# Patient Record
Sex: Female | Born: 1990 | Race: Black or African American | Hispanic: No | Marital: Single | Smoking: Never smoker
Health system: Southern US, Community
[De-identification: ages and names within clinical notes are randomized; demographics above are authoritative.]

## PROBLEM LIST (undated history)

## (undated) ENCOUNTER — Inpatient Hospital Stay (HOSPITAL_COMMUNITY): Payer: Self-pay

## (undated) DIAGNOSIS — N83209 Unspecified ovarian cyst, unspecified side: Secondary | ICD-10-CM

## (undated) DIAGNOSIS — A549 Gonococcal infection, unspecified: Secondary | ICD-10-CM

## (undated) DIAGNOSIS — O1490 Unspecified pre-eclampsia, unspecified trimester: Secondary | ICD-10-CM

## (undated) DIAGNOSIS — O09299 Supervision of pregnancy with other poor reproductive or obstetric history, unspecified trimester: Secondary | ICD-10-CM

## (undated) DIAGNOSIS — D75839 Thrombocytosis, unspecified: Secondary | ICD-10-CM

## (undated) DIAGNOSIS — K219 Gastro-esophageal reflux disease without esophagitis: Secondary | ICD-10-CM

## (undated) DIAGNOSIS — D473 Essential (hemorrhagic) thrombocythemia: Secondary | ICD-10-CM

## (undated) HISTORY — PX: NO PAST SURGERIES: SHX2092

## (undated) HISTORY — DX: Essential (hemorrhagic) thrombocythemia: D47.3

## (undated) HISTORY — DX: Thrombocytosis, unspecified: D75.839

---

## 2010-02-21 ENCOUNTER — Emergency Department (HOSPITAL_COMMUNITY)
Admission: EM | Admit: 2010-02-21 | Discharge: 2010-02-21 | Disposition: A | Discharge status: Home / Self Care | Payer: Self-pay | Attending: Emergency Medicine | Admitting: Emergency Medicine

## 2010-02-21 ENCOUNTER — Emergency Department (HOSPITAL_COMMUNITY): Payer: Self-pay

## 2010-02-21 DIAGNOSIS — R0602 Shortness of breath: Secondary | ICD-10-CM | POA: Insufficient documentation

## 2010-02-21 DIAGNOSIS — N390 Urinary tract infection, site not specified: Secondary | ICD-10-CM | POA: Insufficient documentation

## 2010-02-21 DIAGNOSIS — R079 Chest pain, unspecified: Secondary | ICD-10-CM | POA: Insufficient documentation

## 2010-02-21 DIAGNOSIS — R42 Dizziness and giddiness: Secondary | ICD-10-CM | POA: Insufficient documentation

## 2010-02-21 DIAGNOSIS — R55 Syncope and collapse: Secondary | ICD-10-CM | POA: Insufficient documentation

## 2010-02-21 DIAGNOSIS — M79609 Pain in unspecified limb: Secondary | ICD-10-CM | POA: Insufficient documentation

## 2010-02-21 LAB — CBC
Hemoglobin: 12.6 g/dL (ref 12.0–15.0)
Platelets: 456 10*3/uL — ABNORMAL HIGH (ref 150–400)
RBC: 4.66 MIL/uL (ref 3.87–5.11)
WBC: 7.3 10*3/uL (ref 4.0–10.5)

## 2010-02-21 LAB — URINE MICROSCOPIC-ADD ON

## 2010-02-21 LAB — DIFFERENTIAL
Basophils Absolute: 0 10*3/uL (ref 0.0–0.1)
Basophils Relative: 0 % (ref 0–1)
Eosinophils Absolute: 0.1 10*3/uL (ref 0.0–0.7)
Neutro Abs: 4.3 10*3/uL (ref 1.7–7.7)
Neutrophils Relative %: 59 % (ref 43–77)

## 2010-02-21 LAB — D-DIMER, QUANTITATIVE: D-Dimer, Quant: 0.22 ug/mL-FEU (ref 0.00–0.48)

## 2010-02-21 LAB — URINALYSIS, ROUTINE W REFLEX MICROSCOPIC
Bilirubin Urine: NEGATIVE
Nitrite: NEGATIVE
Specific Gravity, Urine: 1.027 (ref 1.005–1.030)
Urobilinogen, UA: 0.2 mg/dL (ref 0.0–1.0)

## 2010-02-21 LAB — POCT I-STAT, CHEM 8
Calcium, Ion: 1.28 mmol/L (ref 1.12–1.32)
Chloride: 104 mEq/L (ref 96–112)
HCT: 42 % (ref 36.0–46.0)
Sodium: 136 mEq/L (ref 135–145)
TCO2: 24 mmol/L (ref 0–100)

## 2010-02-21 LAB — POCT PREGNANCY, URINE: Preg Test, Ur: NEGATIVE

## 2010-02-22 LAB — URINE CULTURE

## 2010-02-24 ENCOUNTER — Emergency Department (HOSPITAL_BASED_OUTPATIENT_CLINIC_OR_DEPARTMENT_OTHER)
Admission: EM | Admit: 2010-02-24 | Discharge: 2010-02-24 | Disposition: A | Discharge status: Home / Self Care | Payer: Self-pay | Attending: Emergency Medicine | Admitting: Emergency Medicine

## 2010-02-24 DIAGNOSIS — F410 Panic disorder [episodic paroxysmal anxiety] without agoraphobia: Secondary | ICD-10-CM | POA: Insufficient documentation

## 2010-08-29 ENCOUNTER — Emergency Department (HOSPITAL_COMMUNITY): Payer: Self-pay

## 2010-08-29 ENCOUNTER — Emergency Department (HOSPITAL_COMMUNITY)
Admission: EM | Admit: 2010-08-29 | Discharge: 2010-08-30 | Disposition: A | Discharge status: Home / Self Care | Payer: Self-pay | Attending: Emergency Medicine | Admitting: Emergency Medicine

## 2010-08-29 DIAGNOSIS — X58XXXA Exposure to other specified factors, initial encounter: Secondary | ICD-10-CM | POA: Insufficient documentation

## 2010-08-29 DIAGNOSIS — M25519 Pain in unspecified shoulder: Secondary | ICD-10-CM | POA: Insufficient documentation

## 2010-08-29 DIAGNOSIS — IMO0002 Reserved for concepts with insufficient information to code with codable children: Secondary | ICD-10-CM | POA: Insufficient documentation

## 2010-10-24 ENCOUNTER — Emergency Department (HOSPITAL_COMMUNITY): Payer: Self-pay

## 2010-10-24 ENCOUNTER — Emergency Department (HOSPITAL_COMMUNITY)
Admission: EM | Admit: 2010-10-24 | Discharge: 2010-10-25 | Disposition: A | Discharge status: Home / Self Care | Payer: Self-pay | Attending: Emergency Medicine | Admitting: Emergency Medicine

## 2010-10-24 DIAGNOSIS — R079 Chest pain, unspecified: Secondary | ICD-10-CM | POA: Insufficient documentation

## 2010-10-24 DIAGNOSIS — R55 Syncope and collapse: Secondary | ICD-10-CM | POA: Insufficient documentation

## 2010-10-24 DIAGNOSIS — F411 Generalized anxiety disorder: Secondary | ICD-10-CM | POA: Insufficient documentation

## 2010-10-25 LAB — POCT PREGNANCY, URINE: Preg Test, Ur: NEGATIVE

## 2011-04-24 ENCOUNTER — Emergency Department (INDEPENDENT_AMBULATORY_CARE_PROVIDER_SITE_OTHER)
Admission: EM | Admit: 2011-04-24 | Discharge: 2011-04-24 | Disposition: A | Discharge status: Home / Self Care | Payer: Self-pay | Source: Home / Self Care | Attending: Emergency Medicine | Admitting: Emergency Medicine

## 2011-04-24 ENCOUNTER — Encounter (HOSPITAL_COMMUNITY): Payer: Self-pay | Admitting: Emergency Medicine

## 2011-04-24 DIAGNOSIS — IMO0001 Reserved for inherently not codable concepts without codable children: Secondary | ICD-10-CM

## 2011-04-24 DIAGNOSIS — IMO0002 Reserved for concepts with insufficient information to code with codable children: Secondary | ICD-10-CM

## 2011-04-24 DIAGNOSIS — E1165 Type 2 diabetes mellitus with hyperglycemia: Secondary | ICD-10-CM

## 2011-04-24 DIAGNOSIS — K529 Noninfective gastroenteritis and colitis, unspecified: Secondary | ICD-10-CM

## 2011-04-24 DIAGNOSIS — K5289 Other specified noninfective gastroenteritis and colitis: Secondary | ICD-10-CM

## 2011-04-24 HISTORY — DX: Gastro-esophageal reflux disease without esophagitis: K21.9

## 2011-04-24 LAB — POCT I-STAT, CHEM 8
Calcium, Ion: 1.24 mmol/L (ref 1.12–1.32)
Glucose, Bld: 198 mg/dL — ABNORMAL HIGH (ref 70–99)
HCT: 38 % (ref 36.0–46.0)
Hemoglobin: 12.9 g/dL (ref 12.0–15.0)
Potassium: 3.9 mEq/L (ref 3.5–5.1)

## 2011-04-24 LAB — POCT URINALYSIS DIP (DEVICE)
Bilirubin Urine: NEGATIVE
Nitrite: NEGATIVE
pH: 7 (ref 5.0–8.0)

## 2011-04-24 MED ORDER — CIPROFLOXACIN HCL 500 MG PO TABS: 500.0000 mg | ORAL_TABLET | Freq: Two times a day (BID) | ORAL | Status: AC

## 2011-04-24 MED ORDER — GLIMEPIRIDE 1 MG PO TABS: 1.0000 mg | ORAL_TABLET | Freq: Every day | ORAL | Status: DC

## 2011-04-24 NOTE — ED Provider Notes (Signed)
Chief Complaint  Patient presents with  . Abdominal Pain  . GI Problem    History of Present Illness:   Julia Willis is a 21 year old female who has had a one-month history of generalized abdominal pain and diarrhea. The pain comes and goes and can last for 5-10 minutes at a time. It is rated as 7/10 in intensity. It feels like a stabbing or sharp. It's been worse the past week. The pain is generalized and is not local localized. It is worse after eating and better after a bowel movement or emptying her bladder. Also for the past week she's felt nauseated and vomited once. No blood or bilious vomitus. Her appetite is good and her weight is stable. She's had a one-month history of diarrheal stools with about 3 loose stools per day without blood or mucus. No melena. She denies any fevers, chills, anorexia, or weight loss. She has had no urinary symptoms. No GYN symptoms. Last menstrual period was March 20. She is sexually active and uses condoms although not consistently. She has had no suspicious ingestions, foreign travel, animal exposure, recent antibiotic use.  There is a family history of diabetes. She denies any polyuria, polydipsia, blurry vision, drastic changes in her weight issue of weight loss or weight gain, skin problems, or vaginitis.  Review of Systems:  Other than noted above, the patient denies any of the following symptoms: Constitutional:  No fever, chills, fatigue, weight loss or anorexia. Lungs:  No cough or shortness of breath. Heart:  No chest pain, palpitations, syncope or edema. Abdomen:  No nausea, vomiting, hematememesis, melena, diarrhea, or hematochezia. GU:  No dysuria, frequency, urgency, or hematuria. Gyn:  No vaginal discharge, itching, abnormal bleeding or pelvic pain. Skin:  No rash or itching.  PMFSH:  Past medical history, family history, social history, meds, and allergies were reviewed.  Physical Exam:   Vital signs:  BP 133/84  Pulse 78  Temp(Src) 97.8 F (36.6  C) (Oral)  Resp 14  SpO2 100%  LMP 03/29/2011 Gen:  Alert, oriented, in no distress. Lungs:  Breath sounds clear and equal bilaterally.  No wheezes, rales or rhonchi. Heart:  Regular rhythm.  No gallops or murmers.   Abdomen:  Abdomen is flat, soft, nondistended she has mild generalized tenderness to palpation without guarding or rebound. There is no localizing tenderness to palpation. No organomegaly or mass. Bowel sounds are hyperactive. Skin:  Clear, warm and dry.  No rash.  Labs:   Results for orders placed during the hospital encounter of 04/24/11  POCT URINALYSIS DIP (DEVICE)      Component Value Range   Glucose, UA >=1000 (*) NEGATIVE (mg/dL)   Bilirubin Urine NEGATIVE  NEGATIVE    Ketones, ur NEGATIVE  NEGATIVE (mg/dL)   Specific Gravity, Urine 1.020  1.005 - 1.030    Hgb urine dipstick SMALL (*) NEGATIVE    pH 7.0  5.0 - 8.0    Protein, ur NEGATIVE  NEGATIVE (mg/dL)   Urobilinogen, UA 2.0 (*) 0.0 - 1.0 (mg/dL)   Nitrite NEGATIVE  NEGATIVE    Leukocytes, UA TRACE (*) NEGATIVE   POCT PREGNANCY, URINE      Component Value Range   Preg Test, Ur NEGATIVE  NEGATIVE   POCT I-STAT, CHEM 8      Component Value Range   Sodium 138  135 - 145 (mEq/L)   Potassium 3.9  3.5 - 5.1 (mEq/L)   Chloride 102  96 - 112 (mEq/L)   BUN 5 (*) 6 -  23 (mg/dL)   Creatinine, Ser 1.61  0.50 - 1.10 (mg/dL)   Glucose, Bld 096 (*) 70 - 99 (mg/dL)   Calcium, Ion 0.45  4.09 - 1.32 (mmol/L)   TCO2 26  0 - 100 (mmol/L)   Hemoglobin 12.9  12.0 - 15.0 (g/dL)   HCT 81.1  91.4 - 78.2 (%)    Other Labs Obtained at Urgent Care Center:  A stool culture will be obtained as well as Clostridium difficile PCR.  Results are pending at this time and we will call about any positive results.  Assessment:  The primary encounter diagnosis was Colitis. A diagnosis of Diabetes mellitus type 2, uncontrolled was also pertinent to this visit.  I think she has either an infectious colitis caused by bacterial infection such  as Salmonella, Shigella, Escherichia coli, Yersinia, or Campylobacter, or inflammatory bowel disease. Right now her diabetes is asymptomatic and is an incidental finding and unrelated to her current symptoms.  Plan:   1.  The following meds were prescribed:   New Prescriptions   CIPROFLOXACIN (CIPRO) 500 MG TABLET    Take 1 tablet (500 mg total) by mouth every 12 (twelve) hours.   GLIMEPIRIDE (AMARYL) 1 MG TABLET    Take 1 tablet (1 mg total) by mouth daily before breakfast.   2.  The patient was instructed in symptomatic care and handouts were given. 3.  The patient was told to return if becoming worse in any way, if no better in 3 or 4 days, and given some red flag symptoms that would indicate earlier return.  Follow up:  The patient was told to follow up with Dr. Loreta Ave if her abdominal pain and diarrhea have not completely gone away in 10 days. She was also urged to follow up with a primary care physician as soon as possible with regard to her diabetes. She was told to avoid sugar, sweets, and starch foods in the meantime.    Reuben Likes, MD 04/24/11 [phone]

## 2011-04-24 NOTE — Discharge Instructions (Signed)

## 2011-04-24 NOTE — ED Notes (Signed)
PT HERE WITH SHARP SHOOTING INTERMITT PAIN IN MIDDLE ABD RADIATING TO LOWER AREA X 1WEEK WITH N/V/D.DENIES VAG D/C OR UTI SX.PT STATES AFTER EATING STOMACH QUEASY WITH VOMITING.HX ACID REFLUX.LMP 03/29/11

## 2011-04-24 NOTE — ED Notes (Signed)
Patient unable to give urine sample;  Pt given cup of water 

## 2011-06-10 DIAGNOSIS — A549 Gonococcal infection, unspecified: Secondary | ICD-10-CM

## 2011-06-10 HISTORY — DX: Gonococcal infection, unspecified: A54.9

## 2011-07-12 ENCOUNTER — Emergency Department (HOSPITAL_COMMUNITY)
Admission: EM | Admit: 2011-07-12 | Discharge: 2011-07-13 | Disposition: A | Discharge status: Home / Self Care | Payer: Self-pay | Attending: Emergency Medicine | Admitting: Emergency Medicine

## 2011-07-12 ENCOUNTER — Encounter (HOSPITAL_COMMUNITY): Payer: Self-pay | Admitting: *Deleted

## 2011-07-12 DIAGNOSIS — Z202 Contact with and (suspected) exposure to infections with a predominantly sexual mode of transmission: Secondary | ICD-10-CM | POA: Insufficient documentation

## 2011-07-12 DIAGNOSIS — A64 Unspecified sexually transmitted disease: Secondary | ICD-10-CM

## 2011-07-12 DIAGNOSIS — A599 Trichomoniasis, unspecified: Secondary | ICD-10-CM

## 2011-07-12 DIAGNOSIS — Z79899 Other long term (current) drug therapy: Secondary | ICD-10-CM | POA: Insufficient documentation

## 2011-07-12 DIAGNOSIS — E119 Type 2 diabetes mellitus without complications: Secondary | ICD-10-CM | POA: Insufficient documentation

## 2011-07-12 LAB — POCT PREGNANCY, URINE: Preg Test, Ur: NEGATIVE

## 2011-07-12 MED ORDER — CEFTRIAXONE SODIUM 250 MG IJ SOLR: 250.0000 mg | Freq: Once | INTRAMUSCULAR | Status: DC

## 2011-07-12 MED ORDER — AZITHROMYCIN 250 MG PO TABS: 1000.0000 mg | ORAL_TABLET | Freq: Once | ORAL | Status: DC

## 2011-07-12 NOTE — ED Notes (Signed)
Pt refusing medications.She is requesting to go home and follow up with her doctor for investigations and test.

## 2011-07-12 NOTE — ED Provider Notes (Signed)
History     CSN: [id number]  Arrival date & time 07/12/11  1927   First MD Initiated Contact with Patient 07/12/11 2218      Chief Complaint  Patient presents with  . Exposure to STD    (Consider location/radiation/quality/duration/timing/severity/associated sxs/prior treatment) Patient is a 21 y.o. female presenting with STD exposure. The history is provided by the patient. No language interpreter was used.  Exposure to STD This is a new problem. The current episode started today. Pertinent negatives include no fever, nausea, swollen glands or vomiting. Nothing aggravates the symptoms. She has tried nothing for the symptoms.   Exposed to gonorrhea 2 weeks ago. Would like treatment. [facility].  Past Medical History  Diagnosis Date  . Acid reflux   . Diabetes mellitus     History reviewed. No pertinent past surgical history.  No family history on file.  History  Substance Use Topics  . Smoking status: Never Smoker   . Smokeless tobacco: Not on file  . Alcohol Use: Yes    OB History    Grav Para Term Preterm Abortions TAB SAB Ect Mult Living                  Review of Systems  Constitutional: Negative.  Negative for fever.  HENT: Negative.   Eyes: Negative.   Respiratory: Negative.   Cardiovascular: Negative.   Gastrointestinal: Negative.  Negative for nausea and vomiting.  Genitourinary: Negative for dysuria, urgency, frequency, hematuria, flank pain, vaginal bleeding, vaginal discharge, difficulty urinating, genital sores and pelvic pain.  Neurological: Negative.   Psychiatric/Behavioral: Negative.   All other systems reviewed and are negative.    Allergies  Review of patient's allergies indicates no known allergies.  Home Medications   Current Outpatient Rx  Name Route Sig Dispense Refill  . METFORMIN HCL 500 MG PO TABS Oral Take 500 mg by mouth 2 (two) times daily with a meal.      BP 131/97  Pulse 79  Temp 97.9 F (36.6 C) (Oral)   Resp 16  SpO2 100%  LMP 07/06/2011  Physical Exam  Nursing note and vitals reviewed. Constitutional: She is oriented to person, place, and time. She appears well-developed and well-nourished.  HENT:  Head: Normocephalic and atraumatic.  Eyes: Conjunctivae and EOM are normal. Pupils are equal, round, and reactive to light.  Neck: Normal range of motion. Neck supple.  Cardiovascular: Normal rate.   Pulmonary/Chest: Effort normal.  Abdominal: Soft.  Genitourinary: There is erythema and tenderness around the vagina. Vaginal discharge found.  Musculoskeletal: Normal range of motion. She exhibits no edema and no tenderness.  Neurological: She is alert and oriented to person, place, and time. She has normal reflexes.  Skin: Skin is warm and dry.  Psychiatric: She has a normal mood and affect.    ED Course  Pelvic exam Performed by: Remi Haggard Authorized by: Remi Haggard  Pelvic exam Date/Time: 07/12/2011 11:33 PM Performed by: Remi Haggard Authorized by: Remi Haggard Consent: Verbal consent obtained. Risks and benefits: risks, benefits and alternatives were discussed Consent given by: patient Patient understanding: patient states understanding of the procedure being performed Patient identity confirmed: verbally with patient, arm band and provided demographic data Local anesthesia used: no Patient sedated: no Patient tolerance: Patient tolerated the procedure well with no immediate complications.   (including critical care time) Patient is very undecided about what she wants done in the ER today. States that she was exposed to gonorrhea but does not want  to be treated for it. She stated she did not want a pelvic exam she just wanted to be treated now she saying she doesn't want to be treated until she has a pelvic exam. The test came back positive for Trichomonas. She did agree to being treated for Trichomonas but does not want to be treated for gonorrhea. States that  she'll wait for the test to come back before she was treated for anything else. Labs Reviewed - No data to display No results found.   No diagnosis found.    MDM  Treated for std trich only at patients request.  gc chlamydia pending. Will get call if+.  [facility] days.  Refused Rocephen and azith/doxy rx.     Labs Reviewed  WET PREP, GENITAL - Abnormal; Notable for the following:    Yeast Wet Prep HPF POC FEW (*)     Trich, Wet Prep MANY (*)     Clue Cells Wet Prep HPF POC FEW (*)     WBC, Wet Prep HPF POC FEW (*)     All other components within normal limits  POCT PREGNANCY, URINE  GC/CHLAMYDIA PROBE AMP, GENITAL         Remi Haggard, NP 07/13/11 1836

## 2011-07-12 NOTE — ED Notes (Addendum)
Denies sx. Reports was told she may have been exposed to gonorrhea. Wants to be checked. (Denies: pain, urinary sx vaginal sx, bleeding, fever, nvd, dizziness, itching, abd pain, back pain or any other sx).

## 2011-07-12 NOTE — ED Notes (Signed)
Pt went to general waiting after I spoke with her

## 2011-07-13 LAB — WET PREP, GENITAL

## 2011-07-13 MED ORDER — METRONIDAZOLE 500 MG PO TABS
2000.0000 mg | ORAL_TABLET | Freq: Once | ORAL | Status: AC
  Administered 2011-07-13: 2000 mg via ORAL
  Filled 2011-07-13: qty 1
  Filled 2011-07-13: qty 3

## 2011-07-13 MED ORDER — ONDANSETRON 4 MG PO TBDP
4.0000 mg | ORAL_TABLET | Freq: Once | ORAL | Status: AC
  Administered 2011-07-13: 4 mg via ORAL
  Filled 2011-07-13: qty 1

## 2011-07-13 NOTE — ED Notes (Signed)
Pt discharged home.GCS 15 

## 2011-07-17 NOTE — ED Provider Notes (Signed)
Medical screening examination/treatment/procedure(s) were performed by non-physician practitioner and as supervising physician I was immediately available for consultation/collaboration.  Geoffery Lyons, MD 07/17/11 [phone]

## 2011-07-18 ENCOUNTER — Encounter (HOSPITAL_COMMUNITY): Payer: Self-pay | Admitting: *Deleted

## 2011-07-18 ENCOUNTER — Emergency Department (HOSPITAL_COMMUNITY)
Admission: EM | Admit: 2011-07-18 | Discharge: 2011-07-19 | Disposition: A | Discharge status: Home / Self Care | Payer: Self-pay | Attending: Emergency Medicine | Admitting: Emergency Medicine

## 2011-07-18 DIAGNOSIS — A64 Unspecified sexually transmitted disease: Secondary | ICD-10-CM | POA: Insufficient documentation

## 2011-07-18 DIAGNOSIS — E119 Type 2 diabetes mellitus without complications: Secondary | ICD-10-CM | POA: Insufficient documentation

## 2011-07-18 DIAGNOSIS — Z794 Long term (current) use of insulin: Secondary | ICD-10-CM | POA: Insufficient documentation

## 2011-07-18 DIAGNOSIS — A549 Gonococcal infection, unspecified: Secondary | ICD-10-CM

## 2011-07-18 DIAGNOSIS — K219 Gastro-esophageal reflux disease without esophagitis: Secondary | ICD-10-CM | POA: Insufficient documentation

## 2011-07-18 DIAGNOSIS — A5400 Gonococcal infection of lower genitourinary tract, unspecified: Secondary | ICD-10-CM | POA: Insufficient documentation

## 2011-07-18 DIAGNOSIS — N898 Other specified noninflammatory disorders of vagina: Secondary | ICD-10-CM | POA: Insufficient documentation

## 2011-07-18 HISTORY — DX: Gonococcal infection, unspecified: A54.9

## 2011-07-18 LAB — GC/CHLAMYDIA PROBE AMP, GENITAL: GC Probe Amp, Genital: POSITIVE — AB

## 2011-07-18 NOTE — ED Notes (Signed)
Pt was seen here last week for STD testing, called today and was told it was positive for gonorrhea.  Itching and redness in vaginal area.

## 2011-07-18 NOTE — ED Notes (Signed)
Contact phone number is [phone]. Informed of positive gc culture results; instructed to return here, go to health dept, or we will notify her re: treatment.

## 2011-07-19 MED ORDER — CEFTRIAXONE SODIUM 250 MG IJ SOLR
250.0000 mg | Freq: Once | INTRAMUSCULAR | Status: AC
  Administered 2011-07-19: 250 mg via INTRAMUSCULAR
  Filled 2011-07-19: qty 250

## 2011-07-19 NOTE — ED Provider Notes (Signed)
History     CSN: [id number]  Arrival date & time 07/18/11  2253   First MD Initiated Contact with Patient 07/19/11 0124      Chief Complaint  Patient presents with  . Exposure to STD    HPI  History provided by the patient. Patient is a 21 year old female with history of diabetes who presents with requests for treatment for gonorrhea infection. Patient was seen last week for concerns of gonorrhea exposure after being called by a partner with positive test. Patient was seen and screened for this in the emergency department. At that time she was treated for Trichomonas infection with Flagyl. She is not treated for any other STDs. Patient states that she call back to the hospital and found out that her gonorrhea test was positive and was told that she should come back for treatment or followup with the [facility] can help her. Patient states that she has still noticed some vaginal discharge and discomfort but this has improved slightly after treatment with Flagyl last week. She denies any other new symptoms. Denies any fever, chills, sweats, nausea vomiting.    Past Medical History  Diagnosis Date  . Acid reflux   . Diabetes mellitus   . Gonorrhea     History reviewed. No pertinent past surgical history.  History reviewed. No pertinent family history.  History  Substance Use Topics  . Smoking status: Never Smoker   . Smokeless tobacco: Not on file  . Alcohol Use: Yes    OB History    Grav Para Term Preterm Abortions TAB SAB Ect Mult Living                  Review of Systems  Constitutional: Negative for fever and chills.  Gastrointestinal: Negative for nausea, vomiting and abdominal pain.  Genitourinary: Positive for vaginal discharge. Negative for dysuria, frequency, hematuria, flank pain and vaginal bleeding.    Allergies  Review of patient's allergies indicates no known allergies.  Home Medications   Current Outpatient Rx  Name Route Sig Dispense Refill  .  METFORMIN HCL 500 MG PO TABS Oral Take 500 mg by mouth 2 (two) times daily with a meal.      BP 144/100  Pulse 89  Temp 98.3 F (36.8 C) (Oral)  SpO2 99%  LMP 07/06/2011  Physical Exam  Nursing note and vitals reviewed. Constitutional: She is oriented to person, place, and time. She appears well-developed and well-nourished. No distress.  HENT:  Head: Normocephalic.  Cardiovascular: Normal rate and regular rhythm.   Pulmonary/Chest: Effort normal and breath sounds normal.  Neurological: She is alert and oriented to person, place, and time.  Psychiatric: She has a normal mood and affect. Her behavior is normal.    ED Course  Procedures      1. Gonorrhea   2. Sexually transmitted disease       MDM  2:00AM patient seen and evaluated. Patient no acute distress. Patient given dose of ceftriaxone 250 mg IM to treat gonorrhea. Patient advised to followup with the [facility] department STD clinic for additional evaluation and STD screening. Patient expressed her understanding and plans to followup on Monday.        Angus Seller, [place] 07/19/11 [phone]

## 2011-07-19 NOTE — ED Notes (Signed)
+  Gonorrhea Patient treated with Rocephin-DHHS letter faxed 

## 2011-07-20 NOTE — ED Provider Notes (Signed)
Medical screening examination/treatment/procedure(s) were performed by non-physician practitioner and as supervising physician I was immediately available for consultation/collaboration.  Cyndra Numbers, MD 07/20/11 [phone]

## 2011-09-03 ENCOUNTER — Encounter (HOSPITAL_COMMUNITY): Payer: Self-pay | Admitting: Emergency Medicine

## 2011-09-03 ENCOUNTER — Emergency Department (INDEPENDENT_AMBULATORY_CARE_PROVIDER_SITE_OTHER)
Admission: EM | Admit: 2011-09-03 | Discharge: 2011-09-03 | Disposition: A | Discharge status: Home / Self Care | Payer: Self-pay | Source: Home / Self Care | Attending: Emergency Medicine | Admitting: Emergency Medicine

## 2011-09-03 DIAGNOSIS — B9689 Other specified bacterial agents as the cause of diseases classified elsewhere: Secondary | ICD-10-CM

## 2011-09-03 DIAGNOSIS — N760 Acute vaginitis: Secondary | ICD-10-CM

## 2011-09-03 DIAGNOSIS — A6009 Herpesviral infection of other urogenital tract: Secondary | ICD-10-CM

## 2011-09-03 DIAGNOSIS — A6000 Herpesviral infection of urogenital system, unspecified: Secondary | ICD-10-CM

## 2011-09-03 DIAGNOSIS — A499 Bacterial infection, unspecified: Secondary | ICD-10-CM

## 2011-09-03 LAB — POCT URINALYSIS DIP (DEVICE)
Bilirubin Urine: NEGATIVE
Glucose, UA: NEGATIVE mg/dL
Nitrite: NEGATIVE

## 2011-09-03 LAB — WET PREP, GENITAL
Trich, Wet Prep: NONE SEEN
Yeast Wet Prep HPF POC: NONE SEEN

## 2011-09-03 LAB — RPR: RPR Ser Ql: NONREACTIVE

## 2011-09-03 MED ORDER — METRONIDAZOLE 500 MG PO TABS: 500.0000 mg | ORAL_TABLET | Freq: Two times a day (BID) | ORAL | Status: AC

## 2011-09-03 MED ORDER — ACYCLOVIR 400 MG PO TABS: 400.0000 mg | ORAL_TABLET | ORAL | Status: AC

## 2011-09-03 NOTE — ED Notes (Signed)
Pt has vaginal pain and discharge since 8-17.

## 2011-09-03 NOTE — ED Provider Notes (Signed)
Chief Complaint  Patient presents with  . Vaginal Discharge    History of Present Illness:   Julia Willis is a 21 year old female who has had a one-week history of vulvar pain at the midline, just outside the entrance to the vagina, posteriorly. There is redness and slight itching. She also has a white discharge with odor. She notes pain radiating down the left leg. It burns somewhat to urinate. She's never had anything like this before. She's not been exposed to any STDs and her boyfriend does not have any ulcers on his penis. Her last menstrual period was July 29. She is sexually active with consistent use of condoms. She has a history of diabetes that was diagnosed when she was here for episode of colitis in the spring. She's on metformin and being followed by a primary care physician in [facility].  Review of Systems:  Other than noted above, the patient denies any of the following symptoms: Systemic:  No fever, chills, sweats, fatigue, or weight loss. GI:  No abdominal pain, nausea, anorexia, vomiting, diarrhea, constipation, melena or hematochezia. GU:  No dysuria, frequency, urgency, hematuria, vaginal discharge, itching, or abnormal vaginal bleeding. Skin:  No rash or itching.   PMFSH:  Past medical history, family history, social history, meds, and allergies were reviewed.  Physical Exam:   Vital signs:  BP 135/87  Pulse 75  Temp 98.1 F (36.7 C) (Oral)  Resp 18  SpO2 100% General:  Alert, oriented and in no distress. Lungs:  Breath sounds clear and equal bilaterally.  No wheezes, rales or rhonchi. Heart:  Regular rhythm.  No gallops or murmers. Abdomen:  Soft, flat and non-distended.  No organomegaly or mass.  No tenderness, guarding or rebound.  Bowel sounds normally active. Pelvic exam:  She has also lesions on the vulva just posterior to the vaginal opening. These were tender to touch. There was a small amount of discharge visible externally. Internally on speculum exam is also a  moderate amount of white discharge which was malodorous. Cervix appears normal with the exception of a small amount of mucoid drainage. No pain on cervical motion. Uterus normal in size and nontender. No adnexal masses or tenderness.  Skin:  Clear, warm and dry.  Labs:   Results for orders placed during the hospital encounter of 09/03/11  POCT URINALYSIS DIP (DEVICE)      Component Value Range   Glucose, UA NEGATIVE  NEGATIVE mg/dL   Bilirubin Urine NEGATIVE  NEGATIVE   Ketones, ur NEGATIVE  NEGATIVE mg/dL   Specific Gravity, Urine 1.020  1.005 - 1.030   Hgb urine dipstick MODERATE (*) NEGATIVE   pH 5.0  5.0 - 8.0   Protein, ur NEGATIVE  NEGATIVE mg/dL   Urobilinogen, UA 0.2  0.0 - 1.0 mg/dL   Nitrite NEGATIVE  NEGATIVE   Leukocytes, UA MODERATE (*) NEGATIVE  POCT PREGNANCY, URINE      Component Value Range   Preg Test, Ur NEGATIVE  NEGATIVE  WET PREP, GENITAL      Component Value Range   Yeast Wet Prep HPF POC NONE SEEN  NONE SEEN   Trich, Wet Prep NONE SEEN  NONE SEEN   Clue Cells Wet Prep HPF POC FEW (*) NONE SEEN   WBC, Wet Prep HPF POC FEW (*) NONE SEEN    Other Labs Obtained at Urgent Care Center:  Also obtained were herpes simplex culture, HSV-2 IgG antibody, and GC and Chlamydia DNA probes.  Results are pending at this time  and we will call about any positive results.  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 A diagnosis of Bacterial vaginosis was also pertinent to this visit.  Plan:   1.  The following meds were prescribed:   New Prescriptions   ACYCLOVIR (ZOVIRAX) 400 MG TABLET    Take 1 tablet (400 mg total) by mouth every 4 (four) hours while awake.   METRONIDAZOLE (FLAGYL) 500 MG TABLET    Take 1 tablet (500 mg total) by mouth 2 (two) times daily.   2.  The patient was instructed in symptomatic care and handouts were given. 3.  The patient was told to return if becoming worse in any way, if no better in 3 or 4 days, and  given some red flag symptoms that would indicate earlier return.    Reuben Likes, MD 09/03/11 1515

## 2011-09-03 NOTE — Discharge Instructions (Signed)
Genital Herpes Genital herpes is a sexually transmitted disease. This means that it is a disease passed by having sex with an infected person. There is no cure for genital herpes. The time between attacks can be months to years. The virus may live in a person but produce no problems (symptoms). This infection can be passed to a baby as it travels down the birth canal (vagina). In a newborn, this can cause central nervous system damage, eye damage, or even death. The virus that causes genital herpes is usually HSV-2 virus. The virus that causes oral herpes is usually HSV-1. The diagnosis (learning what is wrong) is made through culture results. SYMPTOMS  Usually symptoms of pain and itching begin a few days to a week after contact. It first appears as small blisters that progress to small painful ulcers which then scab over and heal after several days. It affects the outer genitalia, birth canal, cervix, penis, anal area, buttocks, and thighs. HOME CARE INSTRUCTIONS   Keep ulcerated areas dry and clean.   Take medications as directed. Antiviral medications can speed up healing. They will not prevent recurrences or cure this infection. These medications can also be taken for suppression if there are frequent recurrences.   While the infection is active, it is contagious. Avoid all sexual contact during active infections.   Condoms may help prevent spread of the herpes virus.   Practice safe sex.   Wash your hands thoroughly after touching the genital area.   Avoid touching your eyes after touching your genital area.   Inform your caregiver if you have had genital herpes and become pregnant. It is your responsibility to insure a safe outcome for your baby in this pregnancy.   Only take over-the-counter or prescription medicines for pain, discomfort, or fever as directed by your caregiver.  SEEK MEDICAL CARE IF:   You have a recurrence of this infection.   You do not respond to medications and  are not improving.   You have new sources of pain or discharge which have changed from the original infection.   You have an oral temperature above 102 F (38.9 C).   You develop abdominal pain.   You develop eye pain or signs of eye infection.  Document Released: 12/24/1999 Document Revised: 12/15/2010 Document Reviewed: 01/13/2009 [facility] Patient Information 2012 [place], [place].  You have been diagnosed with a possible STD.  Your results should be back in 3 days.  You can call us here at [phone] and ask for [facility].  She can tell you whether or not your results are back, but you must come here to get your results.  We do this to protect our patients' confidentiality.  You can come Monday through Friday and tell the receptionist that your are just here to get test results.  In the meantime, you should avoid intercourse altogether for 1 week.  After that, you should always use condoms--100% of the time.  This will not only prevent pregnancy, but has been shown to prevent HIV, syphilis, gonorrhea, chlamydia, hepatis C and other STDs.  If your test comes back positive, we are required by law to report it to the Health Department.  We also suggest you inform your partner or partners so they can get tested and treated as well.

## 2011-09-04 LAB — GC/CHLAMYDIA PROBE AMP, GENITAL
Chlamydia, DNA Probe: NEGATIVE
GC Probe Amp, Genital: NEGATIVE

## 2011-09-06 ENCOUNTER — Telehealth (HOSPITAL_COMMUNITY): Payer: Self-pay | Admitting: *Deleted

## 2011-09-06 NOTE — ED Notes (Signed)
GC/Chlamydia neg., Wet prep: few clue cells, few WBC's, HIV/RPR non-reactive, HSV 2 Glycoprotein 6.26 H, Herpes culture: Herpes Simplex Type 2 detected. I called pt.  Pt. verified x 2 and given results.  Pt. told she was adequately treated with Acyclovir and to finish all of her medication.  She has 2 refills and should take medication for each future outbreak.  Pt. instructed to notify her partner. You can pass the virus even when you don't have an outbreak, so always practice safe sex. Get treated for each outbreak with Acyclovir or Valtrex. You may want to get an OB-GYN doctor who can call in a prescription for you when you have an outbreak or who will give a years Rx. to take as needed. Pt. voiced understanding. Vassie Moselle 09/06/2011

## 2011-10-28 ENCOUNTER — Emergency Department (HOSPITAL_COMMUNITY)
Admission: EM | Admit: 2011-10-28 | Discharge: 2011-10-29 | Disposition: A | Discharge status: Home / Self Care | Payer: Self-pay | Attending: Emergency Medicine | Admitting: Emergency Medicine

## 2011-10-28 ENCOUNTER — Encounter (HOSPITAL_COMMUNITY): Payer: Self-pay

## 2011-10-28 DIAGNOSIS — M545 Low back pain, unspecified: Secondary | ICD-10-CM | POA: Insufficient documentation

## 2011-10-28 DIAGNOSIS — E119 Type 2 diabetes mellitus without complications: Secondary | ICD-10-CM | POA: Insufficient documentation

## 2011-10-28 DIAGNOSIS — T148XXA Other injury of unspecified body region, initial encounter: Secondary | ICD-10-CM

## 2011-10-28 NOTE — ED Notes (Signed)
Patient currently resting quietly in bed; no respiratory or acute distress noted.  Patient updated on plan of care; informed patient that we are currently waiting on further orders from EDP/PA; patient has no other questions or concerns at this time; will continue to monitor.

## 2011-10-28 NOTE — ED Notes (Signed)
Pt reports lower back pain x1 week, pt denies injury to area, abnormal vaginal odor, d/c, or bleeding

## 2011-10-29 LAB — URINALYSIS, ROUTINE W REFLEX MICROSCOPIC
Bilirubin Urine: NEGATIVE
Ketones, ur: NEGATIVE mg/dL
Nitrite: NEGATIVE
Protein, ur: NEGATIVE mg/dL
Urobilinogen, UA: 0.2 mg/dL (ref 0.0–1.0)

## 2011-10-29 LAB — URINE MICROSCOPIC-ADD ON

## 2011-10-29 MED ORDER — DIAZEPAM 5 MG PO TABS
5.0000 mg | ORAL_TABLET | Freq: Once | ORAL | Status: AC
  Administered 2011-10-29: 5 mg via ORAL
  Filled 2011-10-29: qty 1

## 2011-10-29 MED ORDER — IBUPROFEN 400 MG PO TABS
800.0000 mg | ORAL_TABLET | Freq: Once | ORAL | Status: AC
  Administered 2011-10-29: 800 mg via ORAL
  Filled 2011-10-29: qty 2

## 2011-10-29 MED ORDER — DIAZEPAM 5 MG PO TABS: 5.0000 mg | ORAL_TABLET | Freq: Two times a day (BID) | ORAL | Status: DC

## 2011-10-29 MED ORDER — IBUPROFEN 800 MG PO TABS: 800.0000 mg | ORAL_TABLET | Freq: Three times a day (TID) | ORAL | Status: DC

## 2011-10-29 NOTE — ED Notes (Signed)
Patient given discharge paperwork; went over discharge instructions with patient.  Patient instructed to take prescribed Valium and ibuprofen as directed, instructed not to drive or drink while taking Valium, to follow up with referral, and to return to the ED for new, worsening, or concerning symptoms.

## 2011-10-29 NOTE — ED Notes (Signed)
I gave the patient a warm blanket. 

## 2011-10-29 NOTE — ED Notes (Addendum)
Patient currently resting quietly in bed; no respiratory or acute distress noted.  Patient updated on plan of care; informed patient that we are currently waiting for urine results to come back.  Denies any needs at this time; will continue to monitor.

## 2011-10-29 NOTE — ED Notes (Signed)
Patient currently resting quietly in bed; no respiratory or acute distress noted.  Patient updated on plan of care; informed patient that we are currently waiting on further orders from EDP/PA.  Patient has no other questions or concerns at this time; will continue to monitor. 

## 2011-10-29 NOTE — ED Provider Notes (Signed)
Medical screening examination/treatment/procedure(s) were performed by non-physician practitioner and as supervising physician I was immediately available for consultation/collaboration.  Vennesa Bastedo, MD 10/29/11 0754 

## 2011-10-29 NOTE — ED Provider Notes (Signed)
History     CSN: [id number]  Arrival date & time 10/28/11  2304   None     Chief Complaint  Patient presents with  . Back Pain    (Consider location/radiation/quality/duration/timing/severity/associated sxs/prior treatment) HPI History provided by pt.   Pt presents w/ c/o diffuse low back pain for the past 8 days.  Constant, non-radiating and no alleviating/aggravating factors.  No associated fever, abdominal pain, vaginal sx, LE weakness/paresthesias.  Has had increased urinary frequency, nausea and hard stools recently.  Denies trauma.   Past Medical History  Diagnosis Date  . Acid reflux   . Diabetes mellitus   . Gonorrhea     History reviewed. No pertinent past surgical history.  History reviewed. No pertinent family history.  History  Substance Use Topics  . Smoking status: Never Smoker   . Smokeless tobacco: Not on file  . Alcohol Use: Yes    OB History    Grav Para Term Preterm Abortions TAB SAB Ect Mult Living                  Review of Systems  All other systems reviewed and are negative.    Allergies  Review of patient's allergies indicates no known allergies.  Home Medications   Current Outpatient Rx  Name Route Sig Dispense Refill  . METFORMIN HCL 500 MG PO TABS Oral Take 500 mg by mouth 2 (two) times daily with a meal.      BP 129/82  Pulse 81  Temp 98 F (36.7 C) (Oral)  Resp 16  SpO2 100%  LMP 10/09/2011  Physical Exam  Nursing note and vitals reviewed. Constitutional: She is oriented to person, place, and time. She appears well-developed and well-nourished.  HENT:  Head: Normocephalic and atraumatic.  Eyes:       Normal appearance  Neck: Normal range of motion.  Cardiovascular: Normal rate and regular rhythm.   Pulmonary/Chest: Effort normal and breath sounds normal.  Abdominal: Soft. Bowel sounds are normal. She exhibits no distension. There is no tenderness.  Genitourinary:       No CVA ttp  Musculoskeletal:   Diffuse, mild lumbar tenderness, no worse at mid-line.  Mild tenderness bilateral CVA as well.  Full active ROM of LE.  Nml patellar reflexes.  No saddle anesthesia. Distal sensation intact.  2+ DP pulses.  Ambulates w/out diffulty.   Neurological: She is alert and oriented to person, place, and time.  Skin: Skin is warm and dry. No rash noted.  Psychiatric: She has a normal mood and affect. Her behavior is normal.    ED Course  Procedures (including critical care time)  Labs Reviewed  URINALYSIS, ROUTINE W REFLEX MICROSCOPIC - Abnormal; Notable for the following:    APPearance CLOUDY (*)     Hgb urine dipstick SMALL (*)     Leukocytes, UA SMALL (*)     All other components within normal limits  URINE MICROSCOPIC-ADD ON - Abnormal; Notable for the following:    Squamous Epithelial / LPF MANY (*)     Bacteria, UA FEW (*)     All other components within normal limits  POCT PREGNANCY, URINE   No results found.   1. Muscle strain       MDM  Pt presents w/ non-traumatic low back pain as well as recent nausea, hard stools and increased urinary frequency.  No significant exam findings.  No indication for emergent imaging today.  Suspect muscle strain.  Pt has received valium and  ibuprofen.  Will reassess shortly.  There is a U/A and urine preg pending as well.  1:38 AM   Pt reports that pain is much improved.  U/A negative for infection.  Results discussed w/ pt.  Pt d/c'd home w/ valium and ibuprofen and I recommended heat/ice and rest as well.  Return precautions discussed. 2:21 AM         Otilio Miu, PA 10/29/11 0221

## 2012-04-15 ENCOUNTER — Inpatient Hospital Stay (HOSPITAL_COMMUNITY)
Admission: AD | Admit: 2012-04-15 | Discharge: 2012-04-16 | Disposition: A | Discharge status: Home / Self Care | Payer: Medicaid Other | Source: Ambulatory Visit | Attending: Obstetrics & Gynecology | Admitting: Obstetrics & Gynecology

## 2012-04-15 DIAGNOSIS — A499 Bacterial infection, unspecified: Secondary | ICD-10-CM | POA: Insufficient documentation

## 2012-04-15 DIAGNOSIS — N760 Acute vaginitis: Secondary | ICD-10-CM | POA: Insufficient documentation

## 2012-04-15 DIAGNOSIS — L293 Anogenital pruritus, unspecified: Secondary | ICD-10-CM | POA: Insufficient documentation

## 2012-04-15 DIAGNOSIS — O2390 Unspecified genitourinary tract infection in pregnancy, unspecified trimester: Secondary | ICD-10-CM | POA: Insufficient documentation

## 2012-04-15 DIAGNOSIS — N949 Unspecified condition associated with female genital organs and menstrual cycle: Secondary | ICD-10-CM | POA: Insufficient documentation

## 2012-04-15 DIAGNOSIS — B373 Candidiasis of vulva and vagina: Secondary | ICD-10-CM | POA: Insufficient documentation

## 2012-04-15 DIAGNOSIS — B9689 Other specified bacterial agents as the cause of diseases classified elsewhere: Secondary | ICD-10-CM | POA: Insufficient documentation

## 2012-04-15 DIAGNOSIS — B3731 Acute candidiasis of vulva and vagina: Secondary | ICD-10-CM | POA: Insufficient documentation

## 2012-04-16 ENCOUNTER — Encounter (HOSPITAL_COMMUNITY): Payer: Self-pay

## 2012-04-16 DIAGNOSIS — A499 Bacterial infection, unspecified: Secondary | ICD-10-CM

## 2012-04-16 DIAGNOSIS — N760 Acute vaginitis: Secondary | ICD-10-CM

## 2012-04-16 DIAGNOSIS — B9689 Other specified bacterial agents as the cause of diseases classified elsewhere: Secondary | ICD-10-CM

## 2012-04-16 LAB — URINALYSIS, ROUTINE W REFLEX MICROSCOPIC
Bilirubin Urine: NEGATIVE
Ketones, ur: 15 mg/dL — AB
Nitrite: POSITIVE — AB
Protein, ur: NEGATIVE mg/dL
Urobilinogen, UA: 0.2 mg/dL (ref 0.0–1.0)

## 2012-04-16 LAB — URINE MICROSCOPIC-ADD ON

## 2012-04-16 LAB — GC/CHLAMYDIA PROBE AMP: GC Probe RNA: NEGATIVE

## 2012-04-16 LAB — GLUCOSE, CAPILLARY: Glucose-Capillary: 137 mg/dL — ABNORMAL HIGH (ref 70–99)

## 2012-04-16 LAB — POCT PREGNANCY, URINE: Preg Test, Ur: POSITIVE — AB

## 2012-04-16 MED ORDER — FLUCONAZOLE 150 MG PO TABS: 150.0000 mg | ORAL_TABLET | Freq: Once | ORAL | Status: DC

## 2012-04-16 MED ORDER — METRONIDAZOLE 500 MG PO TABS: 500.0000 mg | ORAL_TABLET | Freq: Two times a day (BID) | ORAL | Status: DC

## 2012-04-16 MED ORDER — FLUCONAZOLE 150 MG PO TABS
150.0000 mg | ORAL_TABLET | Freq: Once | ORAL | Status: AC
  Administered 2012-04-16: 150 mg via ORAL
  Filled 2012-04-16: qty 1

## 2012-04-16 NOTE — MAU Provider Note (Signed)
History     CSN: [id number]  Arrival date and time: 04/15/12 2356   First Provider Initiated Contact with Patient 04/16/12 0024      Chief Complaint  Patient presents with  . Vaginal Discharge   HPI Julia Willis is a 22 y.o. G1P0 who presents today with vulvar itching since Friday. She tried vagisil wipes, and they did not help. She is type II diabetic and has not taken the rxed metformin for a month because she was not sure if it was ok for the baby. She denies vaginal bleeding or pain. She denies any vaginal discharge. She does report an odor. Past Medical History  Diagnosis Date  . Acid reflux   . Diabetes mellitus   . Gonorrhea     No past surgical history on file.  No family history on file.  History  Substance Use Topics  . Smoking status: Never Smoker   . Smokeless tobacco: Not on file  . Alcohol Use: Yes    Allergies: No Known Allergies  Prescriptions prior to admission  Medication Sig Dispense Refill  . metFORMIN (GLUCOPHAGE) 500 MG tablet Take 500 mg by mouth 2 (two) times daily with a meal.      . diazepam (VALIUM) 5 MG tablet Take 1 tablet (5 mg total) by mouth 2 (two) times daily.  10 tablet  0  . ibuprofen (ADVIL,MOTRIN) 800 MG tablet Take 1 tablet (800 mg total) by mouth 3 (three) times daily.  12 tablet  0    Review of Systems  Constitutional: Negative for fever.  Eyes: Negative for blurred vision.  Respiratory: Negative for shortness of breath.   Cardiovascular: Negative for chest pain.  Gastrointestinal: Positive for nausea. Negative for vomiting, abdominal pain, diarrhea and constipation.  Genitourinary: Negative for dysuria, urgency and frequency.  Neurological: Negative for dizziness and headaches.   Physical Exam   Blood pressure 127/72, pulse 77, temperature 98.3 F (36.8 C), temperature source Oral, resp. rate 20, height 5\' 6"  (1.676 m), weight 220 lb 4.8 oz (99.927 kg), last menstrual period 02/16/2012, SpO2 100.00%.  Physical Exam   Nursing note and vitals reviewed. Constitutional: She is oriented to person, place, and time. She appears well-developed and well-nourished.  Cardiovascular: Normal rate.   Respiratory: Effort normal.  GI: Soft. She exhibits no distension. There is no tenderness.  Genitourinary:    External: slightly erythematous. No lesion Vagina: small amount of white malodorous discharge Cervix: pink, smooth, no CMT Uterus: enlarged Adnexa: NT  Neurological: She is alert and oriented to person, place, and time.  Skin: Skin is warm and dry.    MAU Course  Procedures  MDM * Results for orders placed during the hospital encounter of 04/15/12 (from the past 24 hour(s))  URINALYSIS, ROUTINE W REFLEX MICROSCOPIC     Status: Abnormal   Collection Time    04/16/12 12:02 AM      Result Value Range   Color, Urine YELLOW  YELLOW   APPearance CLEAR  CLEAR   Specific Gravity, Urine >1.030 (*) 1.005 - 1.030   pH 6.0  5.0 - 8.0   Glucose, UA >1000 (*) NEGATIVE mg/dL   Hgb urine dipstick MODERATE (*) NEGATIVE   Bilirubin Urine NEGATIVE  NEGATIVE   Ketones, ur 15 (*) NEGATIVE mg/dL   Protein, ur NEGATIVE  NEGATIVE mg/dL   Urobilinogen, UA 0.2  0.0 - 1.0 mg/dL   Nitrite POSITIVE (*) NEGATIVE   Leukocytes, UA TRACE (*) NEGATIVE  URINE MICROSCOPIC-ADD ON  Status: Abnormal   Collection Time    04/16/12 12:02 AM      Result Value Range   Squamous Epithelial / LPF FEW (*) RARE   WBC, UA 7-10  <3 WBC/hpf   RBC / HPF 3-6  <3 RBC/hpf   Bacteria, UA MANY (*) RARE   Urine-Other MUCOUS PRESENT    POCT PREGNANCY, URINE     Status: Abnormal   Collection Time    04/16/12 12:26 AM      Result Value Range   Preg Test, Ur POSITIVE (*) NEGATIVE  WET PREP, GENITAL     Status: Abnormal   Collection Time    04/16/12 12:30 AM      Result Value Range   Yeast Wet Prep HPF POC FEW (*) NONE SEEN   Trich, Wet Prep NONE SEEN  NONE SEEN   Clue Cells Wet Prep HPF POC MODERATE (*) NONE SEEN   WBC, Wet Prep HPF POC  FEW (*) NONE SEEN  GLUCOSE, CAPILLARY     Status: Abnormal   Collection Time    04/16/12 12:50 AM      Result Value Range   Glucose-Capillary 137 (*) 70 - 99 mg/dL     Assessment and Plan   1. BV (bacterial vaginosis)   2. Vulvovaginal candidiasis    RX flagyl and diflucan Resume metformin Start [facility] as soon as possible 1st trimester danger signs reviewed Return to MAU as needed  Tawnya Crook 04/16/2012, 12:25 AM

## 2012-04-16 NOTE — MAU Note (Signed)
Pt states irritation began last week. Denies vaginal bleeding.

## 2012-04-17 LAB — URINE CULTURE

## 2012-04-18 ENCOUNTER — Other Ambulatory Visit: Payer: Self-pay | Admitting: Advanced Practice Midwife

## 2012-04-18 MED ORDER — CEPHALEXIN 500 MG PO CAPS: 500.0000 mg | ORAL_CAPSULE | Freq: Two times a day (BID) | ORAL | Status: DC

## 2012-04-18 MED ORDER — CEPHALEXIN 500 MG PO CAPS: 500.0000 mg | ORAL_CAPSULE | Freq: Four times a day (QID) | ORAL | Status: DC

## 2012-04-18 NOTE — Progress Notes (Signed)
Urine culture positive for UTI, Keflex sent to pt pharmacy.  Pt notified.

## 2012-04-19 NOTE — MAU Provider Note (Signed)
Attestation of Attending Supervision of Advanced Practitioner (CNM/NP): Evaluation and management procedures were performed by the Advanced Practitioner under my supervision and collaboration.  I have reviewed the Advanced Practitioner's note and chart, and I agree with the management and plan.  HARRAWAY-SMITH, Leyah Bocchino 1:33 PM     

## 2012-05-06 ENCOUNTER — Encounter (HOSPITAL_COMMUNITY): Payer: Self-pay | Admitting: *Deleted

## 2012-05-06 ENCOUNTER — Inpatient Hospital Stay (HOSPITAL_COMMUNITY)
Admission: AD | Admit: 2012-05-06 | Discharge: 2012-05-06 | Disposition: A | Discharge status: Hospice | Payer: Medicaid Other | Source: Ambulatory Visit | Attending: Obstetrics and Gynecology | Admitting: Obstetrics and Gynecology

## 2012-05-06 DIAGNOSIS — B9689 Other specified bacterial agents as the cause of diseases classified elsewhere: Secondary | ICD-10-CM | POA: Insufficient documentation

## 2012-05-06 DIAGNOSIS — N949 Unspecified condition associated with female genital organs and menstrual cycle: Secondary | ICD-10-CM | POA: Insufficient documentation

## 2012-05-06 DIAGNOSIS — A499 Bacterial infection, unspecified: Secondary | ICD-10-CM | POA: Diagnosis not present

## 2012-05-06 DIAGNOSIS — N760 Acute vaginitis: Secondary | ICD-10-CM | POA: Diagnosis not present

## 2012-05-06 DIAGNOSIS — O24919 Unspecified diabetes mellitus in pregnancy, unspecified trimester: Secondary | ICD-10-CM | POA: Diagnosis not present

## 2012-05-06 DIAGNOSIS — O2390 Unspecified genitourinary tract infection in pregnancy, unspecified trimester: Secondary | ICD-10-CM | POA: Insufficient documentation

## 2012-05-06 DIAGNOSIS — B3731 Acute candidiasis of vulva and vagina: Secondary | ICD-10-CM

## 2012-05-06 DIAGNOSIS — E119 Type 2 diabetes mellitus without complications: Secondary | ICD-10-CM | POA: Insufficient documentation

## 2012-05-06 DIAGNOSIS — B373 Candidiasis of vulva and vagina: Secondary | ICD-10-CM

## 2012-05-06 LAB — URINE MICROSCOPIC-ADD ON

## 2012-05-06 LAB — URINALYSIS, ROUTINE W REFLEX MICROSCOPIC
Glucose, UA: >1000 mg/dL — AB
Ketones, ur: 15 mg/dL — AB
Protein, ur: NEGATIVE mg/dL

## 2012-05-06 LAB — WET PREP, GENITAL

## 2012-05-06 MED ORDER — METRONIDAZOLE 500 MG PO TABS: 500.0000 mg | ORAL_TABLET | Freq: Two times a day (BID) | ORAL | Status: DC

## 2012-05-06 MED ORDER — ONDANSETRON HCL 4 MG PO TABS: 4.0000 mg | ORAL_TABLET | Freq: Four times a day (QID) | ORAL | Status: DC

## 2012-05-06 MED ORDER — FLUCONAZOLE 150 MG PO TABS: 150.0000 mg | ORAL_TABLET | Freq: Once | ORAL | Status: DC

## 2012-05-06 MED ORDER — PROMETHAZINE HCL 12.5 MG PO TABS: 12.5000 mg | ORAL_TABLET | Freq: Four times a day (QID) | ORAL | Status: DC | PRN

## 2012-05-06 MED ORDER — FLUCONAZOLE 150 MG PO TABS
150.0000 mg | ORAL_TABLET | Freq: Once | ORAL | Status: AC
  Administered 2012-05-06: 150 mg via ORAL
  Filled 2012-05-06: qty 1

## 2012-05-06 NOTE — MAU Note (Signed)
Pt states she wants her vagnal discharge checked out

## 2012-05-06 NOTE — MAU Note (Signed)
C/O vag itching and yellow thick discharge that started over the weekend.  Denies pain

## 2012-05-06 NOTE — MAU Provider Note (Signed)
History     CSN: [id number]  Arrival date and time: 05/06/12 2031   First Provider Initiated Contact with Patient 05/06/12 2213      Chief Complaint  Patient presents with  . Vaginal Discharge   HPI [name] is a 22 y.o. G1P0 at [redacted]w[redacted]d who presents to MAU today with complaint of vaginal discharge. The patient states that discharge started this weekend. It is yellow and thick. She denies bleeding or pain. She has no other complaints today. She has not yet started prenatal care. She is waiting for Medicaid approval.   OB History   Grav Para Term Preterm Abortions TAB SAB Ect Mult Living   1               Past Medical History  Diagnosis Date  . Acid reflux   . Diabetes mellitus   . Gonorrhea     History reviewed. No pertinent past surgical history.  Family History  Problem Relation Age of Onset  . Diabetes Mother   . Hypertension Father   . Heart disease Father   . Diabetes Maternal Aunt   . Hypertension Maternal Aunt   . Diabetes Maternal Uncle   . Hypertension Maternal Uncle   . Cancer Maternal Uncle   . Hypertension Paternal Aunt   . Hypertension Paternal Uncle     History  Substance Use Topics  . Smoking status: Never Smoker   . Smokeless tobacco: Not on file  . Alcohol Use: No    Allergies: No Known Allergies  Prescriptions prior to admission  Medication Sig Dispense Refill  . acetaminophen (TYLENOL) 325 MG tablet Take 650 mg by mouth every 6 (six) hours as needed for pain.      . metFORMIN (GLUCOPHAGE) 500 MG tablet Take 500 mg by mouth 2 (two) times daily with a meal.      . Prenatal Vit-Fe Fumarate-FA (PRENATAL MULTIVITAMIN) TABS Take 1 tablet by mouth daily at 12 noon.        Review of Systems  Constitutional: Negative for fever.  Genitourinary: Negative for dysuria, urgency and frequency.       + vaginal discharge Neg - vaginal bleeding   Physical Exam   Blood pressure 129/73, pulse 89, resp. rate 20, height 5\' 6"  (1.676 m), weight  222 lb (100.699 kg), last menstrual period 02/16/2012, SpO2 99.00%.  Physical Exam  Constitutional: She is oriented to person, place, and time. She appears well-developed and well-nourished. No distress.  HENT:  Head: Normocephalic and atraumatic.  Cardiovascular: Normal rate.   Respiratory: Effort normal.  GI: Soft. She exhibits no distension and no mass. There is no tenderness. There is no rebound and no guarding.  Genitourinary: Cervix exhibits friability. Cervix exhibits no motion tenderness and no discharge. Right adnexum displays no mass and no tenderness. Left adnexum displays no mass and no tenderness. Vaginal discharge (moderate amount of thick, white discharge noted) found.  Neurological: She is alert and oriented to person, place, and time.  Skin: Skin is warm and dry. No erythema.  Psychiatric: She has a normal mood and affect.   Results for orders placed during the hospital encounter of 05/06/12 (from the past 24 hour(s))  URINALYSIS, ROUTINE W REFLEX MICROSCOPIC     Status: Abnormal   Collection Time    05/06/12  8:50 PM      Result Value Range   Color, Urine YELLOW  YELLOW   APPearance CLEAR  CLEAR   Specific Gravity, Urine 1.025  1.005 -  1.030   pH 6.0  5.0 - 8.0   Glucose, UA >1000 (*) NEGATIVE mg/dL   Hgb urine dipstick MODERATE (*) NEGATIVE   Bilirubin Urine NEGATIVE  NEGATIVE   Ketones, ur 15 (*) NEGATIVE mg/dL   Protein, ur NEGATIVE  NEGATIVE mg/dL   Urobilinogen, UA 1.0  0.0 - 1.0 mg/dL   Nitrite NEGATIVE  NEGATIVE   Leukocytes, UA SMALL (*) NEGATIVE  URINE MICROSCOPIC-ADD ON     Status: None   Collection Time    05/06/12  8:50 PM      Result Value Range   Squamous Epithelial / LPF RARE  RARE   WBC, UA 0-2  <3 WBC/hpf   RBC / HPF 3-6  <3 RBC/hpf   Urine-Other YEAST    WET PREP, GENITAL     Status: Abnormal   Collection Time    05/06/12 10:15 PM      Result Value Range   Yeast Wet Prep HPF POC MANY (*) NONE SEEN   Trich, Wet Prep NONE SEEN  NONE SEEN    Clue Cells Wet Prep HPF POC MODERATE (*) NONE SEEN   WBC, Wet Prep HPF POC MODERATE (*) NONE SEEN    MAU Course  Procedures None  MDM Wet prep and GC/Chlamydia today 150 mg Diflucan here Assessment and Plan  A: Bacterial vaginosis Yeast vaginitis Diabetes mellitus  P: Discharge home Rx for diflucan to be taken again in 3 days and flagyl x 7 days sent to patient's pharmacy Start prenatal care as soon as possible. Discussed [facility] clinic for high risk pregnancy. Referral to [facility] sent to clinic. Patient agrees this is the best option considering her chronic condition Patient may return to MAU as needed or if her condition were to change or worsen  Freddi Starr, PA-C  05/06/2012, 10:42 PM

## 2012-05-07 NOTE — MAU Provider Note (Signed)
Attestation of Attending Supervision of Advanced Practitioner (CNM/NP): Evaluation and management procedures were performed by the Advanced Practitioner under my supervision and collaboration.  I have reviewed the Advanced Practitioner's note and chart, and I agree with the management and plan.  Laramie Meissner 05/07/2012 4:31 AM

## 2012-05-13 ENCOUNTER — Ambulatory Visit (INDEPENDENT_AMBULATORY_CARE_PROVIDER_SITE_OTHER): Payer: Medicaid Other | Admitting: Obstetrics & Gynecology

## 2012-05-13 ENCOUNTER — Encounter: Payer: Self-pay | Admitting: Obstetrics & Gynecology

## 2012-05-13 ENCOUNTER — Encounter: Payer: Medicaid Other | Attending: Obstetrics & Gynecology | Admitting: Dietician

## 2012-05-13 VITALS — BP 133/94 | Temp 97.0°F | Wt 220.0 lb

## 2012-05-13 DIAGNOSIS — O24919 Unspecified diabetes mellitus in pregnancy, unspecified trimester: Secondary | ICD-10-CM

## 2012-05-13 DIAGNOSIS — O0990 Supervision of high risk pregnancy, unspecified, unspecified trimester: Secondary | ICD-10-CM | POA: Insufficient documentation

## 2012-05-13 DIAGNOSIS — Z713 Dietary counseling and surveillance: Secondary | ICD-10-CM | POA: Insufficient documentation

## 2012-05-13 DIAGNOSIS — O99810 Abnormal glucose complicating pregnancy: Secondary | ICD-10-CM | POA: Insufficient documentation

## 2012-05-13 DIAGNOSIS — O24911 Unspecified diabetes mellitus in pregnancy, first trimester: Secondary | ICD-10-CM

## 2012-05-13 DIAGNOSIS — O0991 Supervision of high risk pregnancy, unspecified, first trimester: Secondary | ICD-10-CM

## 2012-05-13 LAB — POCT URINALYSIS DIP (DEVICE)
Glucose, UA: 500 mg/dL — AB
Nitrite: NEGATIVE
Urobilinogen, UA: 0.2 mg/dL (ref 0.0–1.0)
pH: 6.5 (ref 5.0–8.0)

## 2012-05-13 NOTE — Progress Notes (Signed)
Unsure LMP, no BCM, Type 2 DM, need to start BG testing.   Subjective:  first obstetrical visit    Julia Willis is a G1P0 [redacted]w[redacted]d being seen today for her first obstetrical visit.  Her obstetrical history is significant for obesity and type 2 diabetes. Patient does intend to breast feed. Pregnancy history fully reviewed.  Patient reports nausea.  Filed Vitals:   05/13/12 0840  BP: 133/94  Temp: 97 F (36.1 C)  Weight: 220 lb (99.791 kg)    HISTORY: OB History   Grav Para Term Preterm Abortions TAB SAB Ect Mult Living   1              # Outc Date GA Lbr Len/2nd Wgt Sex Del Anes PTL Lv   1 CUR              Past Medical History  Diagnosis Date  . Acid reflux   . Diabetes mellitus   . Gonorrhea    History reviewed. No pertinent past surgical history. Family History  Problem Relation Age of Onset  . Diabetes Mother   . Hypertension Father   . Heart disease Father   . Diabetes Maternal Aunt   . Hypertension Maternal Aunt   . Diabetes Maternal Uncle   . Hypertension Maternal Uncle   . Cancer Maternal Uncle   . Hypertension Paternal Aunt   . Hypertension Paternal Uncle      Exam    Uterus:  Fundal Height: 14 cm  Pelvic Exam:    Perineum: No Hemorrhoids, Normal Perineum   Vulva: normal   Vagina:  normal mucosa   pH:     Cervix: no lesions   Adnexa: normal adnexa   Bony Pelvis: average  System: Breast:  normal appearance, no masses or tenderness   Skin: normal coloration and turgor, no rashes    Neurologic: oriented, normal mood   Extremities: normal strength, tone, and muscle mass, obese   HEENT sclera clear, anicteric, neck supple with midline trachea and thyroid without masses   Mouth/Teeth mucous membranes moist, pharynx normal without lesions   Neck supple and no masses   Cardiovascular: regular rate and rhythm, no murmurs or gallops   Respiratory:  appears well, vitals normal, no respiratory distress, acyanotic, normal RR, neck free of mass or  lymphadenopathy, chest clear, no wheezing, crepitations, rhonchi, normal symmetric air entry   Abdomen: soft, non-tender; bowel sounds normal; no masses,  no organomegaly and obese   Urinary: urethral meatus normal      Assessment:    Pregnancy: G1P0 Patient Active Problem List   Diagnosis Date Noted  . High-risk pregnancy supervision 05/13/2012  . Diabetes mellitus in pregnancy, antepartum 05/13/2012        Plan:     Initial labs drawn. Prenatal vitamins. Problem list reviewed and updated. Genetic Screening discussed First Screen: ordered.  Ultrasound discussed; fetal survey: ordered.  Follow up in 2 weeks. 50% of 30 min visit spent on counseling and coordination of care.      ARNOLD,JAMES 05/14/2012

## 2012-05-13 NOTE — Progress Notes (Signed)
Nutrition note: 1st visit consult/ DM education Pt has Type 2 DM & h/o obesity. Pt has gained 0# @ [redacted]w[redacted]d, which is slightly < expected.  Pt reports having 3 meals & 2 snacks/d. Pt is taking PNV. Pt reports nausea is improving & no heartburn. NKFA. Pt reports walking 2-3x/wk for 30 mins.  Pt has had DM for 1 yr and tries to monitor how many CHO at each meal/ snack and reads food labels. Pt received verbal & written education on nutrition with DM during pregnancy. Reviewed diet & food labels. Disc wt gain goals of 11-20# or 0.5#/wk. Pt agrees to follow DM diet with 3 meals & 3 snacks/d and proper CHO/ protein combination. Pt has WIC & plans to BF. F/u in 2-4 wks Blondell Reveal, MS, RD, LDN

## 2012-05-13 NOTE — Patient Instructions (Signed)
Gestational Diabetes Mellitus Gestational diabetes mellitus (GDM) is diabetes that occurs only during pregnancy. This happens when the body cannot properly handle the glucose (sugar) that increases in the blood after eating. During pregnancy, insulin resistance (reduced sensitivity to insulin) occurs because of the release of hormones from the placenta. Usually, the pancreas of pregnant women produces enough insulin to overcome the resistance that occurs. However, in gestational diabetes, the insulin is there but it does not work effectively. If the resistance is severe enough that the pancreas does not produce enough insulin, extra glucose builds up in the blood.  WHO IS AT RISK FOR DEVELOPING GESTATIONAL DIABETES?  Women with a history of diabetes in the family.  Women over age 25.  Women who are overweight.  Women in certain ethnic groups (Hispanic, African American, Native American, Asian and Pacific Islander). WHAT CAN HAPPEN TO THE BABY? If the mother's blood glucose is too high while she is pregnant, the extra sugar will travel through the umbilical cord to the baby. Some of the problems the baby may have are:  Large Baby - If the baby receives too much sugar, the baby will gain more weight. This may cause the baby to be too large to be born normally (vaginally) and a Cesarean section (C-section) may be needed.  Low Blood Glucose (hypoglycemia)  The baby makes extra insulin, in response to the extra sugar its gets from its mother. When the baby is born and no longer needs this extra insulin, the baby's blood glucose level may drop.  Jaundice (yellow coloring of the skin and eyes)  This is fairly common in babies. It is caused from a build-up of the chemical called bilirubin. This is rarely serious, but is seen more often in babies whose mothers had gestational diabetes. RISKS TO THE MOTHER Women who have had gestational diabetes may be at higher risk for some problems,  including:  Preeclampsia or toxemia, which includes problems with high blood pressure. Blood pressure and protein levels in the urine must be checked frequently.  Infections.  Cesarean section (C-section) for delivery.  Developing Type 2 diabetes later in life. About 30-50% will develop diabetes later, especially if obese. DIAGNOSIS  The hormones that cause insulin resistance are highest at about 24-28 weeks of pregnancy. If symptoms are experienced, they are much like symptoms you would normally expect during pregnancy.  GDM is often diagnosed using a two part method: 1. After 24-28 weeks of pregnancy, the woman drinks a glucose solution and takes a blood test. If the glucose level is high, a second test will be given. 2. Oral Glucose Tolerance Test (OGTT) which is 3 hours long  After not eating overnight, the blood glucose is checked. The woman drinks a glucose solution, and hourly blood glucose tests are taken. If the woman has risk factors for GDM, the caregiver may test earlier than 24 weeks of pregnancy. TREATMENT  Treatment of GDM is directed at keeping the mother's blood glucose level normal, and may include:  Meal planning.  Taking insulin or other medicine to control your blood glucose level.  Exercise.  Keeping a daily record of the foods you eat.  Blood glucose monitoring and keeping a record of your blood glucose levels.  May monitor ketone levels in the urine, although this is no longer considered necessary in most pregnancies. HOME CARE INSTRUCTIONS  While you are pregnant:  Follow your caregiver's advice regarding your prenatal appointments, meal planning, exercise, medicines, vitamins, blood and other tests, and physical   activities.  Keep a record of your meals, blood glucose tests, and the amount of insulin you are taking (if any). Show this to your caregiver at every prenatal visit.  If you have GDM, you may have problems with hypoglycemia (low blood glucose).  You may suspect this if you become suddenly dizzy, feel shaky, and/or weak. If you think this is happening and you have a glucose meter, try to test your blood glucose level. Follow your caregiver's advice for when and how to treat your low blood glucose. Generally, the 15:15 rule is followed: Treat by consuming 15 grams of carbohydrates, wait 15 minutes, and recheck blood glucose. Examples of 15 grams of carbohydrates are:  1 cup skim or low-fat milk.   cup juice.  3-4 glucose tablets.  5-6 hard candies.  1 small box raisins.   cup regular soda pop.  Practice good hygiene, to avoid infections.  Do not smoke. SEEK MEDICAL CARE IF:   You develop abnormal vaginal discharge, with or without itching.  You become weak and tired more than expected.  You seem to sweat a lot.  You have a sudden increase in weight, 5 pounds or more in one week.  You are losing weight, 3 pounds or more in a week.  Your blood glucose level is high, and you need instructions on what to do about it. SEEK IMMEDIATE MEDICAL CARE IF:   You develop a severe headache.  You faint or pass out.  You develop nausea and vomiting.  You become disoriented or confused.  You have a convulsion.  You develop vision problems.  You develop stomach pain.  You develop vaginal bleeding.  You develop uterine contractions.  You have leaking or a gush of fluid from the vagina. AFTER YOU HAVE THE BABY:  Go to all of your follow-up appointments, and have blood tests as advised by your caregiver.  Maintain a healthy lifestyle, to prevent diabetes in the future. This includes:  Following a healthy meal plan.  Controlling your weight.  Getting enough exercise and proper rest.  Do not smoke.  Breastfeed your baby if you can. This will lower the chance of you and your baby developing diabetes later in life. For more information about diabetes, go to the American Diabetes Association at:  www.americandiabetesassociation.org. For more information about gestational diabetes, go to the American Congress of Obstetricians and Gynecologists at: www.acog.org. Document Released: 04/03/2000 Document Revised: 03/20/2011 Document Reviewed: 10/26/2008 Exit[employer] Patient Information 2013 [employer].  

## 2012-05-13 NOTE — Progress Notes (Signed)
Diabetes Education:  Comes today with her mother.  G1P0 lady. Currently at 12 weeks.  Gives pre-pregnancy weight of 220 lbs. Mother has ha history of type 2 diabetes.  Completed review of the basic physiology of GDM; effects of exercise on blood glucose, and recommendations for daily walking at 30 minutes; review of hypoglycemia, S/S and treatment; blood glucose monitoring and blood glucose goals.  Has applied for [place] Medicaid and has not received approval or a card.  Provided a True Track meter and 1 box of strips and lancets.  On return demonstration, blood glucose at 10:00 AM after breakfast 3 hours ago was at 101 mg/dl.  Instructed to monitor fasting and 2 hr PP glucose levels, record levels and to bring glucose log and meter to all clinic appointments.  Maggie Alayna Mabe, RN, RD, LDN, CDE.

## 2012-05-14 LAB — OBSTETRIC PANEL
Basophils Relative: 0 % (ref 0–1)
Eosinophils Absolute: 0 10*3/uL (ref 0.0–0.7)
Hepatitis B Surface Ag: NEGATIVE
MCH: 26.9 pg (ref 26.0–34.0)
MCHC: 33.6 g/dL (ref 30.0–36.0)
Neutrophils Relative %: 66 % (ref 43–77)
Platelets: 339 10*3/uL (ref 150–400)
RDW: 15.1 % (ref 11.5–15.5)

## 2012-05-14 LAB — CULTURE, OB URINE: Colony Count: 4000

## 2012-05-15 LAB — HEMOGLOBINOPATHY EVALUATION: Hgb F Quant: 0 % (ref 0.0–2.0)

## 2012-05-16 ENCOUNTER — Inpatient Hospital Stay (HOSPITAL_COMMUNITY)
Admission: AD | Admit: 2012-05-16 | Discharge: 2012-05-16 | Disposition: A | Discharge status: Home / Self Care | Payer: Medicaid Other | Source: Ambulatory Visit | Attending: Obstetrics & Gynecology | Admitting: Obstetrics & Gynecology

## 2012-05-16 ENCOUNTER — Encounter (HOSPITAL_COMMUNITY): Payer: Self-pay | Admitting: Family

## 2012-05-16 ENCOUNTER — Inpatient Hospital Stay (HOSPITAL_COMMUNITY): Payer: Medicaid Other

## 2012-05-16 DIAGNOSIS — O30009 Twin pregnancy, unspecified number of placenta and unspecified number of amniotic sacs, unspecified trimester: Secondary | ICD-10-CM | POA: Insufficient documentation

## 2012-05-16 DIAGNOSIS — O30049 Twin pregnancy, dichorionic/diamniotic, unspecified trimester: Secondary | ICD-10-CM

## 2012-05-16 DIAGNOSIS — O209 Hemorrhage in early pregnancy, unspecified: Secondary | ICD-10-CM | POA: Insufficient documentation

## 2012-05-16 LAB — CBC
Hemoglobin: 11.8 g/dL — ABNORMAL LOW (ref 12.0–15.0)
MCHC: 33.6 g/dL (ref 30.0–36.0)
WBC: 9.2 10*3/uL (ref 4.0–10.5)

## 2012-05-16 MED ORDER — HYDROMORPHONE HCL PF 1 MG/ML IJ SOLN
1.0000 mg | Freq: Once | INTRAMUSCULAR | Status: AC
  Administered 2012-05-16: 1 mg via INTRAMUSCULAR
  Filled 2012-05-16: qty 1

## 2012-05-16 NOTE — MAU Provider Note (Signed)
Attestation of Attending Supervision of Advanced Practitioner (CNM/NP): Evaluation and management procedures were performed by the Advanced Practitioner under my supervision and collaboration.  I have reviewed the Advanced Practitioner's note and chart, and I agree with the management and plan.  HARRAWAY-SMITH, Briggett Tuccillo 2:58 PM     

## 2012-05-16 NOTE — MAU Note (Signed)
Patient arrives to MAU by EMS c/o vaginal bleeding for one hour. Reports rectal pressure since in ambulance. First prenatal visit at [facility] 3/5. Reports Type II Diabetic, Metaformin 500 mg once daily.  Denies fever, chills, N/V.

## 2012-05-16 NOTE — MAU Provider Note (Signed)
History     CSN: [id number]  Arrival date and time: 05/16/12 1248   First Provider Initiated Contact with Patient 05/16/12 1307      Chief Complaint  Patient presents with  . Threatened Miscarriage   HPI [name] is a 22 y.o. G1P0 at [redacted]w[redacted]d who presents to MAU today by EMS with complaint of vaginal bleeding. The patient states that she had heavy bleeding start about 1 hour ago. She has passed some large clots. She is having increased pressure in the lower abdomen and back. She denies severe pain, N/V or fever. The patient had her first prenatal visit on Monday.   OB History   Grav Para Term Preterm Abortions TAB SAB Ect Mult Living   1               Past Medical History  Diagnosis Date  . Acid reflux   . Diabetes mellitus   . Gonorrhea     History reviewed. No pertinent past surgical history.  Family History  Problem Relation Age of Onset  . Diabetes Mother   . Hypertension Father   . Heart disease Father   . Diabetes Maternal Aunt   . Hypertension Maternal Aunt   . Diabetes Maternal Uncle   . Hypertension Maternal Uncle   . Cancer Maternal Uncle   . Hypertension Paternal Aunt   . Hypertension Paternal Uncle     History  Substance Use Topics  . Smoking status: Never Smoker   . Smokeless tobacco: Not on file  . Alcohol Use: No    Allergies: No Known Allergies  Prescriptions prior to admission  Medication Sig Dispense Refill  . metFORMIN (GLUCOPHAGE) 500 MG tablet Take 500 mg by mouth 2 (two) times daily with a meal.      . metroNIDAZOLE (FLAGYL) 500 MG tablet Take 500 mg by mouth 2 (two) times daily. Pt started on 05/06/12 for 7 days.      . Prenatal Vit-Fe Fumarate-FA (PRENATAL MULTIVITAMIN) TABS Take 1 tablet by mouth daily at 12 noon.        Review of Systems  Constitutional: Negative for fever and malaise/fatigue.  Gastrointestinal: Positive for abdominal pain. Negative for nausea and vomiting.  Genitourinary:       + vaginal bleeding    Neurological: Negative for loss of consciousness and weakness.   Physical Exam   Blood pressure 127/86, pulse 97, temperature 97.6 F (36.4 C), temperature source Oral, resp. rate 18, height 5\' 6"  (1.676 m), weight 220 lb (99.791 kg), last menstrual period 02/14/2012.  Physical Exam  Constitutional: She is oriented to person, place, and time. She appears well-developed and well-nourished. No distress.  HENT:  Head: Normocephalic and atraumatic.  Cardiovascular: Normal rate, regular rhythm and normal heart sounds.   Respiratory: Effort normal and breath sounds normal. No respiratory distress.  GI: Soft. Bowel sounds are normal. She exhibits no distension and no mass. There is no tenderness. There is no rebound.  Genitourinary:  Speculum exam performed by Truitt Merle, MD. She reports one large clot and moderate bleeding  Neurological: She is alert and oriented to person, place, and time.  Skin: Skin is warm and dry. No erythema.   Results for orders placed during the hospital encounter of 05/16/12 (from the past 24 hour(s))  CBC     Status: Abnormal   Collection Time    05/16/12  1:15 PM      Result Value Range   WBC 9.2  4.0 - 10.5 K/uL  RBC 4.30  3.87 - 5.11 MIL/uL   Hemoglobin 11.8 (*) 12.0 - 15.0 g/dL   HCT 81.1 (*) 91.4 - 78.2 %   MCV 81.6  78.0 - 100.0 fL   MCH 27.4  26.0 - 34.0 pg   MCHC 33.6  30.0 - 36.0 g/dL   RDW 95.6  21.3 - 08.6 %   Platelets 327  150 - 400 K/uL     MAU Course  Procedures None  MDM CBC and [place] today Hemodynamically stable Di/Di twins with normal FHR on [place].  Assessment and Plan  A: Di/Di Twins at 12w 0d Small subchorionic hemorrhage  P: Discharge home Bleeding precautions discussed Patient to keep scheduled follow-up in [facility] clinic for routine prenatal care Patient may return to MAU as needed or if her condition were to change or worsen  Freddi Starr, PA-C  05/16/2012, 2:43 PM

## 2012-05-23 ENCOUNTER — Inpatient Hospital Stay (HOSPITAL_COMMUNITY)
Admission: AD | Admit: 2012-05-23 | Discharge: 2012-05-24 | Disposition: A | Discharge status: Home / Self Care | Payer: Medicaid Other | Source: Ambulatory Visit | Attending: Obstetrics and Gynecology | Admitting: Obstetrics and Gynecology

## 2012-05-23 DIAGNOSIS — N3090 Cystitis, unspecified without hematuria: Secondary | ICD-10-CM | POA: Insufficient documentation

## 2012-05-23 DIAGNOSIS — O30009 Twin pregnancy, unspecified number of placenta and unspecified number of amniotic sacs, unspecified trimester: Secondary | ICD-10-CM | POA: Insufficient documentation

## 2012-05-23 DIAGNOSIS — O209 Hemorrhage in early pregnancy, unspecified: Secondary | ICD-10-CM | POA: Insufficient documentation

## 2012-05-23 DIAGNOSIS — O2342 Unspecified infection of urinary tract in pregnancy, second trimester: Secondary | ICD-10-CM

## 2012-05-23 DIAGNOSIS — O2390 Unspecified genitourinary tract infection in pregnancy, unspecified trimester: Secondary | ICD-10-CM | POA: Insufficient documentation

## 2012-05-23 DIAGNOSIS — R109 Unspecified abdominal pain: Secondary | ICD-10-CM | POA: Insufficient documentation

## 2012-05-23 DIAGNOSIS — O30049 Twin pregnancy, dichorionic/diamniotic, unspecified trimester: Secondary | ICD-10-CM | POA: Insufficient documentation

## 2012-05-23 DIAGNOSIS — O4692 Antepartum hemorrhage, unspecified, second trimester: Secondary | ICD-10-CM

## 2012-05-24 ENCOUNTER — Encounter (HOSPITAL_COMMUNITY): Payer: Self-pay

## 2012-05-24 ENCOUNTER — Inpatient Hospital Stay (HOSPITAL_COMMUNITY): Payer: Medicaid Other

## 2012-05-24 DIAGNOSIS — O4690 Antepartum hemorrhage, unspecified, unspecified trimester: Secondary | ICD-10-CM

## 2012-05-24 LAB — URINE MICROSCOPIC-ADD ON

## 2012-05-24 LAB — URINALYSIS, ROUTINE W REFLEX MICROSCOPIC
Ketones, ur: NEGATIVE mg/dL
Leukocytes, UA: NEGATIVE
Nitrite: NEGATIVE
Specific Gravity, Urine: 1.02 (ref 1.005–1.030)
pH: 6.5 (ref 5.0–8.0)

## 2012-05-24 MED ORDER — CYCLOBENZAPRINE HCL 10 MG PO TABS
10.0000 mg | ORAL_TABLET | Freq: Once | ORAL | Status: AC
  Administered 2012-05-24: 10 mg via ORAL
  Filled 2012-05-24: qty 1

## 2012-05-24 MED ORDER — NITROFURANTOIN MONOHYD MACRO 100 MG PO CAPS: 100.0000 mg | ORAL_CAPSULE | Freq: Two times a day (BID) | ORAL | Status: DC

## 2012-05-24 MED ORDER — PHENAZOPYRIDINE HCL 200 MG PO TABS: 200.0000 mg | ORAL_TABLET | Freq: Three times a day (TID) | ORAL | Status: DC | PRN

## 2012-05-24 NOTE — MAU Note (Signed)
Lower abdominal pain that is intermittent and wraps around her back , started 3 am this morning. Denies vaginal bleeding or vaginal discharge. Some abdominal pain after urinating and increased urination.

## 2012-05-24 NOTE — MAU Provider Note (Signed)
History     CSN: [id number]  Arrival date and time: 05/23/12 2342   First Provider Initiated Contact with Patient 05/24/12 0044      Chief Complaint  Patient presents with  . Abdominal Pain   HPI Julia Willis is a 22 y.o. female G1P0 who presents at [redacted]w[redacted]d with Di-Di complaining of pain that started nearly 24 hours ago.  Her PMH is significant for T2DM.  She was seen here 1 week ago for vaginal bleeding and passing clots.  She states that her bleeding had been improving, but states that she just went to the restroom upon arrival and passed a bigger clot and noticed blood when she wiped.  She states that she has intermittent 8/10 crampy pain that lasts seconds at a time.  She has taken nothing for her pains.  She reports urinary frequency, but has not noticed an increase in urgency or intensity in her complaints.  She sees the [facility] for her [facility]BGYN care; her next appointment is Monday at 9:45.  She has felt no contractions, denies vaginal discharge, and is unsure if she has felt her babies move.    OB History   Grav Para Term Preterm Abortions TAB SAB Ect Mult Living   1               Past Medical History  Diagnosis Date  . Acid reflux   . Gonorrhea June 2013  . Diabetes mellitus     metformin    Past Surgical History  Procedure Laterality Date  . No past surgeries      Family History  Problem Relation Age of Onset  . Diabetes Mother   . Hypertension Father   . Heart disease Father   . Diabetes Maternal Aunt   . Hypertension Maternal Aunt   . Diabetes Maternal Uncle   . Hypertension Maternal Uncle   . Cancer Maternal Uncle   . Hypertension Paternal Aunt   . Hypertension Paternal Uncle     History  Substance Use Topics  . Smoking status: Never Smoker   . Smokeless tobacco: Not on file  . Alcohol Use: No    Allergies: No Known Allergies  Prescriptions prior to admission  Medication Sig Dispense Refill  . docusate sodium (COLACE) 100 MG capsule Take 100 mg by mouth  2 (two) times daily.      . metFORMIN (GLUCOPHAGE) 500 MG tablet Take 500 mg by mouth 2 (two) times daily with a meal.      . Prenatal Vit-Fe Fumarate-FA (PRENATAL MULTIVITAMIN) TABS Take 1 tablet by mouth daily at 12 noon.      . metroNIDAZOLE (FLAGYL) 500 MG tablet Take 500 mg by mouth 2 (two) times daily. Pt started on 05/06/12 for 7 days.        Review of Systems  Constitutional: Negative for fever and chills.  Respiratory: Negative for cough, sputum production and shortness of breath.   Cardiovascular: Negative for chest pain.  Gastrointestinal: Positive for abdominal pain and constipation. Negative for nausea, vomiting and diarrhea.  Genitourinary: Positive for frequency. Negative for dysuria, urgency and hematuria.  Neurological: Negative for dizziness, weakness and headaches.   Physical Exam   Blood pressure 121/78, pulse 90, temperature 98.4 F (36.9 C), temperature source Oral, resp. rate 18, last menstrual period 02/14/2012.  Physical Exam  Constitutional: She appears well-developed and well-nourished. No distress.  Cardiovascular: Normal rate and regular rhythm.  Exam reveals no gallop and no friction rub.   No murmur heard. Respiratory:  Effort normal and breath sounds normal. She has no wheezes. She has no rales.  GI: Soft. Bowel sounds are normal.  Genitourinary: There is no tenderness or lesion on the right labia. There is no tenderness or lesion on the left labia. Uterus is not tender. Cervix exhibits no discharge and no friability.  There was a small amount of old blood in the vaginal canal, but no sign of new bleeding.  There was no other discharge visualized.  There was no cervical tenderness and her cervix was closed.      MAU Course  Procedures Results for orders placed during the hospital encounter of 05/23/12 (from the past 24 hour(s))  URINALYSIS, ROUTINE W REFLEX MICROSCOPIC     Status: Abnormal   Collection Time    05/23/12 11:58 PM      Result Value Range    Color, Urine YELLOW  YELLOW   APPearance CLEAR  CLEAR   Specific Gravity, Urine 1.020  1.005 - 1.030   pH 6.5  5.0 - 8.0   Glucose, UA 500 (*) NEGATIVE mg/dL   Hgb urine dipstick MODERATE (*) NEGATIVE   Bilirubin Urine NEGATIVE  NEGATIVE   Ketones, ur NEGATIVE  NEGATIVE mg/dL   Protein, ur NEGATIVE  NEGATIVE mg/dL   Urobilinogen, UA 0.2  0.0 - 1.0 mg/dL   Nitrite NEGATIVE  NEGATIVE   Leukocytes, UA NEGATIVE  NEGATIVE  URINE MICROSCOPIC-ADD ON     Status: Abnormal   Collection Time    05/23/12 11:58 PM      Result Value Range   Squamous Epithelial / LPF RARE  RARE   WBC, UA 0-2  <3 WBC/hpf   RBC / HPF 3-6  <3 RBC/hpf   Bacteria, UA FEW (*) RARE    US Ob Comp Less 14 Wks  05/24/2012   *RADIOLOGY REPORT*  Clinical Data: Twin pregnancy, bleeding.  TWIN OBSTETRICAL ULTRASOUND <14 WKS:  Comparison:  05/16/2012  TWIN 1 Intrauterine gestational sac:  Visualized/normal in shape. Yolk sac: Not visualized Embryo: Identified Cardiac Activity: Identified Heart Rate: 160 bpm  CRL:  6.2 cm       12w    5d        [place] EDC: 12/01/2012  TWIN 2 Intrauterine gestational sac:   Visualized/normal in shape. Yolk sac: Not visualized Embryo:  Identified Cardiac Activity: Identified Heart Rate: 161 bpm  CRL:  7.9       14w    0d           [place] EDC: 11/22/2012  Maternal uterus/adnexae: Small subchorionic hemorrhage.  Ovaries not visualized.  IMPRESSION: Twin pregnancy with cardiac activity documented within each embryo as above.  Small subchorionic hemorrhage.   Original Report Authenticated By: Jearld Lesch, M.D.   US Ob Comp Less 14 Wks  05/16/2012   OBSTETRICAL ULTRASOUND: This exam was performed within a [place] Ultrasound Department. The OB US report was generated in the AS system, and faxed to the ordering physician.   This report is also available in [employer] and in the [employer]. See AS Obstetric US report.  US Ob Comp Addl Gest Less 14 Wks  05/24/2012   *RADIOLOGY REPORT*   Clinical Data: Twin pregnancy, bleeding.  TWIN OBSTETRICAL ULTRASOUND <14 WKS:  Comparison:  05/16/2012  TWIN 1 Intrauterine gestational sac:  Visualized/normal in shape. Yolk sac: Not visualized Embryo: Identified Cardiac Activity: Identified Heart Rate: 160 bpm  CRL:  6.2 cm       12w  5d        [place] EDC: 12/01/2012  TWIN 2 Intrauterine gestational sac:   Visualized/normal in shape. Yolk sac: Not visualized Embryo:  Identified Cardiac Activity: Identified Heart Rate: 161 bpm  CRL:  7.9       14w    0d           [place] EDC: 11/22/2012  Maternal uterus/adnexae: Small subchorionic hemorrhage.  Ovaries not visualized.  IMPRESSION: Twin pregnancy with cardiac activity documented within each embryo as above.  Small subchorionic hemorrhage.   Original Report Authenticated By: Jearld Lesch, M.D.   US Ob Comp Addl Gest Less 14 Wks  05/16/2012   OBSTETRICAL ULTRASOUND: This exam was performed within a [place] Ultrasound Department. The OB US report was generated in the AS system, and faxed to the ordering physician.   This report is also available in [employer] and in the [employer]. See AS Obstetric US report. MDM  Assessment and Plan  A: Urinary cystitis  P:  1) We will send her urine for culture, but treat for suspected infection in the interm with Macrobid 100mg  PO BID x5 days.   2) She was instructed to stay hydrated as this will only help her condition. 3) She was further educated that should she develop a worsening condition, should she develop fever to 100.4 degrees F, or should she develop flank pain, she is to return to the MAU immediately. 4) Proceed to your previously made appointment in the [facility] for Monday.    Anna Genre 05/24/2012, 12:46 AM   I was present for the exam and agree with above. 1. Vaginal bleeding in pregnancy, second trimester-Subchorionic Hemorrhage  2. UTI in pregnancy, antepartum, second trimester    Pelvic rest  [place] 05/24/2012 5:28 AM

## 2012-05-24 NOTE — MAU Provider Note (Signed)
Attestation of Attending Supervision of Advanced Practitioner (CNM/NP): Evaluation and management procedures were performed by the Advanced Practitioner under my supervision and collaboration.  I have reviewed the Advanced Practitioner's note and chart, and I agree with the management and plan.  Lyam Provencio 05/24/2012 7:06 AM

## 2012-05-27 ENCOUNTER — Ambulatory Visit (INDEPENDENT_AMBULATORY_CARE_PROVIDER_SITE_OTHER): Payer: Medicaid Other | Admitting: Obstetrics and Gynecology

## 2012-05-27 ENCOUNTER — Encounter: Payer: Self-pay | Admitting: Obstetrics and Gynecology

## 2012-05-27 VITALS — BP 115/77 | Temp 97.5°F | Wt 220.0 lb

## 2012-05-27 DIAGNOSIS — O30049 Twin pregnancy, dichorionic/diamniotic, unspecified trimester: Secondary | ICD-10-CM

## 2012-05-27 DIAGNOSIS — O24912 Unspecified diabetes mellitus in pregnancy, second trimester: Secondary | ICD-10-CM

## 2012-05-27 DIAGNOSIS — O0992 Supervision of high risk pregnancy, unspecified, second trimester: Secondary | ICD-10-CM

## 2012-05-27 DIAGNOSIS — O24919 Unspecified diabetes mellitus in pregnancy, unspecified trimester: Secondary | ICD-10-CM

## 2012-05-27 DIAGNOSIS — O30042 Twin pregnancy, dichorionic/diamniotic, second trimester: Secondary | ICD-10-CM

## 2012-05-27 DIAGNOSIS — O30009 Twin pregnancy, unspecified number of placenta and unspecified number of amniotic sacs, unspecified trimester: Secondary | ICD-10-CM

## 2012-05-27 LAB — POCT URINALYSIS DIP (DEVICE)
Ketones, ur: NEGATIVE mg/dL
Protein, ur: 30 mg/dL — AB
Specific Gravity, Urine: >=1.03 (ref 1.005–1.030)
Urobilinogen, UA: 1 mg/dL (ref 0.0–1.0)
pH: 6.5 (ref 5.0–8.0)

## 2012-05-27 MED ORDER — GLYBURIDE 2.5 MG PO TABS: 2.5000 mg | ORAL_TABLET | Freq: Two times a day (BID) | ORAL | Status: DC

## 2012-05-27 MED ORDER — METFORMIN HCL 500 MG PO TABS: 500.0000 mg | ORAL_TABLET | Freq: Two times a day (BID) | ORAL | Status: DC

## 2012-05-27 NOTE — Progress Notes (Signed)
CBG f 87-163 (1/12 normal) 2hr p B 101-186 (4/8 abnormal) 2hr p L 82-192 (7/10 abnormal) 2hr p D 199-266. Will start glyburide 2.5 mg BID. Will check baseline labs. Patient interested in [place] screen since she did not have [place] screen done

## 2012-05-27 NOTE — Progress Notes (Signed)
Pulse

## 2012-05-31 ENCOUNTER — Other Ambulatory Visit (HOSPITAL_COMMUNITY): Payer: Self-pay | Admitting: Medical

## 2012-05-31 ENCOUNTER — Inpatient Hospital Stay (HOSPITAL_COMMUNITY)
Admission: AD | Admit: 2012-05-31 | Discharge: 2012-06-01 | DRG: 779 | Disposition: A | Discharge status: Home / Self Care | Payer: Medicaid Other | Source: Ambulatory Visit | Attending: Obstetrics & Gynecology | Admitting: Obstetrics & Gynecology

## 2012-05-31 ENCOUNTER — Encounter (HOSPITAL_COMMUNITY): Payer: Self-pay | Admitting: *Deleted

## 2012-05-31 ENCOUNTER — Inpatient Hospital Stay (HOSPITAL_COMMUNITY): Payer: Medicaid Other

## 2012-05-31 DIAGNOSIS — O034 Incomplete spontaneous abortion without complication: Principal | ICD-10-CM | POA: Diagnosis present

## 2012-05-31 DIAGNOSIS — R109 Unspecified abdominal pain: Secondary | ICD-10-CM | POA: Diagnosis present

## 2012-05-31 DIAGNOSIS — O30049 Twin pregnancy, dichorionic/diamniotic, unspecified trimester: Secondary | ICD-10-CM

## 2012-05-31 DIAGNOSIS — O039 Complete or unspecified spontaneous abortion without complication: Secondary | ICD-10-CM

## 2012-05-31 DIAGNOSIS — O30009 Twin pregnancy, unspecified number of placenta and unspecified number of amniotic sacs, unspecified trimester: Secondary | ICD-10-CM | POA: Diagnosis present

## 2012-05-31 LAB — URINALYSIS, ROUTINE W REFLEX MICROSCOPIC
Bilirubin Urine: NEGATIVE
Glucose, UA: >1000 mg/dL — AB
Ketones, ur: 15 mg/dL — AB
pH: 6 (ref 5.0–8.0)

## 2012-05-31 MED ORDER — HYDROMORPHONE HCL PF 1 MG/ML IJ SOLN
0.2000 mg | INTRAMUSCULAR | Status: DC | PRN
  Administered 2012-05-31: 0.6 mg via INTRAVENOUS

## 2012-05-31 MED ORDER — ONDANSETRON HCL 4 MG/2ML IJ SOLN: 4.0000 mg | Freq: Four times a day (QID) | INTRAMUSCULAR | Status: DC | PRN

## 2012-05-31 MED ORDER — GLYBURIDE 2.5 MG PO TABS
2.5000 mg | ORAL_TABLET | Freq: Two times a day (BID) | ORAL | Status: DC
  Filled 2012-05-31 (×2): qty 1

## 2012-05-31 MED ORDER — ZOLPIDEM TARTRATE 5 MG PO TABS
5.0000 mg | ORAL_TABLET | Freq: Every evening | ORAL | Status: DC | PRN
  Administered 2012-05-31: 5 mg via ORAL
  Filled 2012-05-31: qty 1

## 2012-05-31 MED ORDER — SODIUM CHLORIDE 0.9 % IV SOLN
INTRAVENOUS | Status: DC
  Administered 2012-05-31: 20 mL via INTRAVENOUS

## 2012-05-31 MED ORDER — PRENATAL MULTIVITAMIN CH: 1.0000 | ORAL_TABLET | Freq: Every day | ORAL | Status: DC

## 2012-05-31 MED ORDER — OXYCODONE-ACETAMINOPHEN 5-325 MG PO TABS
1.0000 | ORAL_TABLET | ORAL | Status: DC | PRN
  Administered 2012-05-31 – 2012-06-01 (×3): 2 via ORAL
  Filled 2012-05-31 (×3): qty 2

## 2012-05-31 MED ORDER — NITROFURANTOIN MONOHYD MACRO 100 MG PO CAPS
100.0000 mg | ORAL_CAPSULE | Freq: Two times a day (BID) | ORAL | Status: DC
  Administered 2012-05-31 – 2012-06-01 (×2): 100 mg via ORAL
  Filled 2012-05-31 (×2): qty 1

## 2012-05-31 MED ORDER — OXYTOCIN 10 UNIT/ML IJ SOLN
40.0000 [IU] | INTRAVENOUS | Status: DC
  Administered 2012-05-31: 40 [IU] via INTRAVENOUS
  Filled 2012-05-31: qty 4

## 2012-05-31 MED ORDER — MISOPROSTOL 200 MCG PO TABS: 400.0000 ug | ORAL_TABLET | Freq: Once | ORAL | Status: DC

## 2012-05-31 MED ORDER — MISOPROSTOL 50MCG HALF TABLET
50.0000 ug | ORAL_TABLET | ORAL | Status: DC
  Filled 2012-05-31 (×7): qty 1

## 2012-05-31 MED ORDER — HYDROMORPHONE HCL PF 1 MG/ML IJ SOLN: 2.0000 mg | INTRAMUSCULAR | Status: DC | PRN

## 2012-05-31 MED ORDER — HYDROMORPHONE HCL PF 1 MG/ML IJ SOLN
2.0000 mg | INTRAMUSCULAR | Status: DC | PRN
  Administered 2012-05-31: 2 mg via INTRAVENOUS

## 2012-05-31 MED ORDER — ACETAMINOPHEN 325 MG PO TABS: 650.0000 mg | ORAL_TABLET | ORAL | Status: DC | PRN

## 2012-05-31 MED ORDER — HYDROMORPHONE HCL PF 1 MG/ML IJ SOLN
1.0000 mg | Freq: Once | INTRAMUSCULAR | Status: AC
  Administered 2012-05-31: 1 mg via INTRAMUSCULAR
  Filled 2012-05-31: qty 1

## 2012-05-31 MED ORDER — METFORMIN HCL 500 MG PO TABS
500.0000 mg | ORAL_TABLET | Freq: Two times a day (BID) | ORAL | Status: DC
  Administered 2012-05-31 – 2012-06-01 (×2): 500 mg via ORAL
  Filled 2012-05-31 (×2): qty 1

## 2012-05-31 MED ORDER — MISOPROSTOL 200 MCG PO TABS
50.0000 ug | ORAL_TABLET | ORAL | Status: DC
  Administered 2012-05-31: 50 ug via ORAL
  Filled 2012-05-31: qty 1

## 2012-05-31 MED ORDER — ONDANSETRON HCL 4 MG PO TABS
4.0000 mg | ORAL_TABLET | Freq: Four times a day (QID) | ORAL | Status: DC | PRN
  Administered 2012-05-31: 4 mg via ORAL
  Filled 2012-05-31: qty 1

## 2012-05-31 NOTE — Progress Notes (Signed)
Pt's mother, sister, boyfriend and babies' godmother were bedside when I arrived. We talked about pt's emotions and feelings. Pt expressed her faith, but admitted she was nervous and scared. After a period of allowing family to talk and cry, I offered comfort, support and spiritual care through prayer. Pt and family were grateful for visit. I encouraged them to call again if they felt they needed additional support. Marjory Lies Chaplain  05/31/12 1000  Clinical Encounter Type  Visited With Patient and family together

## 2012-05-31 NOTE — H&P (Signed)
Julia Willis is a 22 y.o. female G1P0 at [redacted]w[redacted]d with twin gestation presenting for rupture of membranes.  History HPI  [name] is a 22 y.o. G1P0 at [redacted]w[redacted]d with twin gestation who presents to MAU today with complaint of lower abdominal pain that started early this morning. The patient denies bleeding, discharge or LOF. However, she states that she was urinating a lot this morning and noticed a large quantity of fluid coming out. She states that the pain is severe but comes in waves off and on. No fever/chills, nausea or vomiting.    OB History   Grav Para Term Preterm Abortions TAB SAB Ect Mult Living   1              Past Medical History  Diagnosis Date  . Acid reflux   . Gonorrhea June 2013  . Diabetes mellitus     metformin   Past Surgical History  Procedure Laterality Date  . No past surgeries     Family History: family history includes Cancer in her maternal uncle; Diabetes in her maternal aunt, maternal uncle, and mother; Heart disease in her father; and Hypertension in her father, maternal aunt, maternal uncle, paternal aunt, and paternal uncle. Social History:  reports that she has never smoked. She does not have any smokeless tobacco history on file. She reports that she does not drink alcohol or use illicit drugs.   ROS  Pertinent pos and neg mentioned in HPI    Blood pressure 129/86, pulse 100, temperature 97.5 F (36.4 C), temperature source Oral, resp. rate 20, height 5\' 6"  (1.676 m), weight 99.791 kg (220 lb), last menstrual period 02/14/2012, SpO2 100.00%. Exam   Physical Exam   Physical Exam  Constitutional: She is oriented to person, place, and time. She appears well-developed and well-nourished. No distress.  HENT:  Head: Normocephalic and atraumatic.  Cardiovascular: Normal rate.  Respiratory: Effort normal.  GI: Soft.  Genitourinary: Uterus is enlarged. No bleeding around the vagina. Vaginal discharge (LOF) found.  Foot of fetus A is visible at  the cervical os  Neurological: She is alert and oriented to person, place, and time.  Skin: Skin is warm and dry. No erythema.  Psychiatric: She has a normal mood and affect.   LIMITED ULTRASOUND: Di/Di twins with Twin A positioned in endocervical canal extending to the level of the vaginal vault with a bradycardic rate of 79 bpm. Twin B is positioned transverse and has a normal cardiac rate and normal surrounding fluid. Cervix open.   Prenatal labs: ABO, Rh: O/POS/-- (05/05 1112) Antibody: NEG (05/05 1112) Rubella: 1.27 (05/05 1112) RPR: NON REAC (05/05 1112)  HBsAg: NEGATIVE (05/05 1112)  HIV: NON REACTIVE (05/05 1112)  GBS:     Assessment/Plan: 22 y.o. G1P0 at [redacted]w[redacted]d with di/di twin gestation. Twin A membrane ruptured and fetus in endocervical canal. Cervix open. - Discussed imminent miscarriage of Twin A with high risk for infection and poor prognosis for Twin B. - Pt understands that best option is delivery of both twins - Will use vaginal cytotec - Anticipate vaginal delivery  Napoleon Form 05/31/2012, 11:38 AM

## 2012-05-31 NOTE — Progress Notes (Signed)
Patient ID: Julia Willis, female   DOB: March 30, 1990, 22 y.o.   MRN: [id number] Called by RN informing me of delivery of twin B along with placenta. Patient lying in bed comfortably, reporting that pain is significantly improved. Upon inspection of placenta, only one placenta was present with blood clots. Pelvic exam was performed with extraction of second placenta. Bedside ultrasound was performed revealing a thin endometrial stripe. Patient will receive IV pitocin. After a few hours of observation, if patient remains stable, she may be able to go home.

## 2012-05-31 NOTE — Progress Notes (Signed)
Called by nurse informing me that baby A delivered. Patient was found in sitting on the toilet with nurse holding fetus in hand still attached to umbilical cord. Cord was cut. Patient complaining of cramping pain. Advised patient to move to bed. Will give 2 mg dilaudid and examine patient for status on baby B

## 2012-05-31 NOTE — MAU Provider Note (Signed)
History     CSN: [id number]  Arrival date and time: 05/31/12 4098  Provider first contact 05/31/12 at 0800    Chief Complaint  Patient presents with  . Abdominal Cramping   HPI [name] is a 22 y.o. G1P0 at [redacted]w[redacted]d with twin gestation who presents to MAU today with complaint of lower abdominal pain that started early this morning. The patient denies bleeding, discharge or LOF. She states that the pain is severe but comes in waves off and on.   OB History   Grav Para Term Preterm Abortions TAB SAB Ect Mult Living   1               Past Medical History  Diagnosis Date  . Acid reflux   . Gonorrhea June 2013  . Diabetes mellitus     metformin    Past Surgical History  Procedure Laterality Date  . No past surgeries      Family History  Problem Relation Age of Onset  . Diabetes Mother   . Hypertension Father   . Heart disease Father   . Diabetes Maternal Aunt   . Hypertension Maternal Aunt   . Diabetes Maternal Uncle   . Hypertension Maternal Uncle   . Cancer Maternal Uncle   . Hypertension Paternal Aunt   . Hypertension Paternal Uncle     History  Substance Use Topics  . Smoking status: Never Smoker   . Smokeless tobacco: Not on file  . Alcohol Use: No    Allergies: No Known Allergies  Prescriptions prior to admission  Medication Sig Dispense Refill  . docusate sodium (COLACE) 100 MG capsule Take 100 mg by mouth 2 (two) times daily.      [name] glyBURIDE (DIABETA) 2.5 MG tablet Take 1 tablet (2.5 mg total) by mouth 2 (two) times daily with a meal.  60 tablet  3  . metFORMIN (GLUCOPHAGE) 500 MG tablet Take 1 tablet (500 mg total) by mouth 2 (two) times daily with a meal.  60 tablet  1  . nitrofurantoin, macrocrystal-monohydrate, (MACROBID) 100 MG capsule Take 1 capsule (100 mg total) by mouth 2 (two) times daily.  14 capsule  0  . phenazopyridine (PYRIDIUM) 200 MG tablet Take 1 tablet (200 mg total) by mouth 3 (three) times daily as needed for pain.  10  tablet  0  . Prenatal Vit-Fe Fumarate-FA (PRENATAL MULTIVITAMIN) TABS Take 1 tablet by mouth daily at 12 noon.        Review of Systems  Constitutional: Negative for fever.  Gastrointestinal: Positive for abdominal pain.  Genitourinary:       Neg - vaginal bleeding, discharge, LOF   Physical Exam   Blood pressure 148/90, pulse 88, temperature 98.1 F (36.7 C), temperature source Oral, resp. rate 22, height 5\' 6"  (1.676 m), weight 99.791 kg (220 lb), last menstrual period 02/14/2012, SpO2 100.00%.  Physical Exam  Constitutional: She is oriented to person, place, and time. She appears well-developed and well-nourished. No distress.  HENT:  Head: Normocephalic and atraumatic.  Cardiovascular: Normal rate.   Respiratory: Effort normal.  GI: Soft.  Genitourinary: Uterus is enlarged. No bleeding around the vagina. Vaginal discharge (LOF) found.  Foot of fetus A is visible at the cervical os  Neurological: She is alert and oriented to person, place, and time.  Skin: Skin is warm and dry. No erythema.  Psychiatric: She has a normal mood and affect.   Results for orders placed during the hospital encounter of 05/31/12 (  from the past 24 hour(s))  URINALYSIS, ROUTINE W REFLEX MICROSCOPIC     Status: Abnormal   Collection Time    05/31/12  6:50 AM      Result Value Range   Color, Urine ORANGE (*) YELLOW   APPearance CLEAR  CLEAR   Specific Gravity, Urine >1.030 (*) 1.005 - 1.030   pH 6.0  5.0 - 8.0   Glucose, UA >1000 (*) NEGATIVE mg/dL   Hgb urine dipstick MODERATE (*) NEGATIVE   Bilirubin Urine NEGATIVE  NEGATIVE   Ketones, ur 15 (*) NEGATIVE mg/dL   Protein, ur 30 (*) NEGATIVE mg/dL   Urobilinogen, UA 2.0 (*) 0.0 - 1.0 mg/dL   Nitrite POSITIVE (*) NEGATIVE   Leukocytes, UA NEGATIVE  NEGATIVE  URINE MICROSCOPIC-ADD ON     Status: None   Collection Time    05/31/12  6:50 AM      Result Value Range   Squamous Epithelial / LPF RARE  RARE   WBC, UA 3-6  <3 WBC/hpf     MAU  Course  Procedures None  MDM Discussed with Dr. Macon Large. Will admit to AICU.  GYN admission orders per Dr. Jolayne Panther. She will present to AICU to discuss process with the patient  Assessment and Plan  A: Miscarriage of fetus A at 14w 3d  P: Admit to AICU  Freddi Starr, PA-C  05/31/2012, 8:18 AM

## 2012-05-31 NOTE — Progress Notes (Signed)
I followed up two times with the family.  They were holding their babies and were tearful.  I offered grief support and grief education as well as compassionate listening.  [employer] Pager, [phone] 4:06 PM   05/31/12 1600  Clinical Encounter Type  Visited With Patient and family together  Visit Type Spiritual support  Referral From Chaplain  Spiritual Encounters  Spiritual Needs Emotional;Grief support  Stress Factors  Patient Stress Factors Loss

## 2012-05-31 NOTE — Progress Notes (Signed)
U/S called for assistance in transporting patient due to fetus noted at cervical os.  Pt transported back to MAU via bed.  Zorita Pang, CNM notified and came to assess pt.  Pelvic exam revealed ROM and fetus coming through cervix.  H. Mathews Robinsons, CNM notified pt of findings.  Pt to be admitted. Pt upset and crying. Significant other at bedside, called pt mother.

## 2012-05-31 NOTE — MAU Note (Signed)
Pt states she has been cramping since midnight-has gotten worse and worse

## 2012-06-01 MED ORDER — IBUPROFEN 600 MG PO TABS: 600.0000 mg | ORAL_TABLET | Freq: Four times a day (QID) | ORAL | Status: DC | PRN

## 2012-06-01 NOTE — Discharge Summary (Signed)
Obstetric Discharge Summary Reason for Admission: onset of labor and rupture of membranes in a twin gestation at [redacted]w[redacted]d  Prenatal Procedures: none Intrapartum Procedures: spontaneous vaginal delivery Postpartum Procedures: none Complications-Operative and Postpartum: none except demise of previable twins Hemoglobin  Date Value Range Status  05/16/2012 11.8* 12.0 - 15.0 g/dL Final     HCT  Date Value Range Status  05/16/2012 35.1* 36.0 - 46.0 % Final  Hospital course: Julia Willis is a 22 y.o. G1P0 at [redacted]w[redacted]d with twin gestation who presented to [facility] yesterday with complaint of lower abdominal pain that started early this morning. The patient denied bleeding, discharge or LOF. However, she stated that she was urinating a lot  and noticed a large quantity of fluid coming out. She stated that the pain is severe. No fever/chills, nausea or vomiting.  She went on to deliver both twins without incident. She had a lot of family supporting her throughout the day and night. This morning she is feeling better with a little cramping. Is ready for discharge  Physical Exam:  General: alert, cooperative and no distress Lochia: appropriate Uterine Fundus: firm Incision: n/a DVT Evaluation: No evidence of DVT seen on physical exam.  Discharge Diagnoses: Previable delivery of twins                                         Known UTI  Discharge Information: Date: 06/01/2012 Activity: unrestricted and pelvic rest Diet: routine Medications: Ibuprofen and and continue Macrobid Condition: stable Instructions: refer to practice specific booklet Discharge to: home   Newborn Data:   [place], Schreiner [[id number]]  Live born female  Birth Weight: 1 oz (28 g) APGAR: 0, 0   [place], Wherley [[id number]]  Live born female  Birth Weight: 2 oz (57 g) APGAR: 0, 0 .  [facility] 06/01/2012, 7:54 AM

## 2012-06-01 NOTE — Plan of Care (Signed)
Problem: Discharge Progression Outcomes Goal: Activity appropriate for discharge plan Outcome: Completed/Met Date Met:  06/01/12 Walks around in the room frequently and tolerates it well Goal: Tolerating diet Outcome: Completed/Met Date Met:  06/01/12 Tolerating Regular diet well Goal: Pain controlled with appropriate interventions Outcome: Completed/Met Date Met:  06/01/12 Good pain control with po Percocet

## 2012-06-02 NOTE — MAU Provider Note (Signed)
Attestation of Attending Supervision of Advanced Practitioner (CNM/NP): Evaluation and management procedures were performed by the Advanced Practitioner under my supervision and collaboration.  I have reviewed the Advanced Practitioner's note and chart, and I agree with the management and plan.  Ylianna Almanzar 06/02/2012 8:24 AM

## 2012-06-02 NOTE — H&P (Signed)
Attestation of Attending Supervision of Advanced Practitioner (CNM/NP): Evaluation and management procedures were performed by the Advanced Practitioner under my supervision and collaboration.  I have reviewed the Advanced Practitioner's note and chart, and I agree with the management and plan.  Jade Burkard 06/02/2012 8:23 AM

## 2012-06-02 NOTE — Discharge Summary (Signed)
Attestation of Attending Supervision of Advanced Practitioner (CNM/NP): Evaluation and management procedures were performed by the Advanced Practitioner under my supervision and collaboration.  I have reviewed the Advanced Practitioner's note and chart, and I agree with the management and plan.  Melenda Bielak 06/02/2012 8:24 AM   

## 2012-06-10 ENCOUNTER — Encounter: Payer: Self-pay | Admitting: Family Medicine

## 2012-06-11 ENCOUNTER — Inpatient Hospital Stay (HOSPITAL_COMMUNITY)
Admission: AD | Admit: 2012-06-11 | Discharge: 2012-06-11 | Disposition: A | Discharge status: Home / Self Care | Payer: Medicaid Other | Source: Ambulatory Visit | Attending: Obstetrics & Gynecology | Admitting: Obstetrics & Gynecology

## 2012-06-11 ENCOUNTER — Encounter (HOSPITAL_COMMUNITY): Payer: Self-pay | Admitting: *Deleted

## 2012-06-11 ENCOUNTER — Telehealth: Payer: Self-pay | Admitting: *Deleted

## 2012-06-11 DIAGNOSIS — O9122 Nonpurulent mastitis associated with the puerperium: Secondary | ICD-10-CM | POA: Insufficient documentation

## 2012-06-11 DIAGNOSIS — N61 Mastitis without abscess: Secondary | ICD-10-CM

## 2012-06-11 DIAGNOSIS — M549 Dorsalgia, unspecified: Secondary | ICD-10-CM | POA: Insufficient documentation

## 2012-06-11 DIAGNOSIS — R51 Headache: Secondary | ICD-10-CM | POA: Insufficient documentation

## 2012-06-11 MED ORDER — CEPHALEXIN 500 MG PO CAPS
500.0000 mg | ORAL_CAPSULE | Freq: Once | ORAL | Status: AC
  Administered 2012-06-11: 500 mg via ORAL
  Filled 2012-06-11: qty 1

## 2012-06-11 MED ORDER — OXYCODONE-ACETAMINOPHEN 5-325 MG PO TABS
1.0000 | ORAL_TABLET | Freq: Once | ORAL | Status: AC
  Administered 2012-06-11: 1 via ORAL
  Filled 2012-06-11: qty 1

## 2012-06-11 MED ORDER — OXYCODONE-ACETAMINOPHEN 5-325 MG PO TABS: 1.0000 | ORAL_TABLET | ORAL | Status: DC | PRN

## 2012-06-11 MED ORDER — CEPHALEXIN 500 MG PO CAPS: 500.0000 mg | ORAL_CAPSULE | Freq: Four times a day (QID) | ORAL | Status: DC

## 2012-06-11 NOTE — MAU Note (Signed)
I miscarried twins on 5/23 at 14wks. Did not have a d&c afterward. Yesterday started having back pain and headaches. Noticed L breast aliitle red and has been leaking milk. Today awoke and felt worse and breast more red

## 2012-06-11 NOTE — MAU Provider Note (Signed)
Attestation of Attending Supervision of Advanced Practitioner (CNM/NP): Evaluation and management procedures were performed by the Advanced Practitioner under my supervision and collaboration.  I have reviewed the Advanced Practitioner's note and chart, and I agree with the management and plan.  HARRAWAY-SMITH, Shalyn Koral 4:05 PM    

## 2012-06-11 NOTE — MAU Note (Signed)
Pt states miscarried on 05/31/2012 (twins). Having back pain and headache.

## 2012-06-11 NOTE — Telephone Encounter (Signed)
Patient had a miscarriage on 5/23 and delivered twins. She is complaining of headaches, breast pain, and back pain. I talked with Dr. Debroah Loop and he suggested patient be seen at urgent care or [facility] if she felt that it was an emergency. Patient agrees.

## 2012-06-11 NOTE — MAU Provider Note (Signed)
History     CSN: [id number]  Arrival date and time: 06/11/12 1411   None     Chief Complaint  Patient presents with  . Back Pain  . Chills  . Headache   HPI This is a 22 y.o. female who is about 10 days postpartum from delivery of previable 14 week twins. She noticed a few days ago that she had breast fullness, tenderness and then milk production. Started having some redness, feverishness, and aches right after that. Motrin only helps a little   RN Note: I miscarried twins on 5/23 at 14wks. Did not have a d&c afterward. Yesterday started having back pain and headaches. Noticed L breast aliitle red and has been leaking milk. Today awoke and felt worse and breast more red      OB History   Grav Para Term Preterm Abortions TAB SAB Ect Mult Living   1 1       1        Past Medical History  Diagnosis Date  . Acid reflux   . Gonorrhea June 2013  . Diabetes mellitus     metformin    Past Surgical History  Procedure Laterality Date  . No past surgeries      Family History  Problem Relation Age of Onset  . Diabetes Mother   . Hypertension Father   . Heart disease Father   . Diabetes Maternal Aunt   . Hypertension Maternal Aunt   . Diabetes Maternal Uncle   . Hypertension Maternal Uncle   . Cancer Maternal Uncle   . Hypertension Paternal Aunt   . Hypertension Paternal Uncle     History  Substance Use Topics  . Smoking status: Never Smoker   . Smokeless tobacco: Not on file  . Alcohol Use: No    Allergies: No Known Allergies  Prescriptions prior to admission  Medication Sig Dispense Refill  . docusate sodium (COLACE) 100 MG capsule Take 100 mg by mouth 2 (two) times daily.      [name] ibuprofen (ADVIL,MOTRIN) 600 MG tablet Take 1 tablet (600 mg total) by mouth every 6 (six) hours as needed for pain.  30 tablet  0  . metFORMIN (GLUCOPHAGE) 500 MG tablet Take 1 tablet (500 mg total) by mouth 2 (two) times daily with a meal.  60 tablet  1  . phenazopyridine  (PYRIDIUM) 200 MG tablet Take 1 tablet (200 mg total) by mouth 3 (three) times daily as needed for pain.  10 tablet  0  . Prenatal Vit-Fe Fumarate-FA (PRENATAL MULTIVITAMIN) TABS Take 1 tablet by mouth daily at 12 noon.        Review of Systems  Constitutional: Positive for fever, chills and malaise/fatigue.  Respiratory: Negative for cough.        Breast fullness, tenderness, redness Redness only on left  Gastrointestinal: Negative for nausea, vomiting and abdominal pain.  Musculoskeletal: Positive for myalgias.  Neurological: Positive for headaches. Negative for weakness.   Physical Exam   Blood pressure 136/84, pulse 115, temperature 98.9 F (37.2 C), resp. rate 18, height 5\' 6"  (1.676 m), weight 100.245 kg (221 lb), last menstrual period 02/14/2012, SpO2 99.00%.  Physical Exam  Constitutional: She is oriented to person, place, and time. She appears well-developed and well-nourished. No distress.  HENT:  Head: Normocephalic.  Cardiovascular: Normal rate.   Respiratory: Effort normal.  Breasts full, L>R, erethema to superior area of left breast, slight induration, no abscess   GI: Soft. There is no tenderness.  Musculoskeletal:  Normal range of motion.  Neurological: She is alert and oriented to person, place, and time.  Skin: Skin is warm and dry.  Psychiatric: She has a normal mood and affect.    MAU Course  Procedures   Assessment and Plan  A: 10 days postpartum s/p 14 week twin delivery      Acute Left mastitis P:  Will treat with Keflex x 10 days       Percocet for back pain, headache and breast pain       Followup here if not better in 3 days.  [facility] 06/11/2012, 3:41 PM

## 2012-07-01 ENCOUNTER — Encounter: Payer: Self-pay | Admitting: *Deleted

## 2012-07-01 ENCOUNTER — Ambulatory Visit: Payer: Self-pay | Admitting: Family Medicine

## 2012-07-01 ENCOUNTER — Ambulatory Visit: Payer: Medicaid Other | Admitting: Obstetrics & Gynecology

## 2012-07-01 VITALS — BP 133/93 | HR 90 | Temp 98.4°F | Ht 66.0 in | Wt 219.0 lb

## 2012-07-01 DIAGNOSIS — O039 Complete or unspecified spontaneous abortion without complication: Secondary | ICD-10-CM

## 2012-07-01 DIAGNOSIS — N898 Other specified noninflammatory disorders of vagina: Secondary | ICD-10-CM

## 2012-07-01 LAB — POCT PREGNANCY, URINE: Preg Test, Ur: NEGATIVE

## 2012-07-01 NOTE — Patient Instructions (Signed)
Etonogestrel implant What is this medicine? ETONOGESTREL is a contraceptive (birth control) device. It is used to prevent pregnancy. It can be used for up to 3 years. This medicine may be used for other purposes; ask your health care provider or pharmacist if you have questions. What should I tell my health care provider before I take this medicine? They need to know if you have any of these conditions: -abnormal vaginal bleeding -blood vessel disease or blood clots -cancer of the breast, cervix, or liver -depression -diabetes -gallbladder disease -headaches -heart disease or recent heart attack -high blood pressure -high cholesterol -kidney disease -liver disease -renal disease -seizures -tobacco smoker -an unusual or allergic reaction to etonogestrel, other hormones, anesthetics or antiseptics, medicines, foods, dyes, or preservatives -pregnant or trying to get pregnant -breast-feeding How should I use this medicine? This device is inserted just under the skin on the inner side of your upper arm by a health care professional. Talk to your pediatrician regarding the use of this medicine in children. Special care may be needed. Overdosage: If you think you've taken too much of this medicine contact a poison control center or emergency room at once. Overdosage: If you think you have taken too much of this medicine contact a poison control center or emergency room at once. NOTE: This medicine is only for you. Do not share this medicine with others. What if I miss a dose? This does not apply. What may interact with this medicine? Do not take this medicine with any of the following medications: -amprenavir -bosentan -fosamprenavir This medicine may also interact with the following medications: -barbiturate medicines for inducing sleep or treating seizures -certain medicines for fungal infections like ketoconazole and itraconazole -griseofulvin -medicines to treat seizures like  carbamazepine, felbamate, oxcarbazepine, phenytoin, topiramate -modafinil -phenylbutazone -rifampin -some medicines to treat HIV infection like atazanavir, indinavir, lopinavir, nelfinavir, tipranavir, ritonavir -St. John's wort This list may not describe all possible interactions. Give your health care provider a list of all the medicines, herbs, non-prescription drugs, or dietary supplements you use. Also tell them if you smoke, drink alcohol, or use illegal drugs. Some items may interact with your medicine. What should I watch for while using this medicine? This product does not protect you against HIV infection (AIDS) or other sexually transmitted diseases. You should be able to feel the implant by pressing your fingertips over the skin where it was inserted. Tell your doctor if you cannot feel the implant. What side effects may I notice from receiving this medicine? Side effects that you should report to your doctor or health care professional as soon as possible: -allergic reactions like skin rash, itching or hives, swelling of the face, lips, or tongue -breast lumps -changes in vision -confusion, trouble speaking or understanding -dark urine -depressed mood -general ill feeling or flu-like symptoms -light-colored stools -loss of appetite, nausea -right upper belly pain -severe headaches -severe pain, swelling, or tenderness in the abdomen -shortness of breath, chest pain, swelling in a leg -signs of pregnancy -sudden numbness or weakness of the face, arm or leg -trouble walking, dizziness, loss of balance or coordination -unusual vaginal bleeding, discharge -unusually weak or tired -yellowing of the eyes or skin Side effects that usually do not require medical attention (Report these to your doctor or health care professional if they continue or are bothersome.): -acne -breast pain -changes in weight -cough -fever or chills -headache -irregular menstrual  bleeding -itching, burning, and vaginal discharge -pain or difficulty passing urine -sore throat This   list may not describe all possible side effects. Call your doctor for medical advice about side effects. You may report side effects to FDA at 1-800-FDA-1088. Where should I keep my medicine? This drug is given in a hospital or clinic and will not be stored at home. NOTE: This sheet is a summary. It may not cover all possible information. If you have questions about this medicine, talk to your doctor, pharmacist, or health care provider.  2013, Elsevier/Gold Standard. (09/18/2008 3:54:17 PM) Contraception Choices Contraception (birth control) is the use of any methods or devices to prevent pregnancy. Below are some methods to help avoid pregnancy. HORMONAL METHODS   Contraceptive implant. This is a thin, plastic tube containing progesterone hormone. It does not contain estrogen hormone. Your caregiver inserts the tube in the inner part of the upper arm. The tube can remain in place for up to 3 years. After 3 years, the implant must be removed. The implant prevents the ovaries from releasing an egg (ovulation), thickens the cervical mucus which prevents sperm from entering the uterus, and thins the lining of the inside of the uterus.  Progesterone-only injections. These injections are given every 3 months by your caregiver to prevent pregnancy. This synthetic progesterone hormone stops the ovaries from releasing eggs. It also thickens cervical mucus and changes the uterine lining. This makes it harder for sperm to survive in the uterus.  Birth control pills. These pills contain estrogen and progesterone hormone. They work by stopping the egg from forming in the ovary (ovulation). Birth control pills are prescribed by a caregiver.Birth control pills can also be used to treat heavy periods.  Minipill. This type of birth control pill contains only the progesterone hormone. They are taken every day of  each month and must be prescribed by your caregiver.  Birth control patch. The patch contains hormones similar to those in birth control pills. It must be changed once a week and is prescribed by a caregiver.  Vaginal ring. The ring contains hormones similar to those in birth control pills. It is left in the vagina for 3 weeks, removed for 1 week, and then a new one is put back in place. The patient must be comfortable inserting and removing the ring from the vagina.A caregiver's prescription is necessary.  Emergency contraception. Emergency contraceptives prevent pregnancy after unprotected sexual intercourse. This pill can be taken right after sex or up to 5 days after unprotected sex. It is most effective the sooner you take the pills after having sexual intercourse. Emergency contraceptive pills are available without a prescription. Check with your pharmacist. Do not use emergency contraception as your only form of birth control. BARRIER METHODS   Female condom. This is a thin sheath (latex or rubber) that is worn over the penis during sexual intercourse. It can be used with spermicide to increase effectiveness.  Female condom. This is a soft, loose-fitting sheath that is put into the vagina before sexual intercourse.  Diaphragm. This is a soft, latex, dome-shaped barrier that must be fitted by a caregiver. It is inserted into the vagina, along with a spermicidal jelly. It is inserted before intercourse. The diaphragm should be left in the vagina for 6 to 8 hours after intercourse.  Cervical cap. This is a round, soft, latex or plastic cup that fits over the cervix and must be fitted by a caregiver. The cap can be left in place for up to 48 hours after intercourse.  Sponge. This is a soft, circular piece of  polyurethane foam. The sponge has spermicide in it. It is inserted into the vagina after wetting it and before sexual intercourse.  Spermicides. These are chemicals that kill or block sperm  from entering the cervix and uterus. They come in the form of creams, jellies, suppositories, foam, or tablets. They do not require a prescription. They are inserted into the vagina with an applicator before having sexual intercourse. The process must be repeated every time you have sexual intercourse. INTRAUTERINE CONTRACEPTION  Intrauterine device (IUD). This is a T-shaped device that is put in a woman's uterus during a menstrual period to prevent pregnancy. There are 2 types:  Copper IUD. This type of IUD is wrapped in copper wire and is placed inside the uterus. Copper makes the uterus and fallopian tubes produce a fluid that kills sperm. It can stay in place for 10 years.  Hormone IUD. This type of IUD contains the hormone progestin (synthetic progesterone). The hormone thickens the cervical mucus and prevents sperm from entering the uterus, and it also thins the uterine lining to prevent implantation of a fertilized egg. The hormone can weaken or kill the sperm that get into the uterus. It can stay in place for 5 years. PERMANENT METHODS OF CONTRACEPTION  Female tubal ligation. This is when the woman's fallopian tubes are surgically sealed, tied, or blocked to prevent the egg from traveling to the uterus.  Female sterilization. This is when the female has the tubes that carry sperm tied off (vasectomy).This blocks sperm from entering the vagina during sexual intercourse. After the procedure, the man can still ejaculate fluid (semen). NATURAL PLANNING METHODS  Natural family planning. This is not having sexual intercourse or using a barrier method (condom, diaphragm, cervical cap) on days the woman could become pregnant.  Calendar method. This is keeping track of the length of each menstrual cycle and identifying when you are fertile.  Ovulation method. This is avoiding sexual intercourse during ovulation.  Symptothermal method. This is avoiding sexual intercourse during ovulation, using a  thermometer and ovulation symptoms.  Post-ovulation method. This is timing sexual intercourse after you have ovulated. Regardless of which type or method of contraception you choose, it is important that you use condoms to protect against the transmission of sexually transmitted diseases (STDs). Talk with your caregiver about which form of contraception is most appropriate for you. Document Released: 12/26/2004 Document Revised: 03/20/2011 Document Reviewed: 05/04/2010 [facility] Patient Information 2014 [place], [place].

## 2012-07-01 NOTE — Progress Notes (Unsigned)
Patient ID: Julia Willis, female   DOB: December 18, 1990, 22 y.o.   MRN: [id number] Subjective:     Julia Willis is a 22 y.o. female who presents for a postpartum visit. She is 1 month postpartum following a spontaneous miscarriage of twins at 14 weeks. I have fully reviewed the prenatal and intrapartum course. The delivery was at 14 gestational weeks. Outcome: spontaneous abortion. Anesthesia: none. Postpartum course has been normal.. Bowel function is normal. Bladder function is normal. Patient is not sexually active. Contraception method is unsure, considering Nexplanon. Postpartum depression screening: negative.  The following portions of the patient's history were reviewed and updated as appropriate: allergies, current medications, past family history, past medical history, past social history, past surgical history and problem list.  Review of Systems Genitourinary:positive for vaginal discharge   Objective:    BP 133/93  Pulse 90  Temp(Src) 98.4 F (36.9 C) (Oral)  Ht 5\' 6"  (1.676 m)  Wt 219 lb (99.338 kg)  BMI 35.36 kg/m2  General:  alert and cooperative        Heart:    Abdomen: soft, non-tender; bowel sounds normal; no masses,  no organomegaly   Vulva:  normal  Vagina: normal vagina wet prep done  Cervix:  no lesions  Corpus: normal  Adnexa:  normal adnexa  Rectal Exam: Not performed.        Assessment:     normal postpartum exam. Pap smear not done at today's visit.   Plan:    1. Contraception: Nexplanon: considering 2. Info on BCM 3. Follow up in: 4 weeks or as needed.   Adam Phenix, MD 07/01/2012

## 2012-07-02 LAB — WET PREP, GENITAL

## 2012-07-04 ENCOUNTER — Telehealth: Payer: Self-pay | Admitting: General Practice

## 2012-07-04 NOTE — Telephone Encounter (Signed)
Patient called and left message stating she had an appt on 6/25 and would like the lab results. Called patient back and informed her that I was returning her phone call and that her wet prep from the 6/23 appt was normal. Patient verbalized understanding and had no further questions

## 2012-07-13 ENCOUNTER — Emergency Department (HOSPITAL_COMMUNITY)
Admission: EM | Admit: 2012-07-13 | Discharge: 2012-07-13 | Disposition: A | Discharge status: Home / Self Care | Payer: Medicaid Other | Attending: Emergency Medicine | Admitting: Emergency Medicine

## 2012-07-13 ENCOUNTER — Encounter (HOSPITAL_COMMUNITY): Payer: Self-pay

## 2012-07-13 DIAGNOSIS — E119 Type 2 diabetes mellitus without complications: Secondary | ICD-10-CM | POA: Insufficient documentation

## 2012-07-13 DIAGNOSIS — IMO0002 Reserved for concepts with insufficient information to code with codable children: Secondary | ICD-10-CM | POA: Insufficient documentation

## 2012-07-13 DIAGNOSIS — Z79899 Other long term (current) drug therapy: Secondary | ICD-10-CM | POA: Insufficient documentation

## 2012-07-13 DIAGNOSIS — K219 Gastro-esophageal reflux disease without esophagitis: Secondary | ICD-10-CM | POA: Insufficient documentation

## 2012-07-13 DIAGNOSIS — F1210 Cannabis abuse, uncomplicated: Secondary | ICD-10-CM | POA: Insufficient documentation

## 2012-07-13 DIAGNOSIS — L299 Pruritus, unspecified: Secondary | ICD-10-CM | POA: Insufficient documentation

## 2012-07-13 DIAGNOSIS — Z8619 Personal history of other infectious and parasitic diseases: Secondary | ICD-10-CM | POA: Insufficient documentation

## 2012-07-13 DIAGNOSIS — M62838 Other muscle spasm: Secondary | ICD-10-CM | POA: Insufficient documentation

## 2012-07-13 LAB — RAPID URINE DRUG SCREEN, HOSP PERFORMED
Amphetamines: NOT DETECTED
Benzodiazepines: NOT DETECTED
Cocaine: NOT DETECTED
Opiates: NOT DETECTED

## 2012-07-13 MED ORDER — DIPHENHYDRAMINE HCL 25 MG PO TABS: ORAL_TABLET | ORAL | Status: DC

## 2012-07-13 MED ORDER — DIPHENHYDRAMINE HCL 50 MG/ML IJ SOLN
25.0000 mg | Freq: Once | INTRAMUSCULAR | Status: AC
  Administered 2012-07-13: 25 mg via INTRAVENOUS
  Filled 2012-07-13: qty 1

## 2012-07-13 NOTE — ED Notes (Signed)
Pt c/o abdominal pain and muscle twitching.  Per EMS, pt states she smoked marijuana with friends last night.  Pt states that one friend was admitted last night due to same symptoms.  Pt ambulatory from EMS bay to pt room.  Pt twitching upper extremities and head.  Pt stopped twitching when asked to sign EMS pad.  Pt denies N/V.

## 2012-07-13 NOTE — ED Provider Notes (Signed)
I saw and evaluated the patient, reviewed the resident's note and I agree with the findings and plan.   .Face to face Exam:  General:  Awake HEENT:  Atraumatic Resp:  Normal effort Abd:  Nondistended Neuro:No focal weakness   Nelia Shi, MD 07/13/12 2049

## 2012-07-13 NOTE — ED Notes (Addendum)
Pt now states she is itching.  No rashes seen.  Pt was twitching upon arrival with EMS, no twitching seen now.  Pt now denies abdominal pain.

## 2012-07-13 NOTE — ED Provider Notes (Signed)
History    CSN: [id number] Arrival date & time 07/13/12  1233  First MD Initiated Contact with Patient 07/13/12 1308     Chief Complaint  Patient presents with  . Abdominal Pain  . Spasms    muscle twitching   (Consider location/radiation/quality/duration/timing/severity/associated sxs/prior Treatment) HPI Comments: Pt is a 22yo female who presents with initial complaints of "twitching"/muscle spasm/involuntary movements and abdominal pain. By time of this interview, pt no longer complaining of abdominal pain and twitching is "better," but pt now complaining of all-over itching and "being unability to sit still." Pt reports smoking marijuana yesterday about 5-6 PM; pt states she has not had marijuana in several years but has not had any unusual reaction to it in the past. Denies other drug use. Denies pain, swelling. Is vague about most complaints/details. Reports no allergies, no medications currently.  Otherwise has few complaints. No fever, SOB, N/V, change in bowel/bladder habits. Of note, pt recently miscarried twins at about 14w, about 5 weeks ago. Since then has not had any vaginal or urinary complaints; was seen at [facility] for mastitis. Pt was on metformin prescribed through [facility] during the pregnancy, has not taken it in >1 month.  The history is provided by the patient. No language interpreter was used.   Past Medical History  Diagnosis Date  . Acid reflux   . Gonorrhea June 2013  . Diabetes mellitus     metformin   Past Surgical History  Procedure Laterality Date  . No past surgeries     Family History  Problem Relation Age of Onset  . Diabetes Mother   . Hypertension Father   . Heart disease Father   . Diabetes Maternal Aunt   . Hypertension Maternal Aunt   . Diabetes Maternal Uncle   . Hypertension Maternal Uncle   . Cancer Maternal Uncle   . Hypertension Paternal Aunt   . Hypertension Paternal Uncle    History  Substance Use Topics  . Smoking status:  Never Smoker   . Smokeless tobacco: Not on file  . Alcohol Use: No   OB History   Grav Para Term Preterm Abortions TAB SAB Ect Mult Living   1 1       1       Review of Systems  Constitutional: Negative for fever, chills, activity change and appetite change.  HENT: Negative for congestion, sore throat, rhinorrhea and neck stiffness.   Respiratory: Negative for cough, shortness of breath and wheezing.   Gastrointestinal: Positive for abdominal pain. Negative for nausea, vomiting, diarrhea and constipation.       Vague/intermittent, not current (see HPI)  Genitourinary: Negative for dysuria, urgency, frequency, hematuria and difficulty urinating.  Musculoskeletal: Negative for joint swelling.  Skin: Negative for rash.  Neurological: Negative for dizziness, syncope, weakness and numbness.  Psychiatric/Behavioral: Positive for agitation. Negative for suicidal ideas, hallucinations, confusion and self-injury.    Allergies  Review of patient's allergies indicates no known allergies.  Home Medications   Current Outpatient Rx  Name  Route  Sig  Dispense  Refill  . ibuprofen (ADVIL,MOTRIN) 600 MG tablet   Oral   Take 1 tablet (600 mg total) by mouth every 6 (six) hours as needed for pain.   30 tablet   0   . metFORMIN (GLUCOPHAGE) 500 MG tablet   Oral   Take 1 tablet (500 mg total) by mouth 2 (two) times daily with a meal.   60 tablet   1   .  diphenhydrAMINE (BENADRYL) 25 MG tablet      Take 1-2 tablets (25-50 mg total) by mouth every 4-6 (four to six) hours as needed.   30 tablet   1    BP 128/86  Pulse 81  Temp(Src) 98.9 F (37.2 C) (Oral)  Resp 14  SpO2 99% Physical Exam  Nursing note and vitals reviewed. Constitutional: She is oriented to person, place, and time. She appears well-developed and well-nourished. No distress.  HENT:  Head: Normocephalic and atraumatic.  Eyes: Conjunctivae are normal. Pupils are equal, round, and reactive to light.  Neck: Normal range  of motion. Neck supple.  Cardiovascular: Normal rate, regular rhythm and normal heart sounds.   No murmur heard. Pulmonary/Chest: Breath sounds normal. No respiratory distress. She has no wheezes.  Abdominal: Soft. Bowel sounds are normal. She exhibits no distension. There is no tenderness.  Musculoskeletal: Normal range of motion. She exhibits no edema and no tenderness.  Neurological: She is alert and oriented to person, place, and time. She has normal strength. No cranial nerve deficit or sensory deficit.  Fidgeting/scratching at skin from time to time in bed, but no frank twitching or spasm noted. Otherwise neurologically intact. Able to ambulate to bathroom unassisted.  Skin: Skin is warm and dry. No rash noted. She is not diaphoretic.  Psychiatric: She has a normal mood and affect. Her behavior is normal.  Pt not uncooperative but seems disinterested with interview/exam.    ED Course  Procedures (including critical care time)  1400: Pt no longer complaining of abdominal pain or frank twitching, but states she can't sit still. Urine collected. Will check UDS. Considering if marijuana yesterday could have been laced with something (?antipsychotic) that could cause involuntary muscle movement. Ordered for Benadryl 25 mg IV.  1455: Benadryl administered. Per RN, pt without complaints or needs at that time. Will reassess shortly.  1520: Pt with good relief of itching with Benadryl IV. No further involuntary movements.  Labs Reviewed  URINE RAPID DRUG SCREEN (HOSP PERFORMED)   No results found. 1. Itching   2. Muscle spasm     MDM  22yo female with vague complaints after smoking marijuana approx 18 hours ago. Involuntary movements and itching improved with Benadryl IV. Recommended Benadryl PO q4-6 hours PRN and to follow-up with a primary care doctor as needed. Red flags discussed that would indicate prompt return to emergency care.  The above was discussed in its entirety with  attending ED physician Dr. Radford Pax.   Bobbye Morton, MD  PGY-2, [facility] Medicine  Bobbye Morton, MD 07/13/12 [phone]

## 2012-10-31 ENCOUNTER — Ambulatory Visit: Payer: Medicaid Other | Attending: Internal Medicine | Admitting: Internal Medicine

## 2012-10-31 VITALS — BP 131/90 | HR 79 | Temp 98.6°F | Resp 16 | Ht 67.0 in | Wt 216.0 lb

## 2012-10-31 DIAGNOSIS — E119 Type 2 diabetes mellitus without complications: Secondary | ICD-10-CM | POA: Insufficient documentation

## 2012-10-31 DIAGNOSIS — Z0000 Encounter for general adult medical examination without abnormal findings: Secondary | ICD-10-CM

## 2012-10-31 LAB — LIPID PANEL
LDL Cholesterol: 123 mg/dL — ABNORMAL HIGH (ref 0–99)
VLDL: 27 mg/dL (ref 0–40)

## 2012-10-31 LAB — TSH: TSH: 1.16 u[IU]/mL (ref 0.350–4.500)

## 2012-10-31 MED ORDER — METFORMIN HCL 500 MG PO TABS: 500.0000 mg | ORAL_TABLET | Freq: Two times a day (BID) | ORAL | Status: DC

## 2012-10-31 NOTE — Addendum Note (Signed)
Addended by: Allayne Stack R on: 10/31/2012 10:45 AM   Modules accepted: Orders

## 2012-10-31 NOTE — Patient Instructions (Signed)
Monitoring for Diabetes  There are two blood tests that help you monitor and manage your diabetes. These include:  · An A1c (hemoglobin A1c) test.  · This test is an average of your glucose (or blood sugar) control over the past 3 months.  · This is recommended as a way for you and your caregiver to understand how well your glucose levels are controlled on the average.  · Your A1c goal will be determined by your caregiver, but it is usually best if it is less than 6.5% to 7.0%.  · Glucose (sugar) attaches itself to red blood cells. The amount of glucose then can then be measured. The amount of glucose on the cells depends on how high your blood glucose has been.  · SMBG test (self-monitoring blood glucose).  · Using a blood glucose monitor (meter) to do SMBG testing is an easy way to monitor the amount of glucose in your blood and can help you improve your control. The monitor will tell you what your blood glucose is at that very moment. Every person with diabetes should have a blood glucose monitor and know how to use it. The better you control your blood sugar on a daily basis, the better your A1c levels will be.  HOW OFTEN SHOULD I HAVE AN A1C LEVEL?  · Every 3 months if your diabetes is not well controlled or if therapy has changed.  · Every 6 months if you are meeting your treatment goals.  HOW OFTEN SHOULD I DO SMBG TESTING?   Your caregiver will recommend how often you should test. Testing times are based on the kind of medicine you take, type of diabetes you have, and your blood glucose control. Testing times can include:  · Type 1 diabetes: test 3 or 4 times a day or as directed.  · Type 2 diabetes and if you are taking insulin and diabetes pills: test 3 or 4 times a day or as directed.  · If you are taking diabetes pills only and not reaching your target A1c: test 2 to 4 times a day or as directed.  · If you are taking diabetes pills and are controlling your diabetes well with diet and exercise, your  caregiver will help you decide what is appropriate.  WHAT TIME OF DAY SHOULD I TEST?   The best time of day to test your blood glucose depends on medications, mealtimes, exercise, and blood glucose control. It is best to test at different times because this will help you know how you are doing throughout the day. Your caregiver will help you decide what is best.  WHAT SHOULD MY BLOOD GLUCOSE BE?  Blood glucose target goals may vary depending on each persons needs, whether they have type 1 or type 2 diabetes or what medications they are taking. However, as a general rule, blood glucose should be:  · Before meals   70-130 mg/dl.  · After meals    ..less than 180 mg/dl.  CHECK YOUR BLOOD GLUCOSE IF:  · You have symptoms of low blood sugar (hypoglycemia), which may include dizziness, shaking, sweating, chills and confusion.  · You have symptoms of high blood sugar (hyperglycemia), which may include sleepiness, blurred vision, frequent urination and excessive thirst.  · You are learning how meals, physical activity and medicine affect your blood glucose level. The more you learn about how various foods, your medications, and activities affect you, the better job you will do of taking care of yourself.  ·   You have a job in which poor control could cause safety problems while driving or operating machinery.  CHECK YOUR BLOOD SUGAR MORE FREQUENTLY:  · If you have medication or dietary changes.  · If you begin taking other kinds of medicines.  · If you become sick or your level of stress increases. With an illness, your blood sugar may even be high without eating.  · Before and after exercise.  Follow your caregiver's testing recommendations during this time.   TO DISPOSE OF SHARPS:  Each city or state may have different regulations. Check with your public works or waste management department.  · Sharps containers can be purchased from pharmacies.  · Place all used sharps in a container. You do not need to replace any  protective covers over the needle or break the needle.  · Sharps should be contained in a ridge, leakproof, puncture-resistant container.  · Plastic detergent bottle.  · Bleach bottle.  · When container is almost full, add a solution that is 1 part laundry bleach and 9 parts tap water (it is okay to use undiluted bleach if you wish). You may want to wear gloves since bleach can damage tissue. Let the solution sit for 30 minutes.  · Carefully pour all the liquid into the sanitary sewer. Be sure to prevent the sharps from falling out.  · Once liquid is drained, reseal the container with lid and tape it shut with duct tape. This will prevent the cap from coming off.  · Dispose of the container with your regular household trash and waste. It is a good idea to let your trash hauler know that you will be disposing of sharps.  Document Released: 12/29/2002 Document Revised: 09/20/2011 Document Reviewed: 06/29/2008  [employer]® Patient Information ©2014 [employer].

## 2012-10-31 NOTE — Progress Notes (Signed)
Pt is here to establish care. Pt reports having diabetes and needing a PCP to help manage her blood sugar.

## 2012-10-31 NOTE — Progress Notes (Signed)
CC: establish PCP  HPI: 22 year old female with past medical history of diabetes who presented to clinic for reasons to establish primary care physician. Patient reports no current complaints. No chest pain or abdominal pain. No abnormal weight gain or weight loss. No heat or cold intolerance.  No Known Allergies Past Medical History  Diagnosis Date  . Acid reflux   . Gonorrhea June 2013  . Diabetes mellitus     metformin   No current outpatient prescriptions on file prior to visit.   No current facility-administered medications on file prior to visit.   Family History  Problem Relation Age of Onset  . Diabetes Mother   . Hypertension Father   . Heart disease Father   . Diabetes Maternal Aunt   . Hypertension Maternal Aunt   . Diabetes Maternal Uncle   . Hypertension Maternal Uncle   . Cancer Maternal Uncle   . Hypertension Paternal Aunt   . Hypertension Paternal Uncle    History   Social History  . Marital Status: Single    Spouse Name: N/A    Number of Children: N/A  . Years of Education: N/A   Occupational History  . Not on file.   Social History Main Topics  . Smoking status: Never Smoker   . Smokeless tobacco: Not on file  . Alcohol Use: No  . Drug Use: No  . Sexual Activity: Yes    Birth Control/ Protection: None   Other Topics Concern  . Not on file   Social History Narrative  . No narrative on file    Review of Systems  Constitutional: Negative for fever, chills, diaphoresis, activity change, appetite change and fatigue.  HENT: Negative for ear pain, nosebleeds, congestion, facial swelling, rhinorrhea, neck pain, neck stiffness and ear discharge.   Eyes: Negative for pain, discharge, redness, itching and visual disturbance.  Respiratory: Negative for cough, choking, chest tightness, shortness of breath, wheezing and stridor.   Cardiovascular: Negative for chest pain, palpitations and leg swelling.  Gastrointestinal: Negative for abdominal  distention.  Genitourinary: Negative for dysuria, urgency, frequency, hematuria, flank pain, decreased urine volume, difficulty urinating and dyspareunia.  Musculoskeletal: Negative for back pain, joint swelling, arthralgias and gait problem.  Neurological: Negative for dizziness, tremors, seizures, syncope, facial asymmetry, speech difficulty, weakness, light-headedness, numbness and headaches.  Hematological: Negative for adenopathy. Does not bruise/bleed easily.  Psychiatric/Behavioral: Negative for hallucinations, behavioral problems, confusion, dysphoric mood, decreased concentration and agitation.    Objective:   Filed Vitals:   10/31/12 1018  BP: 131/90  Pulse: 79  Temp: 98.6 F (37 C)  Resp: 16    Physical Exam  Constitutional: Appears well-developed and well-nourished. No distress.  HENT: Normocephalic. External right and left ear normal. Oropharynx is clear and moist.  Eyes: Conjunctivae and EOM are normal. PERRLA, no scleral icterus.  Neck: Normal ROM. Neck supple. No JVD. No tracheal deviation. No thyromegaly.  CVS: RRR, S1/S2 +, no murmurs, no gallops, no carotid bruit.  Pulmonary: Effort and breath sounds normal, no stridor, rhonchi, wheezes, rales.  Abdominal: Soft. BS +,  no distension, tenderness, rebound or guarding.  Musculoskeletal: Normal range of motion. No edema and no tenderness.  Lymphadenopathy: No lymphadenopathy noted, cervical, inguinal. Neuro: Alert. Normal reflexes, muscle tone coordination. No cranial nerve deficit. Skin: Skin is warm and dry. No rash noted. Not diaphoretic. No erythema. No pallor.  Psychiatric: Normal mood and affect. Behavior, judgment, thought content normal.   Lab Results  Component Value Date   WBC 9.2  05/16/2012   HGB 11.8* 05/16/2012   HCT 35.1* 05/16/2012   MCV 81.6 05/16/2012   PLT 327 05/16/2012   Lab Results  Component Value Date   CREATININE 0.60 04/24/2011   BUN 5* 04/24/2011   NA 138 04/24/2011   K 3.9 04/24/2011   CL 102  04/24/2011    No results found for this basename: HGBA1C   Lipid Panel  No results found for this basename: chol, trig, hdl, cholhdl, vldl, ldlcalc       Assessment and plan:   Patient Active Problem List   Diagnosis Date Noted  . Diabetes 10/31/2012    Priority: Medium - check A1c today - continue metformin  . Preventative health care 10/31/2012    Priority: Medium - check A1c, TSH and lipid panel - declined flu shot today

## 2013-01-31 ENCOUNTER — Ambulatory Visit: Payer: Medicaid Other

## 2013-02-01 ENCOUNTER — Encounter (HOSPITAL_COMMUNITY): Payer: Self-pay | Admitting: Emergency Medicine

## 2013-02-01 ENCOUNTER — Emergency Department (HOSPITAL_COMMUNITY)
Admission: EM | Admit: 2013-02-01 | Discharge: 2013-02-01 | Disposition: A | Discharge status: Home / Self Care | Payer: Medicaid Other | Attending: Emergency Medicine | Admitting: Emergency Medicine

## 2013-02-01 DIAGNOSIS — J029 Acute pharyngitis, unspecified: Secondary | ICD-10-CM | POA: Insufficient documentation

## 2013-02-01 DIAGNOSIS — Z8719 Personal history of other diseases of the digestive system: Secondary | ICD-10-CM | POA: Insufficient documentation

## 2013-02-01 DIAGNOSIS — Z8619 Personal history of other infectious and parasitic diseases: Secondary | ICD-10-CM | POA: Insufficient documentation

## 2013-02-01 DIAGNOSIS — J40 Bronchitis, not specified as acute or chronic: Secondary | ICD-10-CM

## 2013-02-01 DIAGNOSIS — J209 Acute bronchitis, unspecified: Secondary | ICD-10-CM | POA: Insufficient documentation

## 2013-02-01 DIAGNOSIS — E119 Type 2 diabetes mellitus without complications: Secondary | ICD-10-CM | POA: Insufficient documentation

## 2013-02-01 DIAGNOSIS — Z79899 Other long term (current) drug therapy: Secondary | ICD-10-CM | POA: Insufficient documentation

## 2013-02-01 MED ORDER — AZITHROMYCIN 250 MG PO TABS: 250.0000 mg | ORAL_TABLET | Freq: Every day | ORAL | Status: DC

## 2013-02-01 MED ORDER — AZITHROMYCIN 250 MG PO TABS
500.0000 mg | ORAL_TABLET | Freq: Once | ORAL | Status: AC
  Administered 2013-02-01: 500 mg via ORAL
  Filled 2013-02-01: qty 2

## 2013-02-01 MED ORDER — ALBUTEROL SULFATE HFA 108 (90 BASE) MCG/ACT IN AERS
2.0000 | INHALATION_SPRAY | RESPIRATORY_TRACT | Status: DC | PRN
  Administered 2013-02-01: 2 via RESPIRATORY_TRACT
  Filled 2013-02-01: qty 6.7

## 2013-02-01 MED ORDER — GUAIFENESIN ER 600 MG PO TB12: 1200.0000 mg | ORAL_TABLET | Freq: Two times a day (BID) | ORAL | Status: DC

## 2013-02-01 NOTE — ED Provider Notes (Signed)
CSN: [id number]     Arrival date & time 02/01/13  3762 History   First MD Initiated Contact with Patient 02/01/13 [phone]     Chief Complaint  Patient presents with  . Shortness of Breath  . Cough  . Sore Throat   HPI  History provided by the patient and friend. Patient is a 23 year old female with history of diabetes who presents with complaints of increased fatigue, body aches, cough and sore throat. Symptoms first began 2 days ago and have been persistent. This evening and early this morning, cough seemed much worse. Cough is dry nonproductive. No hemoptysis. Patient has not been using any medications for her symptoms. She denies any history of smoking. Does report past history of bronchitis. Denies any known sick contacts. No recent travel.    Past Medical History  Diagnosis Date  . Acid reflux   . Gonorrhea June 2013  . Diabetes mellitus     metformin   Past Surgical History  Procedure Laterality Date  . No past surgeries     Family History  Problem Relation Age of Onset  . Diabetes Mother   . Hypertension Father   . Heart disease Father   . Diabetes Maternal Aunt   . Hypertension Maternal Aunt   . Diabetes Maternal Uncle   . Hypertension Maternal Uncle   . Cancer Maternal Uncle   . Hypertension Paternal Aunt   . Hypertension Paternal Uncle    History  Substance Use Topics  . Smoking status: Never Smoker   . Smokeless tobacco: Not on file  . Alcohol Use: No   OB History   Grav Para Term Preterm Abortions TAB SAB Ect Mult Living   1 1       1       Review of Systems  Constitutional: Positive for fever, chills and fatigue.  HENT: Positive for congestion, rhinorrhea and sore throat.   Respiratory: Positive for cough.   Gastrointestinal: Negative for nausea, vomiting, diarrhea and constipation.  Musculoskeletal: Positive for myalgias.  All other systems reviewed and are negative.    Allergies  Review of patient's allergies indicates no known allergies.  Home  Medications   Current Outpatient Rx  Name  Route  Sig  Dispense  Refill  . dextromethorphan (DELSYM) 30 MG/5ML liquid   Oral   Take 15 mg by mouth as needed for cough.         . DM-Doxylamine-Acetaminophen 15-6.25-325 MG/15ML LIQD   Oral   Take 15 mLs by mouth at bedtime as needed (cold).         . metFORMIN (GLUCOPHAGE) 500 MG tablet   Oral   Take 1 tablet (500 mg total) by mouth 2 (two) times daily with a meal.   60 tablet   3    BP 128/94  Pulse 85  Temp(Src) 98.2 F (36.8 C) (Oral)  Resp 20  Wt 220 lb (99.791 kg)  SpO2 98%  LMP 01/22/2013 Physical Exam  Nursing note and vitals reviewed. Constitutional: She is oriented to person, place, and time. She appears well-developed and well-nourished. No distress.  HENT:  Head: Normocephalic.  Right Ear: External ear normal.  Left Ear: External ear normal.  Mouth/Throat: Oropharynx is clear and moist.  Tonsils mildly enlarged. no erythema or exudate. Uvula midline.  Eyes: Conjunctivae are normal.  Neck: Normal range of motion. Neck supple.  Cardiovascular: Normal rate and regular rhythm.   Pulmonary/Chest: Effort normal. No respiratory distress. She has wheezes. She has no rales.  Abdominal: Soft. There is no tenderness. There is no rebound.  Lymphadenopathy:    She has no cervical adenopathy.  Neurological: She is alert and oriented to person, place, and time.  Skin: Skin is warm and dry. No rash noted.  Psychiatric: She has a normal mood and affect. Her behavior is normal.    ED Course  Procedures   DIAGNOSTIC STUDIES: Oxygen Saturation is 98% on RA.    COORDINATION OF CARE:  Nursing notes reviewed. Vital signs reviewed. Initial pt interview and examination performed.   5:42 AM-patient seen and evaluated. If she appears well no acute distress. She has mild expiratory wheezes. No crackles, rales or rhonchi.       MDM   1. [place], PA-C 02/01/13 [phone]

## 2013-02-01 NOTE — Discharge Instructions (Signed)
Please use the albuterol inhaler by taking 2 puffs every 4 hours as needed for cough and wheezing. Followup with a primary care provider for continued evaluation and treatment. Drink plenty of water to stay hydrated.    Bronchitis Bronchitis is inflammation of the airways that extend from the windpipe into the lungs (bronchi). The inflammation often causes mucus to develop, which leads to a cough. If the inflammation becomes severe, it may cause shortness of breath. CAUSES  Bronchitis may be caused by:   Viral infections.   Bacteria.   Cigarette smoke.   Allergens, pollutants, and other irritants.  SIGNS AND SYMPTOMS  The most common symptom of bronchitis is a frequent cough that produces mucus. Other symptoms include:  Fever.   Body aches.   Chest congestion.   Chills.   Shortness of breath.   Sore throat.  DIAGNOSIS  Bronchitis is usually diagnosed through a medical history and physical exam. Tests, such as chest X-rays, are sometimes done to rule out other conditions.  TREATMENT  You may need to avoid contact with whatever caused the problem (smoking, for example). Medicines are sometimes needed. These may include:  Antibiotics. These may be prescribed if the condition is caused by bacteria.  Cough suppressants. These may be prescribed for relief of cough symptoms.   Inhaled medicines. These may be prescribed to help open your airways and make it easier for you to breathe.   Steroid medicines. These may be prescribed for those with recurrent (chronic) bronchitis. HOME CARE INSTRUCTIONS  Get plenty of rest.   Drink enough fluids to keep your urine clear or pale yellow (unless you have a medical condition that requires fluid restriction). Increasing fluids may help thin your secretions and will prevent dehydration.   Only take over-the-counter or prescription medicines as directed by your health care provider.  Only take antibiotics as directed. Make  sure you finish them even if you start to feel better.  Avoid secondhand smoke, irritating chemicals, and strong fumes. These will make bronchitis worse. If you are a smoker, quit smoking. Consider using nicotine gum or skin patches to help control withdrawal symptoms. Quitting smoking will help your lungs heal faster.   Put a cool-mist humidifier in your bedroom at night to moisten the air. This may help loosen mucus. Change the water in the humidifier daily. You can also run the hot water in your shower and sit in the bathroom with the door closed for 5 10 minutes.   Follow up with your health care provider as directed.   Wash your hands frequently to avoid catching bronchitis again or spreading an infection to others.  SEEK MEDICAL CARE IF: Your symptoms do not improve after 1 week of treatment.  SEEK IMMEDIATE MEDICAL CARE IF:  Your fever increases.  You have chills.   You have chest pain.   You have worsening shortness of breath.   You have bloody sputum.  You faint.  You have lightheadedness.  You have a severe headache.   You vomit repeatedly. MAKE SURE YOU:   Understand these instructions.  Will watch your condition.  Will get help right away if you are not doing well or get worse. Document Released: 12/26/2004 Document Revised: 10/16/2012 Document Reviewed: 08/20/2012 [facility] Patient Information 2014 [place].

## 2013-02-02 NOTE — ED Provider Notes (Signed)
Medical screening examination/treatment/procedure(s) were performed by non-physician practitioner and as supervising physician I was immediately available for consultation/collaboration.    Kalman Drape, MD 02/02/13 2125

## 2013-03-11 ENCOUNTER — Encounter (HOSPITAL_COMMUNITY): Payer: Self-pay | Admitting: Emergency Medicine

## 2013-03-11 ENCOUNTER — Emergency Department (HOSPITAL_COMMUNITY)
Admission: EM | Admit: 2013-03-11 | Discharge: 2013-03-11 | Disposition: A | Discharge status: Home / Self Care | Payer: Medicaid Other | Attending: Emergency Medicine | Admitting: Emergency Medicine

## 2013-03-11 DIAGNOSIS — Z8619 Personal history of other infectious and parasitic diseases: Secondary | ICD-10-CM | POA: Insufficient documentation

## 2013-03-11 DIAGNOSIS — Z3202 Encounter for pregnancy test, result negative: Secondary | ICD-10-CM | POA: Insufficient documentation

## 2013-03-11 DIAGNOSIS — R109 Unspecified abdominal pain: Secondary | ICD-10-CM

## 2013-03-11 DIAGNOSIS — Z79899 Other long term (current) drug therapy: Secondary | ICD-10-CM | POA: Insufficient documentation

## 2013-03-11 DIAGNOSIS — R209 Unspecified disturbances of skin sensation: Secondary | ICD-10-CM | POA: Insufficient documentation

## 2013-03-11 DIAGNOSIS — F419 Anxiety disorder, unspecified: Secondary | ICD-10-CM

## 2013-03-11 DIAGNOSIS — R42 Dizziness and giddiness: Secondary | ICD-10-CM | POA: Insufficient documentation

## 2013-03-11 DIAGNOSIS — Z8719 Personal history of other diseases of the digestive system: Secondary | ICD-10-CM | POA: Insufficient documentation

## 2013-03-11 DIAGNOSIS — R1033 Periumbilical pain: Secondary | ICD-10-CM | POA: Insufficient documentation

## 2013-03-11 DIAGNOSIS — E119 Type 2 diabetes mellitus without complications: Secondary | ICD-10-CM | POA: Insufficient documentation

## 2013-03-11 DIAGNOSIS — F411 Generalized anxiety disorder: Secondary | ICD-10-CM | POA: Insufficient documentation

## 2013-03-11 DIAGNOSIS — K429 Umbilical hernia without obstruction or gangrene: Secondary | ICD-10-CM | POA: Insufficient documentation

## 2013-03-11 LAB — CBC WITH DIFFERENTIAL/PLATELET
BASOS ABS: 0 10*3/uL (ref 0.0–0.1)
BASOS PCT: 0 % (ref 0–1)
EOS ABS: 0.1 10*3/uL (ref 0.0–0.7)
Eosinophils Relative: 1 % (ref 0–5)
HCT: 35.7 % — ABNORMAL LOW (ref 36.0–46.0)
Hemoglobin: 11.2 g/dL — ABNORMAL LOW (ref 12.0–15.0)
Lymphocytes Relative: 40 % (ref 12–46)
Lymphs Abs: 2.7 10*3/uL (ref 0.7–4.0)
MCH: 24.6 pg — AB (ref 26.0–34.0)
MCHC: 31.4 g/dL (ref 30.0–36.0)
MCV: 78.3 fL (ref 78.0–100.0)
Monocytes Absolute: 0.4 10*3/uL (ref 0.1–1.0)
Monocytes Relative: 5 % (ref 3–12)
NEUTROS ABS: 3.6 10*3/uL (ref 1.7–7.7)
Neutrophils Relative %: 53 % (ref 43–77)
PLATELETS: 433 10*3/uL — AB (ref 150–400)
RBC: 4.56 MIL/uL (ref 3.87–5.11)
RDW: 14.8 % (ref 11.5–15.5)
WBC: 6.8 10*3/uL (ref 4.0–10.5)

## 2013-03-11 LAB — URINE MICROSCOPIC-ADD ON

## 2013-03-11 LAB — BASIC METABOLIC PANEL
BUN: 7 mg/dL (ref 6–23)
CHLORIDE: 102 meq/L (ref 96–112)
CO2: 22 mEq/L (ref 19–32)
Calcium: 9.6 mg/dL (ref 8.4–10.5)
Creatinine, Ser: 0.56 mg/dL (ref 0.50–1.10)
GFR calc Af Amer: >90 mL/min (ref >90)
Glucose, Bld: 79 mg/dL (ref 70–99)
POTASSIUM: 4.3 meq/L (ref 3.7–5.3)
Sodium: 137 mEq/L (ref 137–147)

## 2013-03-11 LAB — URINALYSIS, ROUTINE W REFLEX MICROSCOPIC
BILIRUBIN URINE: NEGATIVE
Glucose, UA: NEGATIVE mg/dL
HGB URINE DIPSTICK: NEGATIVE
Ketones, ur: NEGATIVE mg/dL
NITRITE: NEGATIVE
Protein, ur: NEGATIVE mg/dL
SPECIFIC GRAVITY, URINE: 1.029 (ref 1.005–1.030)
UROBILINOGEN UA: 1 mg/dL (ref 0.0–1.0)
pH: 6 (ref 5.0–8.0)

## 2013-03-11 LAB — POC URINE PREG, ED: PREG TEST UR: NEGATIVE

## 2013-03-11 LAB — I-STAT TROPONIN, ED: TROPONIN I, POC: 0.01 ng/mL (ref 0.00–0.08)

## 2013-03-11 MED ORDER — KETOROLAC TROMETHAMINE 30 MG/ML IJ SOLN
30.0000 mg | Freq: Once | INTRAMUSCULAR | Status: DC
  Filled 2013-03-11: qty 1

## 2013-03-11 MED ORDER — TRAMADOL HCL 50 MG PO TABS: 50.0000 mg | ORAL_TABLET | Freq: Four times a day (QID) | ORAL | Status: DC | PRN

## 2013-03-11 MED ORDER — KETOROLAC TROMETHAMINE 60 MG/2ML IM SOLN
60.0000 mg | Freq: Once | INTRAMUSCULAR | Status: AC
  Administered 2013-03-11: 60 mg via INTRAMUSCULAR
  Filled 2013-03-11: qty 2

## 2013-03-11 NOTE — ED Notes (Signed)
Initial Contact - pt resting on stretcher, reports "belly button pain" 2/10 at this time, onset while at work.  Pt denies other complaints.  Pt is well appearing.  Reports eating/drinking at baseline.  Denies fevers/chills.  Speaking full/clear sentences, rr even/un-lab.  Skin PWD.  NAD.

## 2013-03-11 NOTE — ED Notes (Signed)
Pt from work with c/o L sided abd pain, rad into L flank, 10/10, denies other complaints.  Skin PWD.

## 2013-03-11 NOTE — ED Notes (Signed)
Bed: WA05 Expected date:  Expected time:  Means of arrival:  Comments: EMS-abdominal pain 

## 2013-03-11 NOTE — ED Notes (Signed)
Pt presents to ED with c/o chest pain.  Pt was seen here earlier for abdominal hernia and was discharged home.  Was on her way home in a car when she suddenly had chest pains.

## 2013-03-11 NOTE — ED Provider Notes (Signed)
Pt seen and evaluated.  TTP at umbilicus/abdominal wall.  No incarcerated hernia.  Defect palpable. Plan:  Dc, surgical followup.  Tanna Furry, MD 03/11/13 2133

## 2013-03-11 NOTE — ED Provider Notes (Addendum)
CSN: [id number]     Arrival date & time 03/11/13  2014 History   First MD Initiated Contact with Patient 03/11/13 2116     Chief Complaint  Patient presents with  . Chest Pain      HPI  Patient seen and discharged less than hour ago for tenderness in her periumbilical abdomen consistent with a periumbilical hernia. Is not acutely hernia incarcerated. She was sleeping. Her distal foot "funny". Lightheaded, feelsanxious. She presents emergent. She's asymptomatic on arrival here. Has a normal EKG.  Past Medical History  Diagnosis Date  . Acid reflux   . Gonorrhea June 2013  . Diabetes mellitus     metformin   Past Surgical History  Procedure Laterality Date  . No past surgeries     Family History  Problem Relation Age of Onset  . Diabetes Mother   . Hypertension Father   . Heart disease Father   . Diabetes Maternal Aunt   . Hypertension Maternal Aunt   . Diabetes Maternal Uncle   . Hypertension Maternal Uncle   . Cancer Maternal Uncle   . Hypertension Paternal Aunt   . Hypertension Paternal Uncle    History  Substance Use Topics  . Smoking status: Never Smoker   . Smokeless tobacco: Not on file  . Alcohol Use: No   OB History   Grav Para Term Preterm Abortions TAB SAB Ect Mult Living   1 1       1       Review of Systems  Constitutional: Negative for fever, chills, diaphoresis, appetite change and fatigue.  HENT: Negative for mouth sores, sore throat and trouble swallowing.   Eyes: Negative for visual disturbance.  Respiratory: Positive for chest tightness. Negative for cough, shortness of breath and wheezing.   Cardiovascular: Negative for chest pain.  Gastrointestinal: Positive for abdominal pain. Negative for nausea, vomiting, diarrhea and abdominal distention.  Endocrine: Negative for polydipsia, polyphagia and polyuria.  Genitourinary: Negative for dysuria, frequency and hematuria.  Musculoskeletal: Negative for gait problem.  Skin: Negative for color change,  pallor and rash.  Neurological: Negative for dizziness, syncope, light-headedness and headaches.  Hematological: Does not bruise/bleed easily.  Psychiatric/Behavioral: Negative for behavioral problems and confusion. The patient is nervous/anxious.       Allergies  Review of patient's allergies indicates no known allergies.  Home Medications   Current Outpatient Rx  Name  Route  Sig  Dispense  Refill  . metFORMIN (GLUCOPHAGE) 500 MG tablet   Oral   Take 1 tablet (500 mg total) by mouth 2 (two) times daily with a meal.   60 tablet   3   . traMADol (ULTRAM) 50 MG tablet   Oral   Take 1 tablet (50 mg total) by mouth every 6 (six) hours as needed for moderate pain.   10 tablet   0    BP 129/90  Pulse 86  Temp(Src) 97.8 F (36.6 C) (Oral)  Resp 18  Ht 5\' 6"  (1.676 m)  Wt 200 lb (90.719 kg)  BMI 32.30 kg/m2  SpO2 99% Physical Exam  Constitutional: She is oriented to person, place, and time. She appears well-developed and well-nourished. No distress.  HENT:  Head: Normocephalic.  Eyes: Conjunctivae are normal. Pupils are equal, round, and reactive to light. No scleral icterus.  Neck: Normal range of motion. Neck supple. No thyromegaly present.  Cardiovascular: Normal rate and regular rhythm.  Exam reveals no gallop and no friction rub.   No murmur heard. Clear lungs.  Regular heart. No subcutaneous air in the chest. No pain or tenderness on exam.  Pulmonary/Chest: Effort normal and breath sounds normal. No respiratory distress. She has no decreased breath sounds. She has no wheezes. She has no rhonchi. She has no rales.  Abdominal: Soft. Bowel sounds are normal. She exhibits no distension. There is no tenderness. There is no rebound.    Musculoskeletal: Normal range of motion.  Neurological: She is alert and oriented to person, place, and time.  Skin: Skin is warm and dry. No rash noted.  Psychiatric: She has a normal mood and affect. Her behavior is normal.    ED  Course  Procedures (including critical care time) [place], ED   Imaging Review No results found.   EKG Interpretation None      MDM   Final diagnoses:  Anxiety    Patient well oxygenated. Not tachycardic. Not respiratory. Nonsmoker. Normal EKG. Clear anxiety reaction. She is perfectly appropriate for discharge.    Tanna Furry, MD 03/11/13 2136  Tanna Furry, MD 03/13/13 [phone]

## 2013-03-11 NOTE — ED Provider Notes (Signed)
CSN: [id number]     Arrival date & time 03/11/13  1813 History   First MD Initiated Contact with Patient 03/11/13 1827     Chief Complaint  Patient presents with  . Abdominal Pain     (Consider location/radiation/quality/duration/timing/severity/associated sxs/prior Treatment) HPI Pt is a 23yo female c/o centralized abdominal pain that started earlier this afternoon while at work.  Pain is 10/10 at worst, located in [employer] region. Pain first started when pt bent down. States when she is at rest, pain and discomfort is still present, 2/10.  Denies fever, n/v/d. Reports eating and drinking normally. No change in bowel or bladder habits, reports 2 BMs earlier today.  No hx of abdominal surgeries but does report having a "bigger belly button" when she was little.   Past Medical History  Diagnosis Date  . Acid reflux   . Gonorrhea June 2013  . Diabetes mellitus     metformin   Past Surgical History  Procedure Laterality Date  . No past surgeries     Family History  Problem Relation Age of Onset  . Diabetes Mother   . Hypertension Father   . Heart disease Father   . Diabetes Maternal Aunt   . Hypertension Maternal Aunt   . Diabetes Maternal Uncle   . Hypertension Maternal Uncle   . Cancer Maternal Uncle   . Hypertension Paternal Aunt   . Hypertension Paternal Uncle    History  Substance Use Topics  . Smoking status: Never Smoker   . Smokeless tobacco: Not on file  . Alcohol Use: No   OB History   Grav Para Term Preterm Abortions TAB SAB Ect Mult Living   1 1       1       Review of Systems  Constitutional: Negative for fever and chills.  Respiratory: Negative for shortness of breath.   Cardiovascular: Negative for chest pain.  Gastrointestinal: Positive for abdominal pain. Negative for nausea, vomiting, diarrhea and abdominal distention.  Genitourinary: Negative for dysuria and flank pain.  All other systems reviewed and are negative.      Allergies  Review of  patient's allergies indicates no known allergies.  Home Medications   Current Outpatient Rx  Name  Route  Sig  Dispense  Refill  . metFORMIN (GLUCOPHAGE) 500 MG tablet   Oral   Take 1 tablet (500 mg total) by mouth 2 (two) times daily with a meal.   60 tablet   3   . traMADol (ULTRAM) 50 MG tablet   Oral   Take 1 tablet (50 mg total) by mouth every 6 (six) hours as needed for moderate pain.   10 tablet   0    BP 133/71  Pulse 87  Temp(Src) 98.4 F (36.9 C) (Oral)  Resp 16  SpO2 99% Physical Exam  Nursing note and vitals reviewed. Constitutional: She appears well-developed and well-nourished. No distress.  HENT:  Head: Normocephalic and atraumatic.  Eyes: Conjunctivae are normal. No scleral icterus.  Neck: Normal range of motion.  Cardiovascular: Normal rate, regular rhythm and normal heart sounds.   Pulmonary/Chest: Effort normal and breath sounds normal. No respiratory distress. She has no wheezes. She has no rales. She exhibits no tenderness.  Abdominal: Soft. Bowel sounds are normal. She exhibits mass. She exhibits no distension. There is tenderness. There is no rebound and no guarding.  Obese abdomen, soft, tenderness over umbilicus, reducible hernia. No evidence of incarceration or underlying infection  Musculoskeletal: Normal range of motion.  Neurological: She is alert.  Skin: Skin is warm and dry. She is not diaphoretic.    ED Course  Procedures (including critical care time) Labs Review Labs Reviewed  CBC WITH DIFFERENTIAL - Abnormal; Notable for the following:    Hemoglobin 11.2 (*)    HCT 35.7 (*)    MCH 24.6 (*)    Platelets 433 (*)    All other components within normal limits  URINALYSIS, ROUTINE W REFLEX MICROSCOPIC - Abnormal; Notable for the following:    APPearance CLOUDY (*)    Leukocytes, UA SMALL (*)    All other components within normal limits  BASIC METABOLIC PANEL  URINE MICROSCOPIC-ADD ON  POC URINE PREG, ED   Imaging Review No  results found.   EKG Interpretation None      MDM   Final diagnoses:  Umbilical hernia  Abdominal pain    Pt presenting with abdominal pain. Reducible hernia on exam. Discussed pt with Dr. Jeneen Rinks who also examined pt. No CT needed at this time. Not concerned for incarceration. Discussed strict return precautions. Very low suspicion for early appendicitis.  Pt is afebrile, denies n/v/d. Pt appears well, laughing in exam room with friend. Pain 2/10 in ED.  No focal RLQ tenderness or tenderness in [place]bur[place]'s point. Will discharge home with tramadol as needed for pain.  Information for [facility] surgery provided. Advised to f/u with PCP. Return precautions provided. Pt verbalized understanding and agreement with tx plan.     Noland Fordyce, PA-C 03/12/13 1506

## 2013-03-14 ENCOUNTER — Ambulatory Visit: Payer: Medicaid Other | Attending: Internal Medicine | Admitting: Internal Medicine

## 2013-03-14 VITALS — BP 129/95 | HR 91 | Temp 98.4°F | Resp 16 | Ht 66.0 in | Wt 222.0 lb

## 2013-03-14 DIAGNOSIS — E119 Type 2 diabetes mellitus without complications: Secondary | ICD-10-CM

## 2013-03-14 DIAGNOSIS — R1900 Intra-abdominal and pelvic swelling, mass and lump, unspecified site: Secondary | ICD-10-CM

## 2013-03-14 DIAGNOSIS — O0990 Supervision of high risk pregnancy, unspecified, unspecified trimester: Secondary | ICD-10-CM

## 2013-03-14 LAB — GLUCOSE, POCT (MANUAL RESULT ENTRY): POC Glucose: 150 mg/dl — AB (ref 70–99)

## 2013-03-14 LAB — POCT GLYCOSYLATED HEMOGLOBIN (HGB A1C): HEMOGLOBIN A1C: 6.9

## 2013-03-14 MED ORDER — GLIMEPIRIDE 2 MG PO TABS: 2.0000 mg | ORAL_TABLET | Freq: Every day | ORAL | Status: DC

## 2013-03-14 MED ORDER — METFORMIN HCL 500 MG PO TABS: 500.0000 mg | ORAL_TABLET | Freq: Two times a day (BID) | ORAL | Status: DC

## 2013-03-14 NOTE — Patient Instructions (Signed)
You need to lose weight try to lose 5 lbs before your next visit

## 2013-03-14 NOTE — Progress Notes (Unsigned)
Pt is here following up on her diabetes and her periumbilical hernia. Pt is needing her medications refilled.

## 2013-03-14 NOTE — Progress Notes (Unsigned)
Patient ID: Julia Willis, female   DOB: 09-Nov-1990, 23 y.o.   MRN: [id number]   HPI: Julia Willis is a 23 y.o. female presenting on 03/14/2013 with masses in her abdomen. Not painful unless palpated. Noted a week or two ago.     Past Medical History  Diagnosis Date  . Acid reflux   . Gonorrhea June 2013  . Diabetes mellitus     metformin    Past Surgical History  Procedure Laterality Date  . No past surgeries      Current Outpatient Prescriptions  Medication Sig Dispense Refill  . metFORMIN (GLUCOPHAGE) 500 MG tablet Take 1 tablet (500 mg total) by mouth 2 (two) times daily with a meal.  60 tablet  3  . traMADol (ULTRAM) 50 MG tablet Take 1 tablet (50 mg total) by mouth every 6 (six) hours as needed for moderate pain.  10 tablet  0   No current facility-administered medications for this visit.    No Known Allergies  Family History  Problem Relation Age of Onset  . Diabetes Mother   . Hypertension Father   . Heart disease Father   . Diabetes Maternal Aunt   . Hypertension Maternal Aunt   . Diabetes Maternal Uncle   . Hypertension Maternal Uncle   . Cancer Maternal Uncle   . Hypertension Paternal Aunt   . Hypertension Paternal Uncle     History   Social History  . Marital Status: Single    Spouse Name: N/A    Number of Children: N/A  . Years of Education: N/A   Occupational History  . Not on file.   Social History Main Topics  . Smoking status: Never Smoker   . Smokeless tobacco: Not on file  . Alcohol Use: No  . Drug Use: No  . Sexual Activity: Yes    Birth Control/ Protection: None   Other Topics Concern  . Not on file   Social History Narrative  . No narrative on file    Review of Systems  Review of Systems  Constitutional: Negative for fever, chills, diaphoresis, activity change, appetite change and fatigue.  HENT: Negative for ear pain, nosebleeds, congestion, facial swelling, rhinorrhea, neck pain, neck stiffness and ear discharge.  Eyes:  Negative for pain, discharge, redness, itching and visual disturbance.  Respiratory: Negative for cough, choking, chest tightness, shortness of breath, wheezing and stridor.  Cardiovascular: Negative for chest pain, palpitations and leg swelling.  Gastrointestinal: Negative for abdominal distention, vomiting, diarrhea or consitpation- multiple small tender masses in anterior abdominal wall Genitourinary: Negative for dysuria, urgency, frequency, hematuria, flank pain, decreased urine volume, difficulty urinating and dyspareunia.  Musculoskeletal: Negative for back pain, joint swelling, arthralgias or gait problem.  Neurological: Negative for dizziness, tremors, seizures, syncope, facial asymmetry, speech difficulty, weakness, light-headedness, numbness and headaches.  Hematological: Negative for adenopathy. Does not bruise/bleed easily.  Psychiatric/Behavioral: Negative for hallucinations, behavioral problems, confusion, dysphoric mood   Objective:  BP 129/95  Pulse 91  Temp(Src) 98.4 F (36.9 C) (Oral)  Resp 16  Ht 5\' 6"  (1.676 m)  Wt 222 lb (100.699 kg)  BMI 35.85 kg/m2  SpO2 97%  LMP 02/17/2013 Filed Weights   03/14/13 0926  Weight: 222 lb (100.699 kg)     Physical Exam  Constitutional: Appears well-developed and well-nourished. No distress. HENT: Normocephalic. External right and left ear normal. Oropharynx is clear and moist.  Eyes: Conjunctivae and EOM are normal. PERRLA, no scleral icterus.  Neck: Normal ROM. Neck supple. No JVD.  No tracheal deviation. No thyromegaly.  CVS: RRR, S1/S2 +, no murmurs, no gallops, no carotid bruit.  Pulmonary: Effort and breath sounds normal, no stridor, rhonchi, wheezes, rales.  Abdominal: Soft. BS +,  no distension, tenderness, rebound or guarding.  Musculoskeletal: Normal range of motion. No edema and no tenderness.  Neuro: Alert. Normal reflexes, muscle tone coordination. No cranial nerve deficit. Skin: Skin is warm and dry. No rash  noted. Not diaphoretic. No erythema. No pallor.  Psychiatric: Normal mood and affect. Behavior, judgment, thought content normal.   Lab Results  Component Value Date   WBC 6.8 03/11/2013   HGB 11.2* 03/11/2013   HCT 35.7* 03/11/2013   MCV 78.3 03/11/2013   PLT 433* 03/11/2013   Lab Results  Component Value Date   CREATININE 0.56 03/11/2013   BUN 7 03/11/2013   NA 137 03/11/2013   K 4.3 03/11/2013   CL 102 03/11/2013   CO2 22 03/11/2013    Lab Results  Component Value Date   HGBA1C 6.9 03/14/2013   Lipid Panel     Component Value Date/Time   CHOL 193 10/31/2012 1045   TRIG 136 10/31/2012 1045   HDL 43 10/31/2012 1045   CHOLHDL 4.5 10/31/2012 1045   VLDL 27 10/31/2012 1045   LDLCALC 123* 10/31/2012 1045        Patient Active Problem List   Diagnosis Date Noted  . Diabetes 10/31/2012  . Preventative health care 10/31/2012  . High-risk pregnancy supervision 05/13/2012     Preventative Medicine:  Health Maintenance  Topic Date Due  . Tetanus/tdap  05/30/2008  . Influenza Vaccine  08/09/2012  . Pap Smear  05/14/2015    Adult vaccines due  Topic Date Due  . Tetanus/tdap  05/30/2008       Assessment and plan: Diabetes - Plan: Glucose (CBG), HgB A1c- 6.9 Add Amaryl- needs weight loss and diet control  Abdominal masses - CT abd  High-risk pregnancy supervision   No Follow-up on file.   The patient was given clear instructions to go to ER or return to medical center if symptoms don't improve, worsen or new problems develop. The patient verbalized understanding. The patient was told to call to get lab results if they haven't heard anything in the next week.     Debbe Odea, MD

## 2013-03-18 ENCOUNTER — Ambulatory Visit (HOSPITAL_COMMUNITY)
Admission: RE | Admit: 2013-03-18 | Discharge: 2013-03-18 | Disposition: A | Discharge status: Home / Self Care | Payer: Medicaid Other | Source: Ambulatory Visit | Attending: Internal Medicine | Admitting: Internal Medicine

## 2013-03-18 DIAGNOSIS — R1909 Other intra-abdominal and pelvic swelling, mass and lump: Secondary | ICD-10-CM | POA: Insufficient documentation

## 2013-03-18 DIAGNOSIS — R1900 Intra-abdominal and pelvic swelling, mass and lump, unspecified site: Secondary | ICD-10-CM

## 2013-03-18 MED ORDER — IOHEXOL 300 MG/ML  SOLN
80.0000 mL | Freq: Once | INTRAMUSCULAR | Status: AC | PRN
Start: 2013-03-18 — End: 2013-03-18
  Administered 2013-03-18: 80 mL via INTRAVENOUS

## 2013-03-19 NOTE — ED Provider Notes (Signed)
Medical screening examination/treatment/procedure(s) were performed by non-physician practitioner and as supervising physician I was immediately available for consultation/collaboration.   EKG Interpretation None        Tanna Furry, MD 03/19/13 [phone]

## 2013-03-21 ENCOUNTER — Telehealth: Payer: Self-pay | Admitting: Internal Medicine

## 2013-03-21 NOTE — Telephone Encounter (Signed)
Pt had CT scan done on 03/18/13 and is calling about results.

## 2013-03-25 NOTE — Telephone Encounter (Signed)
Pt calling back about results, she has not received call back yet, had imaging done on 03/18/13.

## 2013-03-26 ENCOUNTER — Other Ambulatory Visit: Payer: Self-pay

## 2013-03-26 ENCOUNTER — Telehealth: Payer: Self-pay

## 2013-03-26 ENCOUNTER — Telehealth: Payer: Self-pay | Admitting: Internal Medicine

## 2013-03-26 DIAGNOSIS — R9389 Abnormal findings on diagnostic imaging of other specified body structures: Secondary | ICD-10-CM

## 2013-03-26 NOTE — Telephone Encounter (Signed)
Returned patient phone call She is aware of her CT of the abdomen Needs referral to gynecology -patient is aware Scheduled a transvaginal US tues 3/24 at 1pm at [facility] [place] Patient is aware of her appointment

## 2013-03-26 NOTE — Telephone Encounter (Signed)
Pt called regarding the results of her tests, please contact pt as soon as possible

## 2013-04-01 ENCOUNTER — Ambulatory Visit (HOSPITAL_COMMUNITY)
Admission: RE | Admit: 2013-04-01 | Discharge: 2013-04-01 | Disposition: A | Discharge status: Home / Self Care | Payer: Self-pay | Source: Ambulatory Visit | Attending: Internal Medicine | Admitting: Internal Medicine

## 2013-04-01 DIAGNOSIS — R9389 Abnormal findings on diagnostic imaging of other specified body structures: Secondary | ICD-10-CM | POA: Insufficient documentation

## 2013-04-04 ENCOUNTER — Telehealth: Payer: Self-pay | Admitting: Emergency Medicine

## 2013-04-04 NOTE — Telephone Encounter (Signed)
Left VM for pt to call when message recieved

## 2013-04-04 NOTE — Telephone Encounter (Signed)
Message copied by Ricci Barker on Fri Apr 04, 2013  5:51 PM ------      Message from: Allyson Sabal MD, Ascencion Dike      Created: Wed Apr 02, 2013 10:32 AM       Notify patient that the transvaginal ultrasound shows nabothian cysts which are  A benign finding. Ovaries looked normal. Endometrial thickness is normal       ------

## 2013-04-10 ENCOUNTER — Telehealth: Payer: Self-pay | Admitting: Emergency Medicine

## 2013-04-10 NOTE — Telephone Encounter (Signed)
Pt calling about results, please call before 4pm.  Pt states she has been calling for 2.5 weeks for results and has not been able to get in touch with anybody.

## 2013-04-10 NOTE — Telephone Encounter (Signed)
Pt given CT results.

## 2013-04-14 ENCOUNTER — Ambulatory Visit: Payer: Medicaid Other | Admitting: Internal Medicine

## 2013-04-24 ENCOUNTER — Ambulatory Visit: Payer: Self-pay | Admitting: Pharmacist

## 2013-06-11 ENCOUNTER — Ambulatory Visit (INDEPENDENT_AMBULATORY_CARE_PROVIDER_SITE_OTHER): Payer: Self-pay | Admitting: Obstetrics and Gynecology

## 2013-06-11 ENCOUNTER — Encounter: Payer: Self-pay | Admitting: Obstetrics and Gynecology

## 2013-06-11 NOTE — Progress Notes (Signed)
Patient ID: Julia Willis, female   DOB: 1990/10/11, 23 y.o.   MRN: [id number] Patient is not sure why she is here today. She is not in pain and is in need of a PCP. Patient does not want annual exam today. Will void this visit as there is no chief complaint. Number to family practice clinic provided for routine care and management of her diabetes

## 2013-06-18 ENCOUNTER — Encounter: Payer: Self-pay | Admitting: Obstetrics and Gynecology

## 2013-06-19 ENCOUNTER — Encounter: Payer: Self-pay | Admitting: Internal Medicine

## 2013-07-01 ENCOUNTER — Ambulatory Visit: Payer: Self-pay

## 2013-07-07 ENCOUNTER — Encounter: Payer: Self-pay | Admitting: Internal Medicine

## 2013-10-16 ENCOUNTER — Encounter: Payer: Self-pay | Admitting: Internal Medicine

## 2013-10-30 ENCOUNTER — Ambulatory Visit: Payer: Self-pay

## 2013-11-10 ENCOUNTER — Encounter: Payer: Self-pay | Admitting: Obstetrics and Gynecology

## 2013-11-19 ENCOUNTER — Ambulatory Visit: Payer: Self-pay

## 2013-11-25 ENCOUNTER — Emergency Department (HOSPITAL_COMMUNITY)
Admission: EM | Admit: 2013-11-25 | Discharge: 2013-11-25 | Disposition: A | Discharge status: Home / Self Care | Payer: Self-pay | Attending: Emergency Medicine | Admitting: Emergency Medicine

## 2013-11-25 ENCOUNTER — Encounter (HOSPITAL_COMMUNITY): Payer: Self-pay | Admitting: *Deleted

## 2013-11-25 DIAGNOSIS — Z3202 Encounter for pregnancy test, result negative: Secondary | ICD-10-CM | POA: Insufficient documentation

## 2013-11-25 DIAGNOSIS — Z79899 Other long term (current) drug therapy: Secondary | ICD-10-CM | POA: Insufficient documentation

## 2013-11-25 DIAGNOSIS — M549 Dorsalgia, unspecified: Secondary | ICD-10-CM | POA: Insufficient documentation

## 2013-11-25 DIAGNOSIS — Z8619 Personal history of other infectious and parasitic diseases: Secondary | ICD-10-CM | POA: Insufficient documentation

## 2013-11-25 DIAGNOSIS — F410 Panic disorder [episodic paroxysmal anxiety] without agoraphobia: Secondary | ICD-10-CM | POA: Insufficient documentation

## 2013-11-25 DIAGNOSIS — E1165 Type 2 diabetes mellitus with hyperglycemia: Secondary | ICD-10-CM | POA: Insufficient documentation

## 2013-11-25 DIAGNOSIS — R079 Chest pain, unspecified: Secondary | ICD-10-CM | POA: Insufficient documentation

## 2013-11-25 DIAGNOSIS — R739 Hyperglycemia, unspecified: Secondary | ICD-10-CM

## 2013-11-25 DIAGNOSIS — Z8719 Personal history of other diseases of the digestive system: Secondary | ICD-10-CM | POA: Insufficient documentation

## 2013-11-25 LAB — URINALYSIS, ROUTINE W REFLEX MICROSCOPIC
BILIRUBIN URINE: NEGATIVE
HGB URINE DIPSTICK: NEGATIVE
KETONES UR: NEGATIVE mg/dL
Leukocytes, UA: NEGATIVE
Nitrite: NEGATIVE
PROTEIN: NEGATIVE mg/dL
Specific Gravity, Urine: 1.037 — ABNORMAL HIGH (ref 1.005–1.030)
UROBILINOGEN UA: 1 mg/dL (ref 0.0–1.0)
pH: 6 (ref 5.0–8.0)

## 2013-11-25 LAB — URINE MICROSCOPIC-ADD ON

## 2013-11-25 LAB — CBG MONITORING, ED
GLUCOSE-CAPILLARY: 211 mg/dL — AB (ref 70–99)
Glucose-Capillary: 236 mg/dL — ABNORMAL HIGH (ref 70–99)

## 2013-11-25 LAB — POC URINE PREG, ED: PREG TEST UR: NEGATIVE

## 2013-11-25 MED ORDER — SODIUM CHLORIDE 0.9 % IV BOLUS (SEPSIS)
1000.0000 mL | Freq: Once | INTRAVENOUS | Status: AC
  Administered 2013-11-25: 1000 mL via INTRAVENOUS

## 2013-11-25 MED ORDER — METFORMIN HCL 500 MG PO TABS: 500.0000 mg | ORAL_TABLET | Freq: Two times a day (BID) | ORAL | Status: DC

## 2013-11-25 NOTE — ED Notes (Signed)
Patient resting comfortably, talking with visitor at bedside Patient awaiting EDP assessment

## 2013-11-25 NOTE — ED Provider Notes (Signed)
CSN: [id number]     Arrival date & time 11/25/13  1644 History   First MD Initiated Contact with Patient 11/25/13 1904     Chief Complaint  Patient presents with  . Panic Attack  . Hyperglycemia     (Consider location/radiation/quality/duration/timing/severity/associated sxs/prior Treatment) Patient is a 23 y.o. female presenting with hyperglycemia. The history is provided by the patient.  Hyperglycemia Blood sugar level PTA:  200's Severity:  Mild Onset quality:  Gradual Timing:  Constant Progression:  Unchanged Chronicity:  Recurrent Diabetes status:  Controlled with oral medications Current diabetic therapy:  Metformin Relieved by:  Nothing Ineffective treatments:  None tried Associated symptoms: chest pain   Associated symptoms: no abdominal pain, no dizziness, no dysuria, no fatigue, no fever, no nausea, no shortness of breath and no vomiting   Chest pain:    Quality:  Aching   Severity:  Mild   Onset quality:  Sudden   Duration:  1 hour   Timing:  Constant   Progression:  Resolved   Chronicity:  Recurrent   Past Medical History  Diagnosis Date  . Acid reflux   . Gonorrhea June 2013  . Diabetes mellitus     metformin   Past Surgical History  Procedure Laterality Date  . No past surgeries     Family History  Problem Relation Age of Onset  . Diabetes Mother   . Hypertension Father   . Heart disease Father   . Diabetes Maternal Aunt   . Hypertension Maternal Aunt   . Diabetes Maternal Uncle   . Hypertension Maternal Uncle   . Cancer Maternal Uncle   . Hypertension Paternal Aunt   . Hypertension Paternal Uncle    History  Substance Use Topics  . Smoking status: Never Smoker   . Smokeless tobacco: Never Used  . Alcohol Use: No   OB History    Gravida Para Term Preterm AB TAB SAB Ectopic Multiple Living   1 1       1  0     Review of Systems  Constitutional: Negative for fever and fatigue.  HENT: Negative for congestion and drooling.   Eyes:  Negative for pain.  Respiratory: Negative for cough and shortness of breath.   Cardiovascular: Positive for chest pain.  Gastrointestinal: Negative for nausea, vomiting, abdominal pain and diarrhea.  Genitourinary: Negative for dysuria and hematuria.  Musculoskeletal: Positive for back pain. Negative for gait problem and neck pain.  Skin: Negative for color change.  Neurological: Negative for dizziness and headaches.  Hematological: Negative for adenopathy.  Psychiatric/Behavioral: Negative for behavioral problems.       Anxiety  All other systems reviewed and are negative.     Allergies  Review of patient's allergies indicates no known allergies.  Home Medications   Prior to Admission medications   Medication Sig Start Date End Date Taking? Authorizing Provider  diphenhydrAMINE (BENADRYL) 25 MG tablet Take 50 mg by mouth every 6 (six) hours as needed for itching or allergies (allergies & itching).   Yes Historical Provider, MD  metFORMIN (GLUCOPHAGE) 500 MG tablet Take 1 tablet (500 mg total) by mouth 2 (two) times daily with a meal. 03/14/13  Yes Debbe Odea, MD  glimepiride (AMARYL) 2 MG tablet Take 1 tablet (2 mg total) by mouth daily before breakfast. 03/14/13   Debbe Odea, MD   BP 130/72 mmHg  Pulse 87  Temp(Src) 98 F (36.7 C) (Oral)  Resp 20  SpO2 100% Physical Exam  Constitutional: She is oriented  to person, place, and time. She appears well-developed and well-nourished.  HENT:  Head: Normocephalic.  Mouth/Throat: No oropharyngeal exudate.  Eyes: Conjunctivae and EOM are normal. Pupils are equal, round, and reactive to light.  Neck: Normal range of motion. Neck supple.  Cardiovascular: Normal rate, regular rhythm, normal heart sounds and intact distal pulses.  Exam reveals no gallop and no friction rub.   No murmur heard. Pulmonary/Chest: Effort normal and breath sounds normal. No respiratory distress. She has no wheezes.  Abdominal: Soft. Bowel sounds are normal.  There is no tenderness. There is no rebound and no guarding.  Musculoskeletal: Normal range of motion. She exhibits no edema or tenderness.  Neurological: She is alert and oriented to person, place, and time.  Skin: Skin is warm and dry.  Psychiatric: She has a normal mood and affect. Her behavior is normal.  Nursing note and vitals reviewed.   ED Course  Procedures (including critical care time) Labs Review Labs Reviewed  URINALYSIS, ROUTINE W REFLEX MICROSCOPIC - Abnormal; Notable for the following:    APPearance CLOUDY (*)    Specific Gravity, Urine 1.037 (*)    Glucose, UA >1000 (*)    All other components within normal limits  URINE MICROSCOPIC-ADD ON - Abnormal; Notable for the following:    Squamous Epithelial / LPF FEW (*)    All other components within normal limits  CBG MONITORING, ED - Abnormal; Notable for the following:    Glucose-Capillary 236 (*)    All other components within normal limits  CBG MONITORING, ED - Abnormal; Notable for the following:    Glucose-Capillary 211 (*)    All other components within normal limits  POC URINE PREG, ED    Imaging Review No results found.   EKG Interpretation   Date/Time:  Tuesday November 25 2013 19:34:42 EST Ventricular Rate:  78 PR Interval:  125 QRS Duration: 86 QT Interval:  382 QTC Calculation: 435 R Axis:   84 Text Interpretation:  Sinus rhythm Confirmed by Jesse Nosbisch  MD, Jodiann Ognibene  ([id number]) on 11/25/2013 7:43:44 PM      MDM   Final diagnoses:  Anxiety attack  Hyperglycemia    7:18 PM 23 y.o. female w hx of DM on metformin who presents after a panic attack. She states that she awoke this morning with some mild back pain and began feeling anxious around 1 PM and developed some chest pain. She states that her symptoms were consistent with anxiety attacks she has had previously. EMS was called and found her blood sugar to be incidentally elevated in the 200s. The patient states she has been noncompliant with her  metformin and has not taken it and 1.5 weeks. She currently denies any back pain, chest pain, or shortness of breath. She is currently asymptomatic and her vital signs are unremarkable here.she states that she has follow-up with her doctor tomorrow.she is well-appearing on exam and I don't think that laboratory studies are needed. She has gotten a liter of fluids and her blood sugar has titrated down appropriately. Urinalysis and U Preg are negative.  7:43 PM:  I have discussed the diagnosis/risks/treatment options with the patient and believe the pt to be eligible for discharge home to follow-up with her pcp. Will provide Rx for metformin as she has run out. We also discussed returning to the ED immediately if new or worsening sx occur. We discussed the sx which are most concerning (e.g., return of cp, sob) that necessitate immediate return. Medications administered to the  patient during their visit and any new prescriptions provided to the patient are listed below.  Medications given during this visit Medications  sodium chloride 0.9 % bolus 1,000 mL (0 mLs Intravenous Stopped 11/25/13 1927)    New Prescriptions   METFORMIN (GLUCOPHAGE) 500 MG TABLET    Take 1 tablet (500 mg total) by mouth 2 (two) times daily with a meal.     Pamella Pert, MD 11/26/13 0011

## 2013-11-25 NOTE — ED Notes (Signed)
Bed: [facility] Expected date:  Expected time:  Means of arrival:  Comments: EMS hypoglycemia 

## 2013-11-25 NOTE — ED Notes (Signed)
EMS initially called out for anxiety and panic attacks Patient then states that she has missed her DM medications x 1 week and admits to non-compliance CBG 282 by EMS 500 ml NS given by EMS Patient states that she last ate around 1300

## 2013-11-25 NOTE — Discharge Instructions (Signed)

## 2013-11-26 ENCOUNTER — Encounter: Payer: Self-pay | Admitting: Internal Medicine

## 2013-12-10 ENCOUNTER — Encounter (HOSPITAL_COMMUNITY): Payer: Self-pay | Admitting: Emergency Medicine

## 2013-12-10 ENCOUNTER — Emergency Department (HOSPITAL_COMMUNITY)
Admission: EM | Admit: 2013-12-10 | Discharge: 2013-12-10 | Disposition: A | Discharge status: Home / Self Care | Payer: Self-pay | Attending: Emergency Medicine | Admitting: Emergency Medicine

## 2013-12-10 ENCOUNTER — Ambulatory Visit: Payer: Self-pay

## 2013-12-10 DIAGNOSIS — R112 Nausea with vomiting, unspecified: Secondary | ICD-10-CM | POA: Insufficient documentation

## 2013-12-10 DIAGNOSIS — Z79899 Other long term (current) drug therapy: Secondary | ICD-10-CM | POA: Insufficient documentation

## 2013-12-10 DIAGNOSIS — Z3202 Encounter for pregnancy test, result negative: Secondary | ICD-10-CM | POA: Insufficient documentation

## 2013-12-10 DIAGNOSIS — R197 Diarrhea, unspecified: Secondary | ICD-10-CM | POA: Insufficient documentation

## 2013-12-10 DIAGNOSIS — K219 Gastro-esophageal reflux disease without esophagitis: Secondary | ICD-10-CM | POA: Insufficient documentation

## 2013-12-10 DIAGNOSIS — R109 Unspecified abdominal pain: Secondary | ICD-10-CM | POA: Insufficient documentation

## 2013-12-10 DIAGNOSIS — E119 Type 2 diabetes mellitus without complications: Secondary | ICD-10-CM | POA: Insufficient documentation

## 2013-12-10 DIAGNOSIS — Z8619 Personal history of other infectious and parasitic diseases: Secondary | ICD-10-CM | POA: Insufficient documentation

## 2013-12-10 LAB — URINALYSIS, ROUTINE W REFLEX MICROSCOPIC
Bilirubin Urine: NEGATIVE
Glucose, UA: 100 mg/dL — AB
Ketones, ur: 15 mg/dL — AB
Leukocytes, UA: NEGATIVE
Nitrite: NEGATIVE
Protein, ur: 30 mg/dL — AB
SPECIFIC GRAVITY, URINE: 1.027 (ref 1.005–1.030)
Urobilinogen, UA: 0.2 mg/dL (ref 0.0–1.0)
pH: 5.5 (ref 5.0–8.0)

## 2013-12-10 LAB — CBC WITH DIFFERENTIAL/PLATELET
BASOS PCT: 0 % (ref 0–1)
Basophils Absolute: 0 10*3/uL (ref 0.0–0.1)
Eosinophils Absolute: 0 10*3/uL (ref 0.0–0.7)
Eosinophils Relative: 0 % (ref 0–5)
HCT: 36.7 % (ref 36.0–46.0)
HEMOGLOBIN: 11.5 g/dL — AB (ref 12.0–15.0)
LYMPHS PCT: 14 % (ref 12–46)
Lymphs Abs: 1.5 10*3/uL (ref 0.7–4.0)
MCH: 23.9 pg — ABNORMAL LOW (ref 26.0–34.0)
MCHC: 31.3 g/dL (ref 30.0–36.0)
MCV: 76.3 fL — ABNORMAL LOW (ref 78.0–100.0)
MONOS PCT: 3 % (ref 3–12)
Monocytes Absolute: 0.3 10*3/uL (ref 0.1–1.0)
NEUTROS ABS: 8.9 10*3/uL — AB (ref 1.7–7.7)
NEUTROS PCT: 83 % — AB (ref 43–77)
Platelets: 457 10*3/uL — ABNORMAL HIGH (ref 150–400)
RBC: 4.81 MIL/uL (ref 3.87–5.11)
RDW: 15.5 % (ref 11.5–15.5)
WBC: 10.6 10*3/uL — AB (ref 4.0–10.5)

## 2013-12-10 LAB — COMPREHENSIVE METABOLIC PANEL
ALBUMIN: 4.1 g/dL (ref 3.5–5.2)
ALK PHOS: 90 U/L (ref 39–117)
ALT: 16 U/L (ref 0–35)
AST: 17 U/L (ref 0–37)
Anion gap: 18 — ABNORMAL HIGH (ref 5–15)
BUN: 10 mg/dL (ref 6–23)
CHLORIDE: 100 meq/L (ref 96–112)
CO2: 20 meq/L (ref 19–32)
Calcium: 10.2 mg/dL (ref 8.4–10.5)
Creatinine, Ser: 0.57 mg/dL (ref 0.50–1.10)
GFR calc Af Amer: >90 mL/min (ref >90)
GFR calc non Af Amer: >90 mL/min (ref >90)
Glucose, Bld: 151 mg/dL — ABNORMAL HIGH (ref 70–99)
Potassium: 4.2 mEq/L (ref 3.7–5.3)
Sodium: 138 mEq/L (ref 137–147)
Total Bilirubin: 0.7 mg/dL (ref 0.3–1.2)
Total Protein: 8.8 g/dL — ABNORMAL HIGH (ref 6.0–8.3)

## 2013-12-10 LAB — POC URINE PREG, ED: Preg Test, Ur: NEGATIVE

## 2013-12-10 LAB — URINE MICROSCOPIC-ADD ON

## 2013-12-10 LAB — CBG MONITORING, ED: GLUCOSE-CAPILLARY: 149 mg/dL — AB (ref 70–99)

## 2013-12-10 LAB — LIPASE, BLOOD: Lipase: 25 U/L (ref 11–59)

## 2013-12-10 MED ORDER — HYOSCYAMINE SULFATE 0.125 MG SL SUBL: 0.1250 mg | SUBLINGUAL_TABLET | Freq: Four times a day (QID) | SUBLINGUAL | Status: DC | PRN

## 2013-12-10 MED ORDER — ONDANSETRON HCL 4 MG/2ML IJ SOLN
4.0000 mg | Freq: Once | INTRAMUSCULAR | Status: AC
  Administered 2013-12-10: 4 mg via INTRAVENOUS
  Filled 2013-12-10: qty 2

## 2013-12-10 MED ORDER — HYOSCYAMINE SULFATE 0.125 MG PO TABS
0.1250 mg | ORAL_TABLET | Freq: Once | ORAL | Status: AC
  Administered 2013-12-10: 0.125 mg via ORAL
  Filled 2013-12-10 (×2): qty 1

## 2013-12-10 MED ORDER — PROMETHAZINE HCL 25 MG PO TABS: 25.0000 mg | ORAL_TABLET | Freq: Four times a day (QID) | ORAL | Status: DC | PRN

## 2013-12-10 MED ORDER — SODIUM CHLORIDE 0.9 % IV BOLUS (SEPSIS)
1000.0000 mL | Freq: Once | INTRAVENOUS | Status: AC
  Administered 2013-12-10: 1000 mL via INTRAVENOUS

## 2013-12-10 NOTE — ED Notes (Signed)
Pt sts unable to tolerate Diet Coke without feeling nauseated.  Will try water to minimize bubbles/caffeine.

## 2013-12-10 NOTE — ED Provider Notes (Signed)
CSN: [id number]     Arrival date & time 12/10/13  1215 History   First MD Initiated Contact with Patient 12/10/13 1254     Chief Complaint  Patient presents with  . Nausea  . Diarrhea  . Abdominal Pain     Patient is a 23 y.o. female presenting with diarrhea and abdominal pain. The history is provided by the patient.  Diarrhea Associated symptoms: abdominal pain   Abdominal Pain Associated symptoms: diarrhea    Patient presents for evaluation of vomiting, diarrhea, and abdominal pain. Patient presents with 3 days of diffuse, but greatest over the epigastrium, abdominal pain. Symptoms started shortly after having [employer] salad for dinner on Sunday night. She reports 6-7 episodes of emesis and diarrhea since that time. She is having no blood in her stool or vomit. She denies fevers, cough, dysuria. She has a history of diabetes and takes metformin, she has no additional medical problems and she does not have a history of abdominal surgeries. No known sick contacts. Symptoms are moderate, constant and worsening  Past Medical History  Diagnosis Date  . Acid reflux   . Gonorrhea June 2013  . Diabetes mellitus     metformin   Past Surgical History  Procedure Laterality Date  . No past surgeries     Family History  Problem Relation Age of Onset  . Diabetes Mother   . Hypertension Father   . Heart disease Father   . Diabetes Maternal Aunt   . Hypertension Maternal Aunt   . Diabetes Maternal Uncle   . Hypertension Maternal Uncle   . Cancer Maternal Uncle   . Hypertension Paternal Aunt   . Hypertension Paternal Uncle    History  Substance Use Topics  . Smoking status: Never Smoker   . Smokeless tobacco: Never Used  . Alcohol Use: No   OB History    Gravida Para Term Preterm AB TAB SAB Ectopic Multiple Living   1 1       1  0     Review of Systems  Gastrointestinal: Positive for abdominal pain and diarrhea.  All other systems reviewed and are negative.     Allergies   Review of patient's allergies indicates no known allergies.  Home Medications   Prior to Admission medications   Medication Sig Start Date End Date Taking? Authorizing Provider  metFORMIN (GLUCOPHAGE) 500 MG tablet Take 1 tablet (500 mg total) by mouth 2 (two) times daily with a meal. 11/25/13  Yes Pamella Pert, MD  glimepiride (AMARYL) 2 MG tablet Take 1 tablet (2 mg total) by mouth daily before breakfast. Patient not taking: Reported on 11/27/2013 03/14/13   Debbe Odea, MD  metFORMIN (GLUCOPHAGE) 500 MG tablet Take 1 tablet (500 mg total) by mouth 2 (two) times daily with a meal. 03/14/13   Debbe Odea, MD   BP 123/88 mmHg  Pulse 93  Temp(Src) 98.3 F (36.8 C) (Oral)  Resp 16  Ht 5\' 6"  (1.676 m)  Wt 235 lb (106.595 kg)  BMI 37.95 kg/m2  SpO2 100% Physical Exam  Constitutional: She is oriented to person, place, and time. She appears well-developed and well-nourished.  HENT:  Head: Normocephalic and atraumatic.  Cardiovascular: Normal rate and regular rhythm.   No murmur heard. Pulmonary/Chest: Effort normal and breath sounds normal. No respiratory distress.  Abdominal: Soft. There is no rebound and no guarding.  Mild diffuse abdominal tenderness  Musculoskeletal: She exhibits no edema or tenderness.  Neurological: She is alert and oriented to person,  place, and time.  Skin: Skin is warm and dry.  Psychiatric: She has a normal mood and affect. Her behavior is normal.  Nursing note and vitals reviewed.   ED Course  Procedures (including critical care time) Labs Review Labs Reviewed  CBC WITH DIFFERENTIAL - Abnormal; Notable for the following:    WBC 10.6 (*)    Hemoglobin 11.5 (*)    MCV 76.3 (*)    MCH 23.9 (*)    Platelets 457 (*)    Neutrophils Relative % 83 (*)    Neutro Abs 8.9 (*)    All other components within normal limits  COMPREHENSIVE METABOLIC PANEL - Abnormal; Notable for the following:    Glucose, Bld 151 (*)    Total Protein 8.8 (*)    Anion gap  18 (*)    All other components within normal limits  URINALYSIS, ROUTINE W REFLEX MICROSCOPIC - Abnormal; Notable for the following:    Color, Urine AMBER (*)    APPearance CLOUDY (*)    Glucose, UA 100 (*)    Hgb urine dipstick MODERATE (*)    Ketones, ur 15 (*)    Protein, ur 30 (*)    All other components within normal limits  URINE MICROSCOPIC-ADD ON - Abnormal; Notable for the following:    Squamous Epithelial / LPF MANY (*)    Bacteria, UA FEW (*)    All other components within normal limits  CBG MONITORING, ED - Abnormal; Notable for the following:    Glucose-Capillary 149 (*)    All other components within normal limits  LIPASE, BLOOD  POC URINE PREG, ED    Imaging Review No results found.   EKG Interpretation None      MDM   Final diagnoses:  Non-intractable vomiting with nausea, vomiting of unspecified type  Diarrhea    Pt here with V/D/epigastric AP.  Clinical picture not c/w appendicitis, cholecystitis, serious bacterial infection.  Pt improved on recheck and tolerating po fluids.  Discussed with pt home po fluid hydration as well as pcp followup/return precautions.     Quintella Reichert, MD 12/10/13 1538

## 2013-12-10 NOTE — ED Notes (Signed)
CBG is 149. Notified Nurse Andee Poles.

## 2013-12-10 NOTE — ED Notes (Signed)
Pt from home for eval of lower abd pain associated with n/v/d since Sunday. Pt reports eating a [employer] salad and started to develop symptoms Sunday night, ongoing since then. Pt also reports hx of diabetes and has not been able to keep metformin meds down, last took her medication yesterday at lunch. Denies any hemoptysis or blood in stool. Nad noted, axo x4.

## 2013-12-10 NOTE — ED Notes (Signed)
Patient stated that after a few swallows her stomach felt a little nauseated.

## 2013-12-10 NOTE — Discharge Instructions (Signed)
Get rechecked in the next 24 hours if you have new abdominal pain or develop fevers, have uncontrolled vomiting and diarrhea, or get new, concerning symptoms.  Nausea and Vomiting Nausea is a sick feeling that often comes before throwing up (vomiting). Vomiting is a reflex where stomach contents come out of your mouth. Vomiting can cause severe loss of body fluids (dehydration). Children and elderly adults can become dehydrated quickly, especially if they also have diarrhea. Nausea and vomiting are symptoms of a condition or disease. It is important to find the cause of your symptoms. CAUSES   Direct irritation of the stomach lining. This irritation can result from increased acid production (gastroesophageal reflux disease), infection, food poisoning, taking certain medicines (such as nonsteroidal anti-inflammatory drugs), alcohol use, or tobacco use.  Signals from the brain.These signals could be caused by a headache, heat exposure, an inner ear disturbance, increased pressure in the brain from injury, infection, a tumor, or a concussion, pain, emotional stimulus, or metabolic problems.  An obstruction in the gastrointestinal tract (bowel obstruction).  Illnesses such as diabetes, hepatitis, gallbladder problems, appendicitis, kidney problems, cancer, sepsis, atypical symptoms of a heart attack, or eating disorders.  Medical treatments such as chemotherapy and radiation.  Receiving medicine that makes you sleep (general anesthetic) during surgery. DIAGNOSIS Your caregiver may ask for tests to be done if the problems do not improve after a few days. Tests may also be done if symptoms are severe or if the reason for the nausea and vomiting is not clear. Tests may include:  Urine tests.  Blood tests.  Stool tests.  Cultures (to look for evidence of infection).  X-rays or other imaging studies. Test results can help your caregiver make decisions about treatment or the need for additional  tests. TREATMENT You need to stay well hydrated. Drink frequently but in small amounts.You may wish to drink water, sports drinks, clear broth, or eat frozen ice pops or gelatin dessert to help stay hydrated.When you eat, eating slowly may help prevent nausea.There are also some antinausea medicines that may help prevent nausea. HOME CARE INSTRUCTIONS   Take all medicine as directed by your caregiver.  If you do not have an appetite, do not force yourself to eat. However, you must continue to drink fluids.  If you have an appetite, eat a normal diet unless your caregiver tells you differently.  Eat a variety of complex carbohydrates (rice, wheat, potatoes, bread), lean meats, yogurt, fruits, and vegetables.  Avoid high-fat foods because they are more difficult to digest.  Drink enough water and fluids to keep your urine clear or pale yellow.  If you are dehydrated, ask your caregiver for specific rehydration instructions. Signs of dehydration may include:  Severe thirst.  Dry lips and mouth.  Dizziness.  Dark urine.  Decreasing urine frequency and amount.  Confusion.  Rapid breathing or pulse. SEEK IMMEDIATE MEDICAL CARE IF:   You have blood or brown flecks (like coffee grounds) in your vomit.  You have black or bloody stools.  You have a severe headache or stiff neck.  You are confused.  You have severe abdominal pain.  You have chest pain or trouble breathing.  You do not urinate at least once every 8 hours.  You develop cold or clammy skin.  You continue to vomit for longer than 24 to 48 hours.  You have a fever. MAKE SURE YOU:   Understand these instructions.  Will watch your condition.  Will get help right away if you  are not doing well or get worse. Document Released: 12/26/2004 Document Revised: 03/20/2011 Document Reviewed: 05/25/2010 [facility] Patient Information 2015 [place], [place]. This information is not intended to replace advice given to  you by your health care provider. Make sure you discuss any questions you have with your health care provider.

## 2014-02-13 ENCOUNTER — Ambulatory Visit: Payer: Self-pay | Attending: Internal Medicine | Admitting: Internal Medicine

## 2014-02-13 VITALS — BP 142/87 | HR 88 | Temp 98.0°F | Resp 18

## 2014-02-13 DIAGNOSIS — E119 Type 2 diabetes mellitus without complications: Secondary | ICD-10-CM | POA: Insufficient documentation

## 2014-02-13 DIAGNOSIS — E1165 Type 2 diabetes mellitus with hyperglycemia: Secondary | ICD-10-CM

## 2014-02-13 DIAGNOSIS — J069 Acute upper respiratory infection, unspecified: Secondary | ICD-10-CM | POA: Insufficient documentation

## 2014-02-13 LAB — GLUCOSE, POCT (MANUAL RESULT ENTRY): POC GLUCOSE: 277 mg/dL — AB (ref 70–99)

## 2014-02-13 MED ORDER — AZITHROMYCIN 250 MG PO TABS: ORAL_TABLET | ORAL | Status: DC

## 2014-02-13 NOTE — Patient Instructions (Addendum)
Upper Respiratory Infection, Adult An upper respiratory infection (URI) is also sometimes known as the common cold. The upper respiratory tract includes the nose, sinuses, throat, trachea, and bronchi. Bronchi are the airways leading to the lungs. Most people improve within 1 week, but symptoms can last up to 2 weeks. A residual cough may last even longer.  CAUSES Many different viruses can infect the tissues lining the upper respiratory tract. The tissues become irritated and inflamed and often become very moist. Mucus production is also common. A cold is contagious. You can easily spread the virus to others by oral contact. This includes kissing, sharing a glass, coughing, or sneezing. Touching your mouth or nose and then touching a surface, which is then touched by another person, can also spread the virus. SYMPTOMS  Symptoms typically develop 1 to 3 days after you come in contact with a cold virus. Symptoms vary from person to person. They may include:  Runny nose.  Sneezing.  Nasal congestion.  Sinus irritation.  Sore throat.  Loss of voice (laryngitis).  Cough.  Fatigue.  Muscle aches.  Loss of appetite.  Headache.  Low-grade fever. DIAGNOSIS  You might diagnose your own cold based on familiar symptoms, since most people get a cold 2 to 3 times a year. Your caregiver can confirm this based on your exam. Most importantly, your caregiver can check that your symptoms are not due to another disease such as strep throat, sinusitis, pneumonia, asthma, or epiglottitis. Blood tests, throat tests, and X-rays are not necessary to diagnose a common cold, but they may sometimes be helpful in excluding other more serious diseases. Your caregiver will decide if any further tests are required. RISKS AND COMPLICATIONS  You may be at risk for a more severe case of the common cold if you smoke cigarettes, have chronic heart disease (such as heart failure) or lung disease (such as asthma), or if  you have a weakened immune system. The very young and very old are also at risk for more serious infections. Bacterial sinusitis, middle ear infections, and bacterial pneumonia can complicate the common cold. The common cold can worsen asthma and chronic obstructive pulmonary disease (COPD). Sometimes, these complications can require emergency medical care and may be life-threatening. PREVENTION  The best way to protect against getting a cold is to practice good hygiene. Avoid oral or hand contact with people with cold symptoms. Wash your hands often if contact occurs. There is no clear evidence that vitamin C, vitamin E, echinacea, or exercise reduces the chance of developing a cold. However, it is always recommended to get plenty of rest and practice good nutrition. TREATMENT  Treatment is directed at relieving symptoms. There is no cure. Antibiotics are not effective, because the infection is caused by a virus, not by bacteria. Treatment may include:  Increased fluid intake. Sports drinks offer valuable electrolytes, sugars, and fluids.  Breathing heated mist or steam (vaporizer or shower).  Eating chicken soup or other clear broths, and maintaining good nutrition.  Getting plenty of rest.  Using gargles or lozenges for comfort.  Controlling fevers with ibuprofen or acetaminophen as directed by your caregiver.  Increasing usage of your inhaler if you have asthma. Zinc gel and zinc lozenges, taken in the first 24 hours of the common cold, can shorten the duration and lessen the severity of symptoms. Pain medicines may help with fever, muscle aches, and throat pain. A variety of non-prescription medicines are available to treat congestion and runny nose. Your caregiver   can make recommendations and may suggest nasal or lung inhalers for other symptoms.  HOME CARE INSTRUCTIONS   Only take over-the-counter or prescription medicines for pain, discomfort, or fever as directed by your  caregiver.  Use a warm mist humidifier or inhale steam from a shower to increase air moisture. This may keep secretions moist and make it easier to breathe.  Drink enough water and fluids to keep your urine clear or pale yellow.  Rest as needed.  Return to work when your temperature has returned to normal or as your caregiver advises. You may need to stay home longer to avoid infecting others. You can also use a face mask and careful hand washing to prevent spread of the virus. SEEK MEDICAL CARE IF:   After the first few days, you feel you are getting worse rather than better.  You need your caregiver's advice about medicines to control symptoms.  You develop chills, worsening shortness of breath, or brown or red sputum. These may be signs of pneumonia.  You develop yellow or brown nasal discharge or pain in the face, especially when you bend forward. These may be signs of sinusitis.  You develop a fever, swollen neck glands, pain with swallowing, or white areas in the back of your throat. These may be signs of strep throat. SEEK IMMEDIATE MEDICAL CARE IF:   You have a fever.  You develop severe or persistent headache, ear pain, sinus pain, or chest pain.  You develop wheezing, a prolonged cough, cough up blood, or have a change in your usual mucus (if you have chronic lung disease).  You develop sore muscles or a stiff neck. Document Released: 06/21/2000 Document Revised: 03/20/2011 Document Reviewed: 04/02/2013 [facility] Patient Information 2015 [place], [place]. This information is not intended to replace advice given to you by your health care provider. Make sure you discuss any questions you have with your health care provider. DASH Eating Plan DASH stands for "Dietary Approaches to Stop Hypertension." The DASH eating plan is a healthy eating plan that has been shown to reduce high blood pressure (hypertension). Additional health benefits may include reducing the risk of type 2  diabetes mellitus, heart disease, and stroke. The DASH eating plan may also help with weight loss. WHAT DO I NEED TO KNOW ABOUT THE DASH EATING PLAN? For the DASH eating plan, you will follow these general guidelines:  Choose foods with a percent daily value for sodium of less than 5% (as listed on the food label).  Use salt-free seasonings or herbs instead of table salt or sea salt.  Check with your health care provider or pharmacist before using salt substitutes.  Eat lower-sodium products, often labeled as "lower sodium" or "no salt added."  Eat fresh foods.  Eat more vegetables, fruits, and low-fat dairy products.  Choose whole grains. Look for the word "whole" as the first word in the ingredient list.  Choose fish and skinless chicken or [place] more often than red meat. Limit fish, poultry, and meat to 6 oz (170 g) each day.  Limit sweets, desserts, sugars, and sugary drinks.  Choose heart-healthy fats.  Limit cheese to 1 oz (28 g) per day.  Eat more home-cooked food and less restaurant, buffet, and fast food.  Limit fried foods.  Cook foods using methods other than frying.  Limit canned vegetables. If you do use them, rinse them well to decrease the sodium.  When eating at a restaurant, ask that your food be prepared with less salt, or  no salt if possible. WHAT FOODS CAN I EAT? Seek help from a dietitian for individual calorie needs. Grains Whole grain or whole wheat bread. Brown rice. Whole grain or whole wheat pasta. Quinoa, bulgur, and whole grain cereals. Low-sodium cereals. Corn or whole wheat flour tortillas. Whole grain cornbread. Whole grain crackers. Low-sodium crackers. Vegetables Fresh or frozen vegetables (raw, steamed, roasted, or grilled). Low-sodium or reduced-sodium tomato and vegetable juices. Low-sodium or reduced-sodium tomato sauce and paste. Low-sodium or reduced-sodium canned vegetables.  Fruits All fresh, canned (in natural juice), or frozen  fruits. Meat and Other Protein Products Ground beef (85% or leaner), grass-fed beef, or beef trimmed of fat. Skinless chicken or [place]. Ground chicken or [place]. Pork trimmed of fat. All fish and seafood. Eggs. Dried beans, peas, or lentils. Unsalted nuts and seeds. Unsalted canned beans. Dairy Low-fat dairy products, such as skim or 1% milk, 2% or reduced-fat cheeses, low-fat ricotta or cottage cheese, or plain low-fat yogurt. Low-sodium or reduced-sodium cheeses. Fats and Oils Tub margarines without trans fats. Light or reduced-fat mayonnaise and salad dressings (reduced sodium). Avocado. Safflower, olive, or canola oils. Natural peanut or almond butter. Other Unsalted popcorn and pretzels. The items listed above may not be a complete list of recommended foods or beverages. Contact your dietitian for more options. WHAT FOODS ARE NOT RECOMMENDED? Grains White bread. White pasta. White rice. Refined cornbread. Bagels and croissants. Crackers that contain trans fat. Vegetables Creamed or fried vegetables. Vegetables in a cheese sauce. Regular canned vegetables. Regular canned tomato sauce and paste. Regular tomato and vegetable juices. Fruits Dried fruits. Canned fruit in light or heavy syrup. Fruit juice. Meat and Other Protein Products Fatty cuts of meat. Ribs, chicken wings, bacon, sausage, bologna, salami, chitterlings, fatback, hot dogs, bratwurst, and packaged luncheon meats. Salted nuts and seeds. Canned beans with salt. Dairy Whole or 2% milk, cream, half-and-half, and cream cheese. Whole-fat or sweetened yogurt. Full-fat cheeses or blue cheese. Nondairy creamers and whipped toppings. Processed cheese, cheese spreads, or cheese curds. Condiments Onion and garlic salt, seasoned salt, table salt, and sea salt. Canned and packaged gravies. Worcestershire sauce. Tartar sauce. Barbecue sauce. Teriyaki sauce. Soy sauce, including reduced sodium. Steak sauce. Fish sauce. Oyster sauce. Cocktail  sauce. Horseradish. Ketchup and mustard. Meat flavorings and tenderizers. Bouillon cubes. Hot sauce. Tabasco sauce. Marinades. Taco seasonings. Relishes. Fats and Oils Butter, stick margarine, lard, shortening, ghee, and bacon fat. Coconut, palm kernel, or palm oils. Regular salad dressings. Other Pickles and olives. Salted popcorn and pretzels. The items listed above may not be a complete list of foods and beverages to avoid. Contact your dietitian for more information. WHERE CAN I FIND MORE INFORMATION? National Heart, [employer], and [employer] Institute: travelstabloid.com Document Released: 12/15/2010 Document Revised: 05/12/2013 Document Reviewed: 10/30/2012 [facility] Patient Information 2015 [place], [place]. This information is not intended to replace advice given to you by your health care provider. Make sure you discuss any questions you have with your health care provider.

## 2014-02-13 NOTE — Progress Notes (Signed)
Patient walked in c/o 3 day hx body aches, chills, sweating, headache; rates 8/10, nasal congestion and yellow nasal drainage. Sore throat x 1 day 2 days ago Patient denies cough, ear pain,   Has had relief of sx with nyquil Never smoker Patient is diabetic. States hasn't checked BS in months; ate 2 poptarts 1.5 hours ago CBG 277  BP 142/88 Discussed need for low sodium diet and using Mrs. Dash as alternative to salt P 88 T  98.0 oral R 18 SPO2 99%  No tenderness noted upon palpation of maxillary or frontal sinuses No cervical lymphadenopathy noted Ears: right canal with cerumen without redness or drainage, left canal without redness or drainage; tympanic membranes intact without redness or bulging Throat: clear without redness or drainage Nose: Nares engorged with redness and yellow drainage noted Lungs: clear to auscultation with good breath sounds noted  Per PCP: Zpak Take 2 tabs by mouth today then 1 tab by mouth daily for 4 days. Rx e-scribed to [facility]  Patient given literature on [place] and URI  Patient has f/u sched with PCP on 02/23/14 to discuss DM  Patient encouraged to rest, drink plenty of fluids (water) Call back if sx worsen or fail to improve   Agree with RN's assessment and intervention  Lorayne Marek, MD

## 2014-02-23 ENCOUNTER — Ambulatory Visit: Payer: Self-pay | Admitting: Internal Medicine

## 2014-03-13 ENCOUNTER — Emergency Department (HOSPITAL_COMMUNITY)
Admission: EM | Admit: 2014-03-13 | Discharge: 2014-03-13 | Disposition: A | Discharge status: Home / Self Care | Payer: Self-pay | Attending: Emergency Medicine | Admitting: Emergency Medicine

## 2014-03-13 ENCOUNTER — Encounter (HOSPITAL_COMMUNITY): Payer: Self-pay

## 2014-03-13 DIAGNOSIS — Z8619 Personal history of other infectious and parasitic diseases: Secondary | ICD-10-CM | POA: Insufficient documentation

## 2014-03-13 DIAGNOSIS — Z3202 Encounter for pregnancy test, result negative: Secondary | ICD-10-CM | POA: Insufficient documentation

## 2014-03-13 DIAGNOSIS — Z8719 Personal history of other diseases of the digestive system: Secondary | ICD-10-CM | POA: Insufficient documentation

## 2014-03-13 DIAGNOSIS — R350 Frequency of micturition: Secondary | ICD-10-CM | POA: Insufficient documentation

## 2014-03-13 DIAGNOSIS — E119 Type 2 diabetes mellitus without complications: Secondary | ICD-10-CM | POA: Insufficient documentation

## 2014-03-13 DIAGNOSIS — J069 Acute upper respiratory infection, unspecified: Secondary | ICD-10-CM

## 2014-03-13 DIAGNOSIS — Z79899 Other long term (current) drug therapy: Secondary | ICD-10-CM | POA: Insufficient documentation

## 2014-03-13 DIAGNOSIS — N390 Urinary tract infection, site not specified: Secondary | ICD-10-CM | POA: Insufficient documentation

## 2014-03-13 LAB — BASIC METABOLIC PANEL
Anion gap: 13 (ref 5–15)
BUN: 6 mg/dL (ref 6–23)
CO2: 20 mmol/L (ref 19–32)
Calcium: 9.5 mg/dL (ref 8.4–10.5)
Chloride: 101 mmol/L (ref 96–112)
Creatinine, Ser: 0.6 mg/dL (ref 0.50–1.10)
GFR calc Af Amer: >90 mL/min (ref >90)
GFR calc non Af Amer: >90 mL/min (ref >90)
Glucose, Bld: 325 mg/dL — ABNORMAL HIGH (ref 70–99)
Potassium: 4.3 mmol/L (ref 3.5–5.1)
Sodium: 134 mmol/L — ABNORMAL LOW (ref 135–145)

## 2014-03-13 LAB — URINALYSIS, ROUTINE W REFLEX MICROSCOPIC
Bilirubin Urine: NEGATIVE
Ketones, ur: NEGATIVE mg/dL
Leukocytes, UA: NEGATIVE
Nitrite: NEGATIVE
Protein, ur: NEGATIVE mg/dL
SPECIFIC GRAVITY, URINE: 1.037 — AB (ref 1.005–1.030)
UROBILINOGEN UA: 0.2 mg/dL (ref 0.0–1.0)
pH: 6.5 (ref 5.0–8.0)

## 2014-03-13 LAB — CBC WITH DIFFERENTIAL/PLATELET
Basophils Absolute: 0 10*3/uL (ref 0.0–0.1)
Basophils Relative: 1 % (ref 0–1)
Eosinophils Absolute: 0.1 10*3/uL (ref 0.0–0.7)
Eosinophils Relative: 1 % (ref 0–5)
HCT: 34 % — ABNORMAL LOW (ref 36.0–46.0)
Hemoglobin: 10.5 g/dL — ABNORMAL LOW (ref 12.0–15.0)
Lymphocytes Relative: 31 % (ref 12–46)
Lymphs Abs: 1.7 10*3/uL (ref 0.7–4.0)
MCH: 24.1 pg — ABNORMAL LOW (ref 26.0–34.0)
MCHC: 30.9 g/dL (ref 30.0–36.0)
MCV: 78 fL (ref 78.0–100.0)
Monocytes Absolute: 0.4 10*3/uL (ref 0.1–1.0)
Monocytes Relative: 6 % (ref 3–12)
Neutro Abs: 3.4 10*3/uL (ref 1.7–7.7)
Neutrophils Relative %: 61 % (ref 43–77)
Platelets: 307 10*3/uL (ref 150–400)
RBC: 4.36 MIL/uL (ref 3.87–5.11)
RDW: 15.9 % — ABNORMAL HIGH (ref 11.5–15.5)
WBC: 5.6 10*3/uL (ref 4.0–10.5)

## 2014-03-13 LAB — URINE MICROSCOPIC-ADD ON

## 2014-03-13 LAB — POC URINE PREG, ED: Preg Test, Ur: NEGATIVE

## 2014-03-13 LAB — CBG MONITORING, ED: Glucose-Capillary: 292 mg/dL — ABNORMAL HIGH (ref 70–99)

## 2014-03-13 MED ORDER — IBUPROFEN 800 MG PO TABS: 800.0000 mg | ORAL_TABLET | Freq: Three times a day (TID) | ORAL | Status: DC | PRN

## 2014-03-13 MED ORDER — GUAIFENESIN ER 1200 MG PO TB12: 1.0000 | ORAL_TABLET | Freq: Two times a day (BID) | ORAL | Status: DC

## 2014-03-13 MED ORDER — SODIUM CHLORIDE 0.9 % IV BOLUS (SEPSIS)
1000.0000 mL | Freq: Once | INTRAVENOUS | Status: AC
  Administered 2014-03-13: 1000 mL via INTRAVENOUS

## 2014-03-13 NOTE — ED Provider Notes (Signed)
CSN: [id number]     Arrival date & time 03/13/14  0527 History   First MD Initiated Contact with Patient 03/13/14 [phone]     Chief Complaint  Patient presents with  . Urinary Frequency  . Chills     (Consider location/radiation/quality/duration/timing/severity/associated sxs/prior Treatment) HPI Patient presents to the emergency department with nasal congestion.  Chills for the last 4 days.  The patient states that she is concerned that she may be pregnant.  She is also having a little bit of urinary frequency.  States she has not had any fevers that she is aware of the patient states that she does not have any chest pain, shortness of breath, nausea, vomiting, weakness, dizziness, headache, blurred vision, back pain, neck pain, dysuria, cough, sore throat, rash, near syncope or syncope.  The patient states that she did not take any medications prior to arrival.  Nothing seems to make her condition better or worse Past Medical History  Diagnosis Date  . Acid reflux   . Gonorrhea June 2013  . Diabetes mellitus     metformin   Past Surgical History  Procedure Laterality Date  . No past surgeries     Family History  Problem Relation Age of Onset  . Diabetes Mother   . Hypertension Father   . Heart disease Father   . Diabetes Maternal Aunt   . Hypertension Maternal Aunt   . Diabetes Maternal Uncle   . Hypertension Maternal Uncle   . Cancer Maternal Uncle   . Hypertension Paternal Aunt   . Hypertension Paternal Uncle    History  Substance Use Topics  . Smoking status: Never Smoker   . Smokeless tobacco: Never Used  . Alcohol Use: No   OB History    Gravida Para Term Preterm AB TAB SAB Ectopic Multiple Living   1 1       1  0     Review of Systems All other systems negative except as documented in the HPI. All pertinent positives and negatives as reviewed in the HPI.   Allergies  Review of patient's allergies indicates no known allergies.  Home Medications   Prior to  Admission medications   Medication Sig Start Date End Date Taking? Authorizing Provider  metFORMIN (GLUCOPHAGE) 500 MG tablet Take 1 tablet (500 mg total) by mouth 2 (two) times daily with a meal. 11/25/13  Yes Pamella Pert, MD  azithromycin (ZITHROMAX) 250 MG tablet Take 2 tabs by mouth today then 1 tab by mouth daily for 4 days Patient not taking: Reported on 03/13/2014 02/13/14   Lorayne Marek, MD  glimepiride (AMARYL) 2 MG tablet Take 1 tablet (2 mg total) by mouth daily before breakfast. Patient not taking: Reported on 11/27/2013 03/14/13   Debbe Odea, MD  hyoscyamine (LEVSIN/SL) 0.125 MG SL tablet Place 1 tablet (0.125 mg total) under the tongue every 6 (six) hours as needed for cramping. Patient not taking: Reported on 02/13/2014 12/10/13   Quintella Reichert, MD  metFORMIN (GLUCOPHAGE) 500 MG tablet Take 1 tablet (500 mg total) by mouth 2 (two) times daily with a meal. Patient not taking: Reported on 02/13/2014 03/14/13   Debbe Odea, MD  promethazine (PHENERGAN) 25 MG tablet Take 1 tablet (25 mg total) by mouth every 6 (six) hours as needed for nausea or vomiting. Patient not taking: Reported on 02/13/2014 12/10/13   Quintella Reichert, MD   BP 135/83 mmHg  Pulse 87  Temp(Src) 97.8 F (36.6 C) (Oral)  Resp 18  Ht 5\' 6"  (  1.676 m)  Wt 245 lb (111.131 kg)  BMI 39.56 kg/m2  SpO2 100%  LMP 02/05/2014 Physical Exam  Constitutional: She is oriented to person, place, and time. She appears well-developed and well-nourished.  HENT:  Head: Normocephalic and atraumatic.  Nose: Mucosal edema present.  Mouth/Throat: Oropharynx is clear and moist.  Eyes: Pupils are equal, round, and reactive to light.  Neck: Normal range of motion. Neck supple.  Cardiovascular: Normal rate, regular rhythm and normal heart sounds.  Exam reveals no gallop and no friction rub.   No murmur heard. Pulmonary/Chest: Effort normal and breath sounds normal. No respiratory distress.  Neurological: She is alert and oriented to  person, place, and time. She exhibits normal muscle tone. Coordination normal.  Skin: Skin is warm and dry.  Nursing note and vitals reviewed.   ED Course  Procedures (including critical care time) Labs Review Labs Reviewed  URINALYSIS, ROUTINE W REFLEX MICROSCOPIC - Abnormal; Notable for the following:    Specific Gravity, Urine 1.037 (*)    Glucose, UA >1000 (*)    Hgb urine dipstick TRACE (*)    All other components within normal limits  BASIC METABOLIC PANEL - Abnormal; Notable for the following:    Sodium 134 (*)    Glucose, Bld 325 (*)    All other components within normal limits  CBC WITH DIFFERENTIAL/PLATELET - Abnormal; Notable for the following:    Hemoglobin 10.5 (*)    HCT 34.0 (*)    MCH 24.1 (*)    RDW 15.9 (*)    All other components within normal limits  CBG MONITORING, ED - Abnormal; Notable for the following:    Glucose-Capillary 292 (*)    All other components within normal limits  URINE MICROSCOPIC-ADD ON  POC URINE PREG, ED    Patient be treated for a viral URI.  She is told to return here as needed.  Also advised her to follow-up with a primary care doctor.  The patient has nasal congestion over the last 4 days.  She is not in any acute distress at this time.  She does not have any signs of DKA  MDM   Final diagnoses:  None        Brent General, PA-C 03/13/14 0845  Everlene Balls, MD 03/13/14 1529

## 2014-03-13 NOTE — ED Notes (Addendum)
Pt reports chills and nasal congestion x3-4 days. Pt expresses concern that she might be pregnant and has anxiety related to this. Pt in NAD, VSS, respirations even and unlabored. Pt also c/o increased frequency of urination.

## 2014-03-13 NOTE — Discharge Instructions (Signed)
Return here as needed.  Follow-up with your primary care doctor, increase your fluid intake, rest as much as possible

## 2014-03-16 ENCOUNTER — Ambulatory Visit: Payer: Self-pay | Attending: Internal Medicine | Admitting: Internal Medicine

## 2014-03-16 ENCOUNTER — Encounter: Payer: Self-pay | Admitting: Internal Medicine

## 2014-03-16 VITALS — BP 130/80 | HR 93 | Temp 98.0°F | Resp 16 | Wt 222.0 lb

## 2014-03-16 DIAGNOSIS — E139 Other specified diabetes mellitus without complications: Secondary | ICD-10-CM

## 2014-03-16 DIAGNOSIS — IMO0001 Reserved for inherently not codable concepts without codable children: Secondary | ICD-10-CM

## 2014-03-16 DIAGNOSIS — Z833 Family history of diabetes mellitus: Secondary | ICD-10-CM | POA: Insufficient documentation

## 2014-03-16 DIAGNOSIS — R030 Elevated blood-pressure reading, without diagnosis of hypertension: Secondary | ICD-10-CM | POA: Insufficient documentation

## 2014-03-16 DIAGNOSIS — Z9114 Patient's other noncompliance with medication regimen: Secondary | ICD-10-CM | POA: Insufficient documentation

## 2014-03-16 DIAGNOSIS — E1165 Type 2 diabetes mellitus with hyperglycemia: Secondary | ICD-10-CM | POA: Insufficient documentation

## 2014-03-16 DIAGNOSIS — Z794 Long term (current) use of insulin: Secondary | ICD-10-CM | POA: Insufficient documentation

## 2014-03-16 LAB — GLUCOSE, POCT (MANUAL RESULT ENTRY): POC Glucose: 355 mg/dl — AB (ref 70–99)

## 2014-03-16 LAB — POCT GLYCOSYLATED HEMOGLOBIN (HGB A1C): Hemoglobin A1C: 10.2

## 2014-03-16 MED ORDER — INSULIN GLARGINE 100 UNIT/ML SOLOSTAR PEN: 10.0000 [IU] | PEN_INJECTOR | Freq: Every day | SUBCUTANEOUS | Status: DC

## 2014-03-16 MED ORDER — METFORMIN HCL ER 750 MG PO TB24: 750.0000 mg | ORAL_TABLET | Freq: Every day | ORAL | Status: DC

## 2014-03-16 MED ORDER — GLUCOSE BLOOD VI STRP: ORAL_STRIP | Status: DC

## 2014-03-16 MED ORDER — INSULIN ASPART 100 UNIT/ML ~~LOC~~ SOLN
10.0000 [IU] | Freq: Once | SUBCUTANEOUS | Status: AC
  Administered 2014-03-16: 10 [IU] via SUBCUTANEOUS

## 2014-03-16 NOTE — Progress Notes (Signed)
MRN: [id number] Name: Julia Willis  Sex: female Age: 24 y.o. DOB: [birth year]  Allergies: Review of patient's allergies indicates no known allergies.  Chief Complaint  Patient presents with  . Follow-up    HPI: Patient is 24 y.o. female who has history of diabetes as per patient for the last 4 years, she has not been compliant in taking metformin as per patient it bothers her stomach, she wants to take a one-time dosage, her hemoglobin A1c has trended up, she also reports family history of diabetes, currently her hemoglobin A1c is more than 10%, I have discussed with the patient at this point she probably needs to be on insulin. Patient currently denies any acute symptoms.initially her blood pressure was elevated but repeat manual blood pressure is 130/80.  Past Medical History  Diagnosis Date  . Acid reflux   . Gonorrhea June 2013  . Diabetes mellitus     metformin    Past Surgical History  Procedure Laterality Date  . No past surgeries        Medication List       This list is accurate as of: 03/16/14 12:54 PM.  Always use your most recent med list.               azithromycin 250 MG tablet  Commonly known as:  ZITHROMAX  Take 2 tabs by mouth today then 1 tab by mouth daily for 4 days     glimepiride 2 MG tablet  Commonly known as:  AMARYL  Take 1 tablet (2 mg total) by mouth daily before breakfast.     glucose blood test strip  Use as instructed     Guaifenesin 1200 MG Tb12  Take 1 tablet (1,200 mg total) by mouth 2 (two) times daily.     hyoscyamine 0.125 MG SL tablet  Commonly known as:  LEVSIN/SL  Place 1 tablet (0.125 mg total) under the tongue every 6 (six) hours as needed for cramping.     ibuprofen 800 MG tablet  Commonly known as:  ADVIL,MOTRIN  Take 1 tablet (800 mg total) by mouth every 8 (eight) hours as needed.     Insulin Glargine 100 UNIT/ML Solostar Pen  Commonly known as:  LANTUS  Inject 10 Units into the skin daily at 10 pm.     metFORMIN 750 MG 24 hr tablet  Commonly known as:  GLUCOPHAGE XR  Take 1 tablet (750 mg total) by mouth daily with breakfast.     promethazine 25 MG tablet  Commonly known as:  PHENERGAN  Take 1 tablet (25 mg total) by mouth every 6 (six) hours as needed for nausea or vomiting.        Meds ordered this encounter  Medications  . insulin aspart (novoLOG) injection 10 Units    Sig:   . metFORMIN (GLUCOPHAGE XR) 750 MG 24 hr tablet    Sig: Take 1 tablet (750 mg total) by mouth daily with breakfast.    Dispense:  30 tablet    Refill:  3  . glucose blood test strip    Sig: Use as instructed    Dispense:  100 each    Refill:  12  . Insulin Glargine (LANTUS) 100 UNIT/ML Solostar Pen    Sig: Inject 10 Units into the skin daily at 10 pm.    Dispense:  15 mL    Refill:  3     There is no immunization history on file for this patient.  Family History  Problem Relation Age of Onset  . Diabetes Mother   . Hypertension Father   . Heart disease Father   . Diabetes Maternal Aunt   . Hypertension Maternal Aunt   . Diabetes Maternal Uncle   . Hypertension Maternal Uncle   . Cancer Maternal Uncle   . Hypertension Paternal Aunt   . Hypertension Paternal Uncle     History  Substance Use Topics  . Smoking status: Never Smoker   . Smokeless tobacco: Never Used  . Alcohol Use: No    Review of Systems   As noted in HPI  Filed Vitals:   03/16/14 1122  BP: 130/80  Pulse:   Temp:   Resp:     Physical Exam  Physical Exam  Constitutional: No distress.  Eyes: EOM are normal. Pupils are equal, round, and reactive to light.  Cardiovascular: Normal rate and regular rhythm.   Pulmonary/Chest: Breath sounds normal. No respiratory distress. She has no wheezes. She has no rales.  Abdominal: She exhibits no distension. There is no tenderness.  Musculoskeletal: She exhibits no edema.    CBC    Component Value Date/Time   WBC 5.6 03/13/2014 0620   RBC 4.36 03/13/2014 0620    HGB 10.5* 03/13/2014 0620   HCT 34.0* 03/13/2014 0620   PLT 307 03/13/2014 0620   MCV 78.0 03/13/2014 0620   LYMPHSABS 1.7 03/13/2014 0620   MONOABS 0.4 03/13/2014 0620   EOSABS 0.1 03/13/2014 0620   BASOSABS 0.0 03/13/2014 0620    CMP     Component Value Date/Time   NA 134* 03/13/2014 0620   K 4.3 03/13/2014 0620   CL 101 03/13/2014 0620   CO2 20 03/13/2014 0620   GLUCOSE 325* 03/13/2014 0620   BUN 6 03/13/2014 0620   CREATININE 0.60 03/13/2014 0620   CALCIUM 9.5 03/13/2014 0620   PROT 8.8* 12/10/2013 1250   ALBUMIN 4.1 12/10/2013 1250   AST 17 12/10/2013 1250   ALT 16 12/10/2013 1250   ALKPHOS 90 12/10/2013 1250   BILITOT 0.7 12/10/2013 1250   GFRNONAA >90 03/13/2014 0620   GFRAA >90 03/13/2014 0620    Lab Results  Component Value Date/Time   CHOL 193 10/31/2012 10:45 AM    No components found for: HGA1C  Lab Results  Component Value Date/Time   AST 17 12/10/2013 12:50 PM    Assessment and Plan  Other specified diabetes mellitus without complications - Plan:  Results for orders placed or performed in visit on 03/16/14  Glucose (CBG)  Result Value Ref Range   POC Glucose 355 (A) 70 - 99 mg/dl  HgB A1c  Result Value Ref Range   Hemoglobin A1C 10.20    This point diabetes is uncontrolled, have started patient on Lantus as well as prescribed extended-release metformin 750 mg daily, advised patient to keep the fingerstick log, advised patient for diabetes meal planning, will repeat A1c in 3 months  HgB A1c, insulin aspart (novoLOG) injection 10 Units, metFORMIN (GLUCOPHAGE XR) 750 MG 24 hr tablet, glucose blood test strip, Insulin Glargine (LANTUS) 100 UNIT/ML Solostar Pen  Elevated BP Advise patient for DASH diet.   Return in about 3 months (around 06/16/2014) for diabetes.   This note has been created with Surveyor, quantity. Any transcriptional errors are unintentional.    Lorayne Marek, MD

## 2014-03-16 NOTE — Progress Notes (Signed)
Patient states here for follow up Was recently seen in the ED for URI Patient complains that the metformin upsets her stomach

## 2014-03-16 NOTE — Patient Instructions (Signed)
DASH Eating Plan DASH stands for "Dietary Approaches to Stop Hypertension." The DASH eating plan is a healthy eating plan that has been shown to reduce high blood pressure (hypertension). Additional health benefits may include reducing the risk of type 2 diabetes mellitus, heart disease, and stroke. The DASH eating plan may also help with weight loss. WHAT DO I NEED TO KNOW ABOUT THE DASH EATING PLAN? For the DASH eating plan, you will follow these general guidelines:  Choose foods with a percent daily value for sodium of less than 5% (as listed on the food label).  Use salt-free seasonings or herbs instead of table salt or sea salt.  Check with your health care provider or pharmacist before using salt substitutes.  Eat lower-sodium products, often labeled as "lower sodium" or "no salt added."  Eat fresh foods.  Eat more vegetables, fruits, and low-fat dairy products.  Choose whole grains. Look for the word "whole" as the first word in the ingredient list.  Choose fish and skinless chicken or turkey more often than red meat. Limit fish, poultry, and meat to 6 oz (170 g) each day.  Limit sweets, desserts, sugars, and sugary drinks.  Choose heart-healthy fats.  Limit cheese to 1 oz (28 g) per day.  Eat more home-cooked food and less restaurant, buffet, and fast food.  Limit fried foods.  Cook foods using methods other than frying.  Limit canned vegetables. If you do use them, rinse them well to decrease the sodium.  When eating at a restaurant, ask that your food be prepared with less salt, or no salt if possible. WHAT FOODS CAN I EAT? Seek help from a dietitian for individual calorie needs. Grains Whole grain or whole wheat bread. Brown rice. Whole grain or whole wheat pasta. Quinoa, bulgur, and whole grain cereals. Low-sodium cereals. Corn or whole wheat flour tortillas. Whole grain cornbread. Whole grain crackers. Low-sodium crackers. Vegetables Fresh or frozen vegetables  (raw, steamed, roasted, or grilled). Low-sodium or reduced-sodium tomato and vegetable juices. Low-sodium or reduced-sodium tomato sauce and paste. Low-sodium or reduced-sodium canned vegetables.  Fruits All fresh, canned (in natural juice), or frozen fruits. Meat and Other Protein Products Ground beef (85% or leaner), grass-fed beef, or beef trimmed of fat. Skinless chicken or turkey. Ground chicken or turkey. Pork trimmed of fat. All fish and seafood. Eggs. Dried beans, peas, or lentils. Unsalted nuts and seeds. Unsalted canned beans. Dairy Low-fat dairy products, such as skim or 1% milk, 2% or reduced-fat cheeses, low-fat ricotta or cottage cheese, or plain low-fat yogurt. Low-sodium or reduced-sodium cheeses. Fats and Oils Tub margarines without trans fats. Light or reduced-fat mayonnaise and salad dressings (reduced sodium). Avocado. Safflower, olive, or canola oils. Natural peanut or almond butter. Other Unsalted popcorn and pretzels. The items listed above may not be a complete list of recommended foods or beverages. Contact your dietitian for more options. WHAT FOODS ARE NOT RECOMMENDED? Grains White bread. White pasta. White rice. Refined cornbread. Bagels and croissants. Crackers that contain trans fat. Vegetables Creamed or fried vegetables. Vegetables in a cheese sauce. Regular canned vegetables. Regular canned tomato sauce and paste. Regular tomato and vegetable juices. Fruits Dried fruits. Canned fruit in light or heavy syrup. Fruit juice. Meat and Other Protein Products Fatty cuts of meat. Ribs, chicken wings, bacon, sausage, bologna, salami, chitterlings, fatback, hot dogs, bratwurst, and packaged luncheon meats. Salted nuts and seeds. Canned beans with salt. Dairy Whole or 2% milk, cream, half-and-half, and cream cheese. Whole-fat or sweetened yogurt. Full-fat   cheeses or blue cheese. Nondairy creamers and whipped toppings. Processed cheese, cheese spreads, or cheese  curds. Condiments Onion and garlic salt, seasoned salt, table salt, and sea salt. Canned and packaged gravies. Worcestershire sauce. Tartar sauce. Barbecue sauce. Teriyaki sauce. Soy sauce, including reduced sodium. Steak sauce. Fish sauce. Oyster sauce. Cocktail sauce. Horseradish. Ketchup and mustard. Meat flavorings and tenderizers. Bouillon cubes. Hot sauce. Tabasco sauce. Marinades. Taco seasonings. Relishes. Fats and Oils Butter, stick margarine, lard, shortening, ghee, and bacon fat. Coconut, palm kernel, or palm oils. Regular salad dressings. Other Pickles and olives. Salted popcorn and pretzels. The items listed above may not be a complete list of foods and beverages to avoid. Contact your dietitian for more information. WHERE CAN I FIND MORE INFORMATION? National Heart, Lung, and [employer] Institute: www.nhlbi.nih.gov/health/health-topics/topics/dash/ Document Released: 12/15/2010 Document Revised: 05/12/2013 Document Reviewed: 10/30/2012 Exit[employer] Patient Information 2015 [employer]. This information is not intended to replace advice given to you by your health care provider. Make sure you discuss any questions you have with your health care provider. Diabetes Mellitus and Food It is important for you to manage your blood sugar (glucose) level. Your blood glucose level can be greatly affected by what you eat. Eating healthier foods in the appropriate amounts throughout the day at about the same time each day will help you control your blood glucose level. It can also help slow or prevent worsening of your diabetes mellitus. Healthy eating may even help you improve the level of your blood pressure and reach or maintain a healthy weight.  HOW CAN FOOD AFFECT ME? Carbohydrates Carbohydrates affect your blood glucose level more than any other type of food. Your dietitian will help you determine how many carbohydrates to eat at each meal and teach you how to count carbohydrates. Counting  carbohydrates is important to keep your blood glucose at a healthy level, especially if you are using insulin or taking certain medicines for diabetes mellitus. Alcohol Alcohol can cause sudden decreases in blood glucose (hypoglycemia), especially if you use insulin or take certain medicines for diabetes mellitus. Hypoglycemia can be a life-threatening condition. Symptoms of hypoglycemia (sleepiness, dizziness, and disorientation) are similar to symptoms of having too much alcohol.  If your health care provider has given you approval to drink alcohol, do so in moderation and use the following guidelines:  Women should not have more than one drink per day, and men should not have more than two drinks per day. One drink is equal to:  12 oz of beer.  5 oz of wine.  1 oz of hard liquor.  Do not drink on an empty stomach.  Keep yourself hydrated. Have water, diet soda, or unsweetened iced tea.  Regular soda, juice, and other mixers might contain a lot of carbohydrates and should be counted. WHAT FOODS ARE NOT RECOMMENDED? As you make food choices, it is important to remember that all foods are not the same. Some foods have fewer nutrients per serving than other foods, even though they might have the same number of calories or carbohydrates. It is difficult to get your body what it needs when you eat foods with fewer nutrients. Examples of foods that you should avoid that are high in calories and carbohydrates but low in nutrients include:  Trans fats (most processed foods list trans fats on the Nutrition Facts label).  Regular soda.  Juice.  Candy.  Sweets, such as cake, pie, doughnuts, and cookies.  Fried foods. WHAT FOODS CAN I EAT? Have nutrient-rich foods,   which will nourish your body and keep you healthy. The food you should eat also will depend on several factors, including:  The calories you need.  The medicines you take.  Your weight.  Your blood glucose level.  Your  blood pressure level.  Your cholesterol level. You also should eat a variety of foods, including:  Protein, such as meat, poultry, fish, tofu, nuts, and seeds (lean animal proteins are best).  Fruits.  Vegetables.  Dairy products, such as milk, cheese, and yogurt (low fat is best).  Breads, grains, pasta, cereal, rice, and beans.  Fats such as olive oil, trans fat-free margarine, canola oil, avocado, and olives. DOES EVERYONE WITH DIABETES MELLITUS HAVE THE SAME MEAL PLAN? Because every person with diabetes mellitus is different, there is not one meal plan that works for everyone. It is very important that you meet with a dietitian who will help you create a meal plan that is just right for you. Document Released: 09/22/2004 Document Revised: 12/31/2012 Document Reviewed: 11/22/2012 Exit[employer] Patient Information 2015 [employer]. This information is not intended to replace advice given to you by your health care provider. Make sure you discuss any questions you have with your health care provider.  

## 2014-04-09 ENCOUNTER — Ambulatory Visit: Payer: Self-pay

## 2014-05-04 ENCOUNTER — Encounter (HOSPITAL_COMMUNITY): Payer: Self-pay | Admitting: Emergency Medicine

## 2014-05-04 ENCOUNTER — Emergency Department (HOSPITAL_COMMUNITY)
Admission: EM | Admit: 2014-05-04 | Discharge: 2014-05-04 | Disposition: A | Discharge status: Home / Self Care | Payer: Self-pay | Attending: Emergency Medicine | Admitting: Emergency Medicine

## 2014-05-04 ENCOUNTER — Emergency Department (HOSPITAL_COMMUNITY): Payer: Self-pay

## 2014-05-04 DIAGNOSIS — Z8719 Personal history of other diseases of the digestive system: Secondary | ICD-10-CM | POA: Insufficient documentation

## 2014-05-04 DIAGNOSIS — Z794 Long term (current) use of insulin: Secondary | ICD-10-CM | POA: Insufficient documentation

## 2014-05-04 DIAGNOSIS — Z79899 Other long term (current) drug therapy: Secondary | ICD-10-CM | POA: Insufficient documentation

## 2014-05-04 DIAGNOSIS — M25561 Pain in right knee: Secondary | ICD-10-CM | POA: Insufficient documentation

## 2014-05-04 DIAGNOSIS — Z8619 Personal history of other infectious and parasitic diseases: Secondary | ICD-10-CM | POA: Insufficient documentation

## 2014-05-04 DIAGNOSIS — E119 Type 2 diabetes mellitus without complications: Secondary | ICD-10-CM | POA: Insufficient documentation

## 2014-05-04 MED ORDER — NAPROXEN 500 MG PO TABS: 500.0000 mg | ORAL_TABLET | Freq: Two times a day (BID) | ORAL | Status: DC

## 2014-05-04 NOTE — ED Notes (Signed)
Pt reports R medial knee pain, reports sudden onset yesterday.  NAD noted at this time.  Knee tender to palpation.

## 2014-05-04 NOTE — ED Provider Notes (Signed)
CSN: [id number]     Arrival date & time 05/04/14  0509 History   First MD Initiated Contact with Patient 05/04/14 [phone]     Chief Complaint  Patient presents with  . Knee Pain     (Consider location/radiation/quality/duration/timing/severity/associated sxs/prior Treatment) HPI Comments: Patient presents today with a chief complaint of right knee pain.  She states that she began having the pain last evening.  She denies any acute injury or trauma. She describes the pain as a nagging pain, but reports that the pain occasionally becomes sharp.  Pain worse along the medial aspect.  She reports that she has not taken anything for pain prior to arrival.   No swelling or warmth of the right knee.  She denies fever, chills, numbness, or tingling.  No prior knee injuries.    Patient is a 24 y.o. female presenting with knee pain. The history is provided by the patient.  Knee Pain   Past Medical History  Diagnosis Date  . Acid reflux   . Gonorrhea June 2013  . Diabetes mellitus     metformin   Past Surgical History  Procedure Laterality Date  . No past surgeries     Family History  Problem Relation Age of Onset  . Diabetes Mother   . Hypertension Father   . Heart disease Father   . Diabetes Maternal Aunt   . Hypertension Maternal Aunt   . Diabetes Maternal Uncle   . Hypertension Maternal Uncle   . Cancer Maternal Uncle   . Hypertension Paternal Aunt   . Hypertension Paternal Uncle    History  Substance Use Topics  . Smoking status: Never Smoker   . Smokeless tobacco: Never Used  . Alcohol Use: No   OB History    Gravida Para Term Preterm AB TAB SAB Ectopic Multiple Living   1 1       1  0     Review of Systems  All other systems reviewed and are negative.     Allergies  Review of patient's allergies indicates no known allergies.  Home Medications   Prior to Admission medications   Medication Sig Start Date End Date Taking? Authorizing Provider  Insulin Glargine  (LANTUS) 100 UNIT/ML Solostar Pen Inject 10 Units into the skin daily at 10 pm. 03/16/14  Yes Lorayne Marek, MD  metFORMIN (GLUCOPHAGE XR) 750 MG 24 hr tablet Take 1 tablet (750 mg total) by mouth daily with breakfast. 03/16/14  Yes Lorayne Marek, MD  azithromycin (ZITHROMAX) 250 MG tablet Take 2 tabs by mouth today then 1 tab by mouth daily for 4 days Patient not taking: Reported on 03/13/2014 02/13/14   Lorayne Marek, MD  glimepiride (AMARYL) 2 MG tablet Take 1 tablet (2 mg total) by mouth daily before breakfast. Patient not taking: Reported on 11/27/2013 03/14/13   Debbe Odea, MD  glucose blood test strip Use as instructed 03/16/14   Lorayne Marek, MD  Guaifenesin 1200 MG TB12 Take 1 tablet (1,200 mg total) by mouth 2 (two) times daily. Patient not taking: Reported on 05/04/2014 03/13/14   Dalia Heading, PA-C  hyoscyamine (LEVSIN/SL) 0.125 MG SL tablet Place 1 tablet (0.125 mg total) under the tongue every 6 (six) hours as needed for cramping. Patient not taking: Reported on 02/13/2014 12/10/13   Quintella Reichert, MD  ibuprofen (ADVIL,MOTRIN) 800 MG tablet Take 1 tablet (800 mg total) by mouth every 8 (eight) hours as needed. Patient not taking: Reported on 05/04/2014 03/13/14   Dalia Heading, PA-C  promethazine (PHENERGAN) 25 MG tablet Take 1 tablet (25 mg total) by mouth every 6 (six) hours as needed for nausea or vomiting. Patient not taking: Reported on 02/13/2014 12/10/13   Quintella Reichert, MD   BP 123/88 mmHg  Pulse 79  Temp(Src) 98 F (36.7 C) (Oral)  Resp 18  Ht 5\' 6"  (1.676 m)  Wt 222 lb (100.699 kg)  BMI 35.85 kg/m2  SpO2 97%  LMP 04/15/2014 Physical Exam  Constitutional: She appears well-developed and well-nourished.  HENT:  Head: Normocephalic and atraumatic.  Neck: Normal range of motion. Neck supple.  Cardiovascular: Normal rate, regular rhythm and normal heart sounds.   Pulses:      Dorsalis pedis pulses are 2+ on the right side.  Pulmonary/Chest: Effort normal and breath sounds  normal.  Musculoskeletal:       Right knee: She exhibits normal range of motion and no effusion. Tenderness found. Medial joint line tenderness noted.  Full ROM of the right knee No erythema or warmth of the right knee  Neurological: She is alert.  Distal sensation of the right foot intact  Skin: Skin is warm and dry.  Psychiatric: She has a normal mood and affect.  Nursing note and vitals reviewed.   ED Course  Procedures (including critical care time) Labs Review Labs Reviewed - No data to display  Imaging Review Dg Knee Complete 4 Views Right  05/04/2014   CLINICAL DATA:  Medial right knee pain beginning 05/03/2014. No known injury. Initial encounter.  EXAM: RIGHT KNEE - COMPLETE 4+ VIEW  COMPARISON:  None.  FINDINGS: There is no evidence of fracture, dislocation, or joint effusion. There is no evidence of arthropathy or other focal bone abnormality. Soft tissues are unremarkable.  IMPRESSION: Normal exam.   Electronically Signed   By: Inge Rise M.D.   On: 05/04/2014 07:16     EKG Interpretation None      MDM   Final diagnoses:  None   Patient presents today with right knee pain.  No acute injury or trauma.  Xray is negative.  No signs of infection.  Patient neurovascularly intact.  Patient given knee sleeve and discharged home with Rx for Naproxen.  Return precautions given.    Hyman Bible, PA-C 05/04/14 1552  Julianne Rice, MD 05/07/14 [phone]

## 2014-06-26 IMAGING — US US TRANSVAGINAL NON-OB
1 series · 13 of 25 positions shown · non-contrast
Comparison: CT ABD/PELVIS W CM dated 03/18/2013

CLINICAL DATA: Abnormal CT. Abnormal area in the region of the
cervix and lower uterine segment.

EXAM:
TRANSABDOMINAL AND TRANSVAGINAL ULTRASOUND OF PELVIS
TECHNIQUE: Both transabdominal and transvaginal ultrasound examinations of the
pelvis were performed. Transabdominal technique was performed for
global imaging of the pelvis including uterus, ovaries, adnexal
regions, and pelvic cul-de-sac. It was necessary to proceed with
endovaginal exam following the transabdominal exam to visualize the
uterus, endometrium, ovaries and adnexa .

[Series 1: us transvaginal non-ob · 0.24mm/px · 13 of 70 slices shown]
[im 1/70]
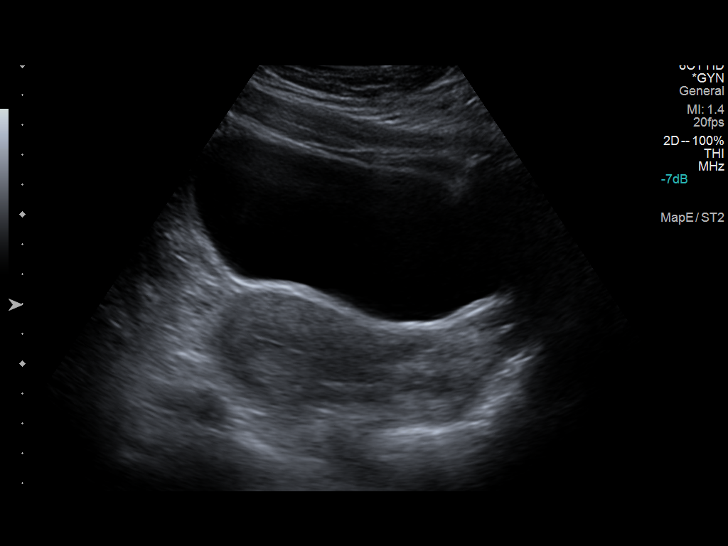
[im 6/70]
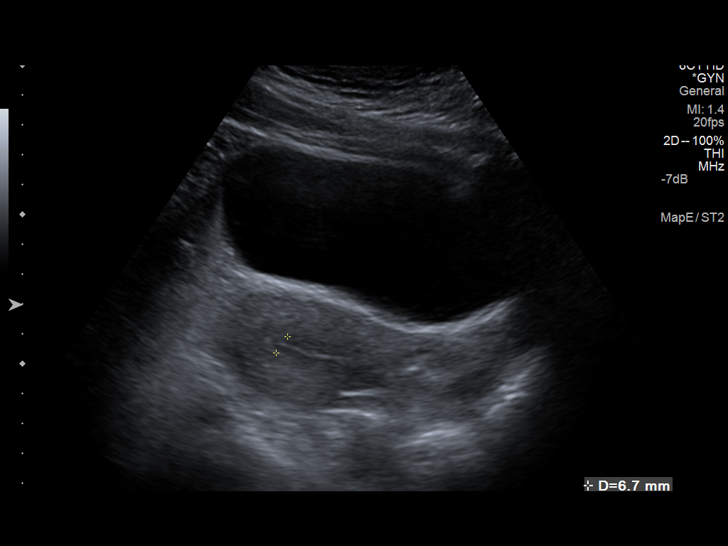
[im 12/70]
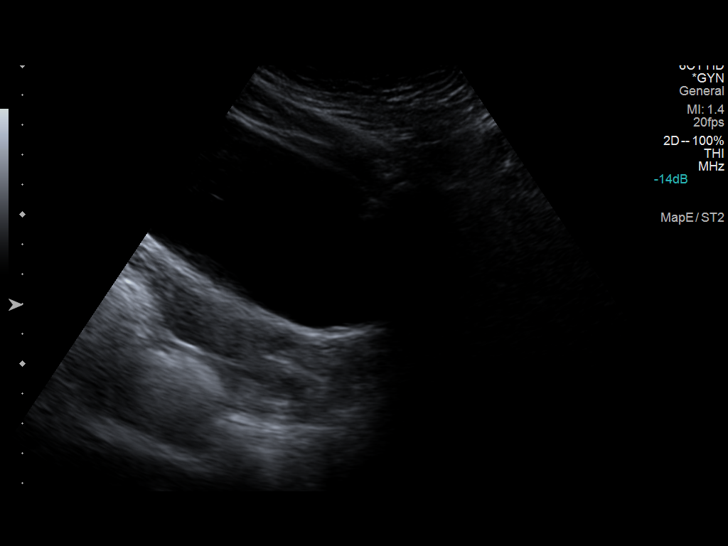
[im 18/70]
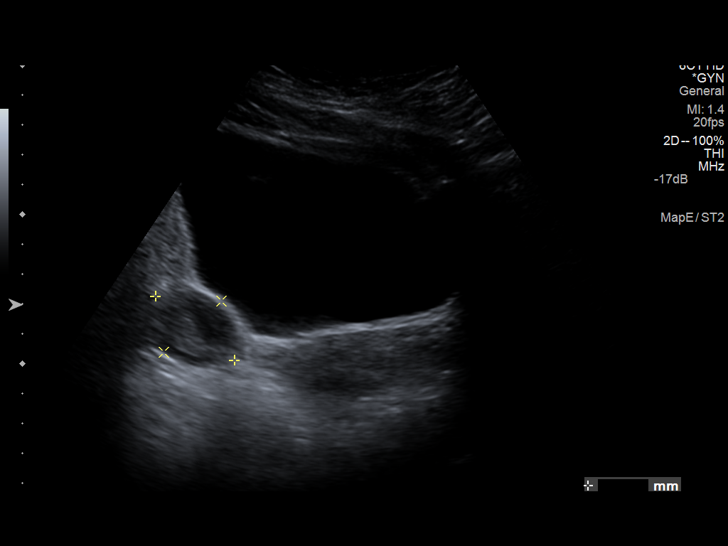
[im 24/70]
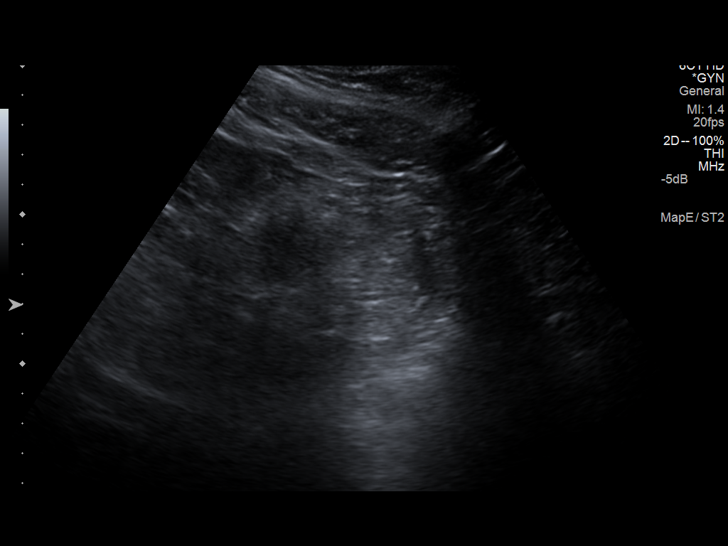
[im 29/70]
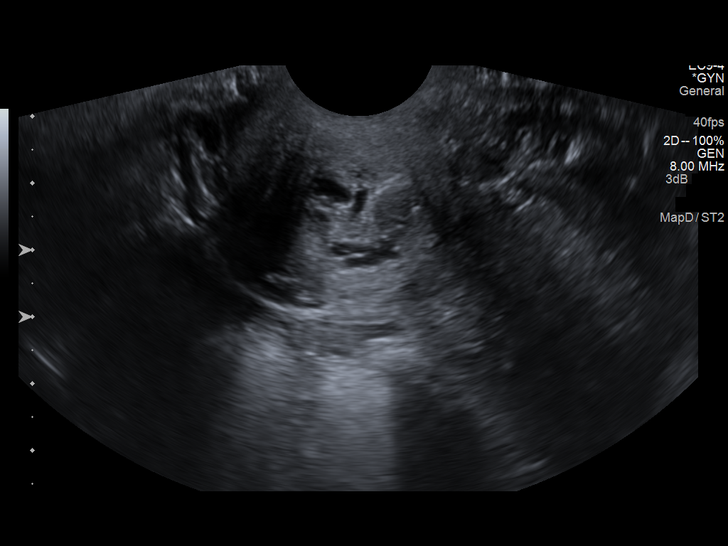
[im 35/70]
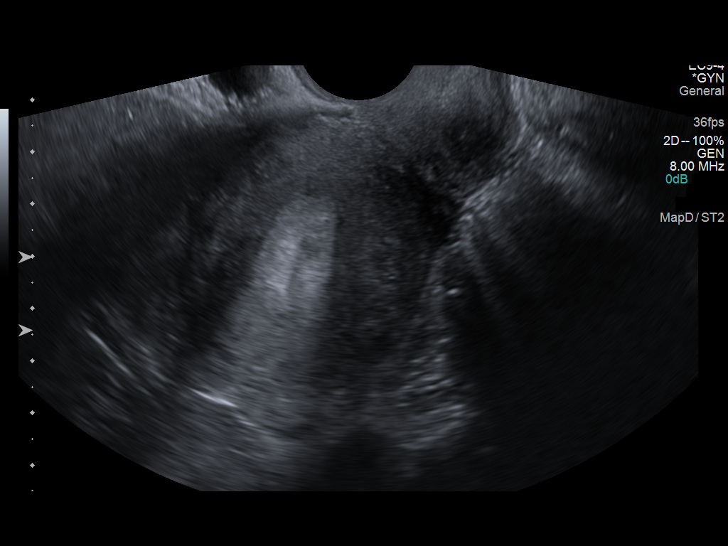
[im 41/70]
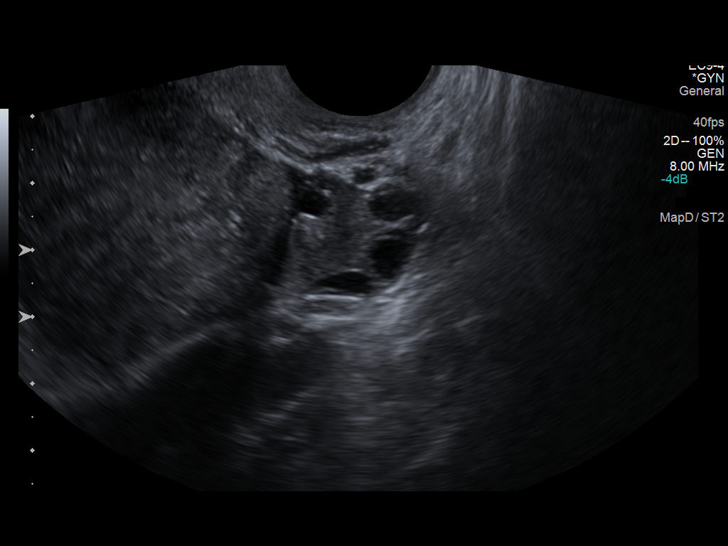
[im 47/70]
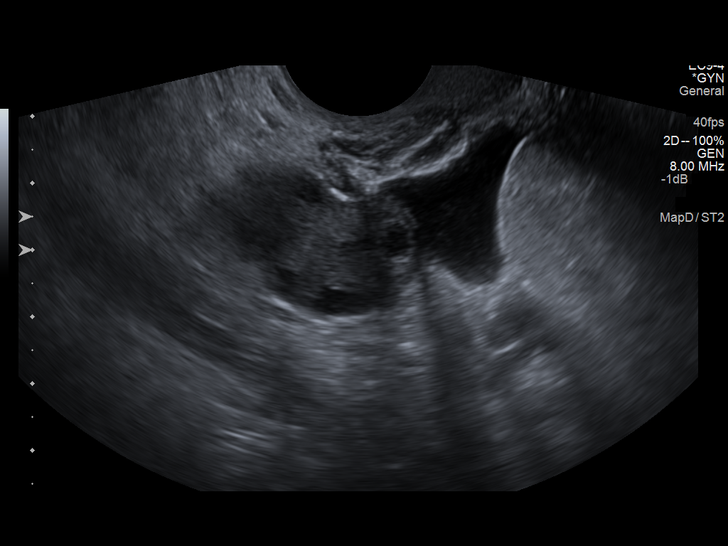
[im 52/70]
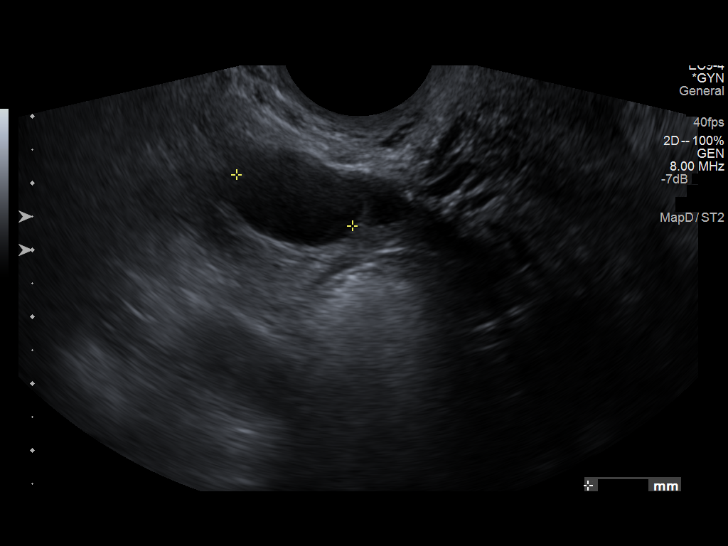
[im 58/70]
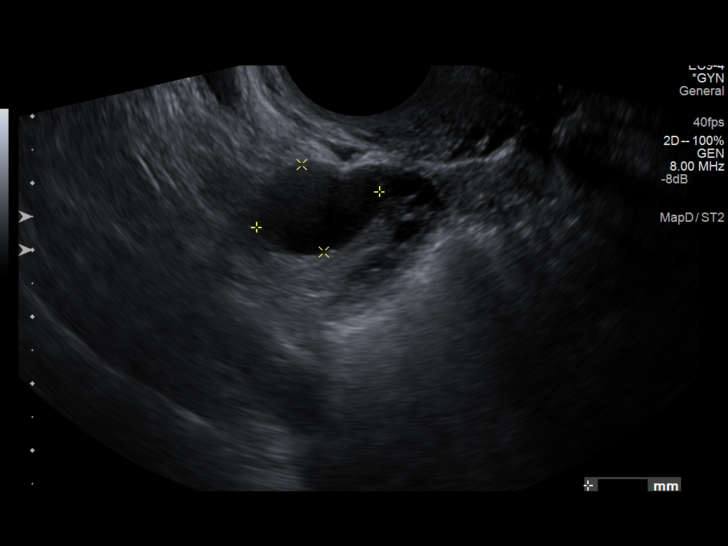
[im 64/70]
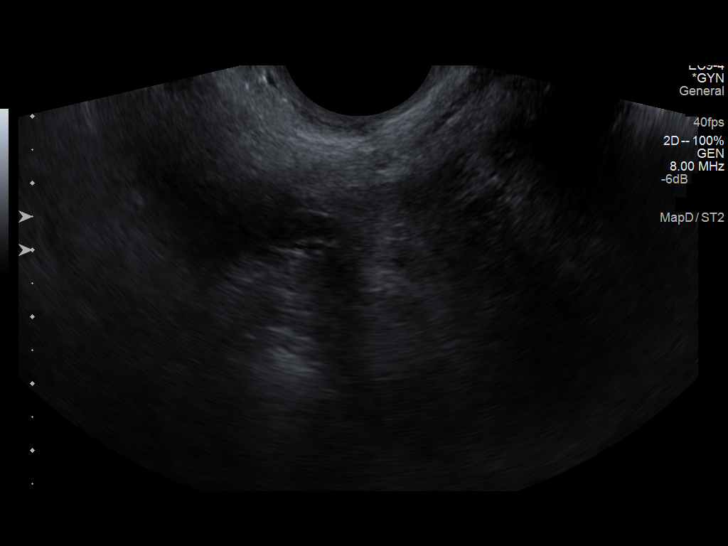
[im 70/70]
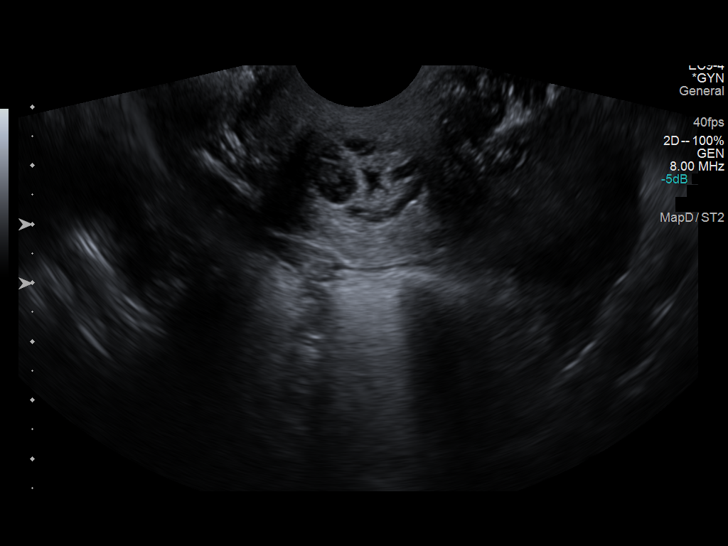

[13 of 25 positions shown; findings below may reference images not displayed]

FINDINGS: Uterus

Measurements: 8.7 x 4.6 x 4.9 cm. Small nabothian cysts in the
cervix. This could conceivably be the cause of the hypo attenuating
area seen on CT. No other cervical or lower uterine segment
abnormality noted.

Endometrium

Thickness: 17 mm in thickness.  No focal abnormality visualized.

Right ovary

Measurements: 3.0 x 2.7 x 3.4 cm. Dominant 1.9 cm follicle. Normal
appearance. No adnexal mass.

Left ovary

Measurements: 3.2 x 1.6 x 1.9 cm. 1.8 cm follicle or resolving cyst.
Normal appearance. No adnexal mass.

Other findings

Small amount of free fluid in the pelvis.
IMPRESSION: Nabothian cysts within the cervix. This may E be the cause of the
low-density area seen on CT. No other cervical or lower uterine
segment abnormality.

## 2014-08-27 ENCOUNTER — Emergency Department (HOSPITAL_COMMUNITY)
Admission: EM | Admit: 2014-08-27 | Discharge: 2014-08-27 | Disposition: A | Discharge status: Home / Self Care | Payer: Self-pay | Attending: Emergency Medicine | Admitting: Emergency Medicine

## 2014-08-27 ENCOUNTER — Encounter (HOSPITAL_COMMUNITY): Payer: Self-pay | Admitting: Cardiology

## 2014-08-27 DIAGNOSIS — R110 Nausea: Secondary | ICD-10-CM | POA: Insufficient documentation

## 2014-08-27 DIAGNOSIS — Z794 Long term (current) use of insulin: Secondary | ICD-10-CM | POA: Insufficient documentation

## 2014-08-27 DIAGNOSIS — Z8619 Personal history of other infectious and parasitic diseases: Secondary | ICD-10-CM | POA: Insufficient documentation

## 2014-08-27 DIAGNOSIS — Z79899 Other long term (current) drug therapy: Secondary | ICD-10-CM | POA: Insufficient documentation

## 2014-08-27 DIAGNOSIS — E119 Type 2 diabetes mellitus without complications: Secondary | ICD-10-CM | POA: Insufficient documentation

## 2014-08-27 DIAGNOSIS — R300 Dysuria: Secondary | ICD-10-CM | POA: Insufficient documentation

## 2014-08-27 DIAGNOSIS — Z3202 Encounter for pregnancy test, result negative: Secondary | ICD-10-CM | POA: Insufficient documentation

## 2014-08-27 DIAGNOSIS — Z8719 Personal history of other diseases of the digestive system: Secondary | ICD-10-CM | POA: Insufficient documentation

## 2014-08-27 LAB — URINALYSIS, ROUTINE W REFLEX MICROSCOPIC
Bilirubin Urine: NEGATIVE
Glucose, UA: >1000 mg/dL — AB
Ketones, ur: 15 mg/dL — AB
NITRITE: NEGATIVE
Protein, ur: 30 mg/dL — AB
SPECIFIC GRAVITY, URINE: 1.036 — AB (ref 1.005–1.030)
UROBILINOGEN UA: 1 mg/dL (ref 0.0–1.0)
pH: 5.5 (ref 5.0–8.0)

## 2014-08-27 LAB — URINE MICROSCOPIC-ADD ON

## 2014-08-27 LAB — CBG MONITORING, ED: GLUCOSE-CAPILLARY: 162 mg/dL — AB (ref 65–99)

## 2014-08-27 LAB — WET PREP, GENITAL
CLUE CELLS WET PREP: NONE SEEN
TRICH WET PREP: NONE SEEN
Yeast Wet Prep HPF POC: NONE SEEN

## 2014-08-27 LAB — PREGNANCY, URINE: PREG TEST UR: NEGATIVE

## 2014-08-27 MED ORDER — CEFTRIAXONE SODIUM 250 MG IJ SOLR
250.0000 mg | Freq: Once | INTRAMUSCULAR | Status: AC
  Administered 2014-08-27: 250 mg via INTRAMUSCULAR
  Filled 2014-08-27: qty 250

## 2014-08-27 MED ORDER — LIDOCAINE HCL (PF) 1 % IJ SOLN
INTRAMUSCULAR | Status: AC
  Administered 2014-08-27: 0.9 mL
  Filled 2014-08-27: qty 5

## 2014-08-27 MED ORDER — AZITHROMYCIN 250 MG PO TABS
1000.0000 mg | ORAL_TABLET | Freq: Once | ORAL | Status: AC
  Administered 2014-08-27: 1000 mg via ORAL
  Filled 2014-08-27: qty 4

## 2014-08-27 NOTE — ED Notes (Signed)
Pt reports over the past week she has had dysuria and lower back pain. Denies any blood in her urine, no n/v.

## 2014-08-27 NOTE — ED Provider Notes (Signed)
CSN: [id number]     Arrival date & time 08/27/14  1045 History   First MD Initiated Contact with Patient 08/27/14 1141     Chief Complaint  Patient presents with  . Dysuria     (Consider location/radiation/quality/duration/timing/severity/associated sxs/prior Treatment) HPI Comments: Pt reports onset of dysuria "for a while" that pt self-medicated with monistat thinking it was a yeast infection. Pt seen at [facility] after syncopal episode at church and chest pain with reports of elevated ketones in urine but otherwise normal exam. No further syncope or CP since then. Pt received no abx, reports cbg was 349. Pt reports irritation to perineal area, reports urinary frequency.        Patient is a 24 y.o. female presenting with dysuria. The history is provided by the patient.  Dysuria Pain quality:  Burning Pain severity:  Moderate Onset quality:  Unable to specify Timing:  Constant Progression:  Worsening Chronicity:  Recurrent Relieved by:  Nothing Worsened by:  Nothing tried Ineffective treatments:  None tried Urinary symptoms: frequent urination and hesitancy   Associated symptoms: nausea   Associated symptoms: no abdominal pain, no fever, no flank pain and no vomiting   Risk factors: sexually active   Risk factors: no kidney transplant     Past Medical History  Diagnosis Date  . Acid reflux   . Gonorrhea June 2013  . Diabetes mellitus     metformin   Past Surgical History  Procedure Laterality Date  . No past surgeries     Family History  Problem Relation Age of Onset  . Diabetes Mother   . Hypertension Father   . Heart disease Father   . Diabetes Maternal Aunt   . Hypertension Maternal Aunt   . Diabetes Maternal Uncle   . Hypertension Maternal Uncle   . Cancer Maternal Uncle   . Hypertension Paternal Aunt   . Hypertension Paternal Uncle    Social History  Substance Use Topics  . Smoking status: Never Smoker   . Smokeless tobacco: Never Used  . Alcohol  Use: No   OB History    Gravida Para Term Preterm AB TAB SAB Ectopic Multiple Living   1 1       1  0     Review of Systems  Constitutional: Negative for fever.  Gastrointestinal: Positive for nausea. Negative for vomiting and abdominal pain.  Genitourinary: Positive for dysuria. Negative for flank pain.  All other systems reviewed and are negative.     Allergies  Review of patient's allergies indicates no known allergies.  Home Medications   Prior to Admission medications   Medication Sig Start Date End Date Taking? Authorizing Provider  Insulin Glargine (LANTUS) 100 UNIT/ML Solostar Pen Inject 10 Units into the skin daily at 10 pm. 03/16/14  Yes Lorayne Marek, MD  metFORMIN (GLUCOPHAGE XR) 750 MG 24 hr tablet Take 1 tablet (750 mg total) by mouth daily with breakfast. 03/16/14  Yes Lorayne Marek, MD  azithromycin (ZITHROMAX) 250 MG tablet Take 2 tabs by mouth today then 1 tab by mouth daily for 4 days Patient not taking: Reported on 03/13/2014 02/13/14   Lorayne Marek, MD  glimepiride (AMARYL) 2 MG tablet Take 1 tablet (2 mg total) by mouth daily before breakfast. Patient not taking: Reported on 11/27/2013 03/14/13   Debbe Odea, MD  glucose blood test strip Use as instructed 03/16/14   Lorayne Marek, MD  Guaifenesin 1200 MG TB12 Take 1 tablet (1,200 mg total) by mouth 2 (two) times daily.  Patient not taking: Reported on 05/04/2014 03/13/14   Dalia Heading, PA-C  hyoscyamine (LEVSIN/SL) 0.125 MG SL tablet Place 1 tablet (0.125 mg total) under the tongue every 6 (six) hours as needed for cramping. Patient not taking: Reported on 02/13/2014 12/10/13   Quintella Reichert, MD  ibuprofen (ADVIL,MOTRIN) 800 MG tablet Take 1 tablet (800 mg total) by mouth every 8 (eight) hours as needed. Patient not taking: Reported on 05/04/2014 03/13/14   Dalia Heading, PA-C  naproxen (NAPROSYN) 500 MG tablet Take 1 tablet (500 mg total) by mouth 2 (two) times daily. Patient not taking: Reported on 08/27/2014  05/04/14   Hyman Bible, PA-C  promethazine (PHENERGAN) 25 MG tablet Take 1 tablet (25 mg total) by mouth every 6 (six) hours as needed for nausea or vomiting. Patient not taking: Reported on 02/13/2014 12/10/13   Quintella Reichert, MD   BP 126/78 mmHg  Pulse 71  Temp(Src) 98 F (36.7 C) (Oral)  Resp 18  Ht 5\' 6"  (1.676 m)  Wt 220 lb (99.791 kg)  BMI 35.53 kg/m2  SpO2 98%  LMP 08/10/2014 Physical Exam  Constitutional: She is oriented to person, place, and time. She appears well-developed and well-nourished. No distress.  HENT:  Head: Normocephalic and atraumatic.  Right Ear: External ear normal.  Left Ear: External ear normal.  Nose: Nose normal.  Mouth/Throat: Oropharynx is clear and moist.  Eyes: Conjunctivae are normal.  Neck: Normal range of motion. Neck supple.  No nuchal rigidity.   Cardiovascular: Normal rate, regular rhythm and normal heart sounds.   Pulmonary/Chest: Effort normal and breath sounds normal.  Abdominal: Soft. Bowel sounds are normal. There is no tenderness.  Musculoskeletal: Normal range of motion.  Neurological: She is alert and oriented to person, place, and time.  Skin: Skin is warm and dry. She is not diaphoretic.  Psychiatric: She has a normal mood and affect.  Nursing note and vitals reviewed.    Exam performed by Baron Sane L,  exam chaperoned Date: 08/27/2014 Pelvic exam: normal external genitalia without evidence of trauma. VULVA: normal appearing vulva with no masses, tenderness or lesion. VAGINA: normal appearing vagina with normal color and discharge, no lesions. CERVIX: normal appearing cervix without lesions, cervical motion tenderness absent, cervical os closed with out purulent discharge; vaginal discharge - white, Wet prep and DNA probe for chlamydia and GC obtained.   ADNEXA: normal adnexa in size, nontender and no masses UTERUS: uterus is normal size, shape, consistency and nontender.    ED Course  Procedures (including  critical care time) Medications  azithromycin (ZITHROMAX) tablet 1,000 mg (1,000 mg Oral Given 08/27/14 1459)  cefTRIAXone (ROCEPHIN) injection 250 mg (250 mg Intramuscular Given 08/27/14 1500)  lidocaine (PF) (XYLOCAINE) 1 % injection (0.9 mLs  Given 08/27/14 1500)    Labs Review Labs Reviewed  WET PREP, GENITAL - Abnormal; Notable for the following:    WBC, Wet Prep HPF POC MODERATE (*)    All other components within normal limits  URINALYSIS, ROUTINE W REFLEX MICROSCOPIC (NOT AT [facility]) - Abnormal; Notable for the following:    APPearance HAZY (*)    Specific Gravity, Urine 1.036 (*)    Glucose, UA >1000 (*)    Hgb urine dipstick TRACE (*)    Ketones, ur 15 (*)    Protein, ur 30 (*)    Leukocytes, UA SMALL (*)    All other components within normal limits  URINE MICROSCOPIC-ADD ON - Abnormal; Notable for the following:    Squamous Epithelial / LPF  MANY (*)    Bacteria, UA FEW (*)    All other components within normal limits  CBG MONITORING, ED - Abnormal; Notable for the following:    Glucose-Capillary 162 (*)    All other components within normal limits  PREGNANCY, URINE  RPR  GC/CHLAMYDIA PROBE AMP ([place]) NOT AT [facility]    Imaging Review No results found. I have personally reviewed and evaluated these images and lab results as part of my medical decision-making.   EKG Interpretation None      MDM   Final diagnoses:  Dysuria    Filed Vitals:   08/27/14 1525  BP:   Pulse:   Temp: 98 F (36.7 C)  Resp:    Afebrile, NAD, non-toxic appearing, AAOx4.    Patient to be discharged with instructions to follow up with OBGYN. Pt understands GC/Chlamydia cultures pending and that they will need to inform all sexual partners within the last 6 months if results return positive. Pt has been treated prophylacticly with azithromycin and rocephin due to pts history, pelvic exam, and wet prep with increased WBCs. Pt advised that she will receive a call in 48 hours if the  test is positive and to refrain from sexual activity for 48 hours. If the test is positive, pt is advised to refrain from sexual activity for 10 days for the medicine to take effect.  Pt not concerning for PID because hemodynamically stable and no cervical motion tenderness on pelvic exam. Glucose noted to be 162, few ketones and over thousand glucose in urine. No history of vomiting or diarrhea. Discussed that her glycemic control at home for patient. Advised PCP follow-up to discuss the insulin regimen for better glucose control. Return precautions discussed. Patient is agreeable to plan and stable at time of discharge.        Baron Sane, PA-C 08/27/14 [place], MD 08/28/14 [phone]

## 2014-08-27 NOTE — ED Notes (Signed)
Pt reports onset of dysuria "for a while" that pt self-medicated with monistat thinking it was a yeast infection.  Pt seen at [facility] after syncopal episode at church and chest pain with reports of elevated ketones in urine but otherwise normal exam. Pt received no abx, reports cbg was 349.  Pt reports irritation to perineal area, reports urinary frequency.

## 2014-08-27 NOTE — Discharge Instructions (Signed)
Please refrain from sexual activity for the next 48 hours. If you receive a call informing you of a positive test result please wait 10 days to allow antibiotics to clear your infection. You must notify all partners within the last six months of any positive test results. Please follow up with an Ob/Gyn to schedule a follow up appointment. Please follow up with your primary care physician in 1-2 days. If you do not have one please call the [place] number listed above.Please read all discharge instructions and return precautions.    Dysuria Dysuria is the medical term for pain with urination. There are many causes for dysuria, but urinary tract infection is the most common. If a urinalysis was performed it can show that there is a urinary tract infection. A urine culture confirms that you or your child is sick. You will need to follow up with a healthcare provider because:  If a urine culture was done you will need to know the culture results and treatment recommendations.  If the urine culture was positive, you or your child will need to be put on antibiotics or know if the antibiotics prescribed are the right antibiotics for your urinary tract infection.  If the urine culture is negative (no urinary tract infection), then other causes may need to be explored or antibiotics need to be stopped. Today laboratory work may have been done and there does not seem to be an infection. If cultures were done they will take at least 24 to 48 hours to be completed. Today x-rays may have been taken and they read as normal. No cause can be found for the problems. The x-rays may be re-read by a radiologist and you will be contacted if additional findings are made. You or your child may have been put on medications to help with this problem until you can see your primary caregiver. If the problems get better, see your primary caregiver if the problems return. If you were given antibiotics  (medications which kill germs), take all of the mediations as directed for the full course of treatment.  If laboratory work was done, you need to find the results. Leave a telephone number where you can be reached. If this is not possible, make sure you find out how you are to get test results. HOME CARE INSTRUCTIONS   Drink lots of fluids. For adults, drink eight, 8 ounce glasses of clear juice or water a day. For children, replace fluids as suggested by your caregiver.  Empty the bladder often. Avoid holding urine for long periods of time.  After a bowel movement, women should cleanse front to back, using each tissue only once.  Empty your bladder before and after sexual intercourse.  Take all the medicine given to you until it is gone. You may feel better in a few days, but TAKE ALL MEDICINE.  Avoid caffeine, tea, alcohol and carbonated beverages, because they tend to irritate the bladder.  In men, alcohol may irritate the prostate.  Only take over-the-counter or prescription medicines for pain, discomfort, or fever as directed by your caregiver.  If your caregiver has given you a follow-up appointment, it is very important to keep that appointment. Not keeping the appointment could result in a chronic or permanent injury, pain, and disability. If there is any problem keeping the appointment, you must call back to this facility for assistance. SEEK IMMEDIATE MEDICAL CARE IF:   Back pain develops.  A fever develops.  There  is nausea (feeling sick to your stomach) or vomiting (throwing up).  Problems are no better with medications or are getting worse. MAKE SURE YOU:   Understand these instructions.  Will watch your condition.  Will get help right away if you are not doing well or get worse. Document Released: 09/24/2003 Document Revised: 03/20/2011 Document Reviewed: 08/01/2007 [facility] Patient Information 2015 [place], [place]. This information is not intended to replace  advice given to you by your health care provider. Make sure you discuss any questions you have with your health care provider.

## 2014-08-28 LAB — GC/CHLAMYDIA PROBE AMP (~~LOC~~) NOT AT ARMC
Chlamydia: NEGATIVE
Neisseria Gonorrhea: NEGATIVE

## 2014-08-28 LAB — RPR: RPR: NONREACTIVE

## 2014-09-03 ENCOUNTER — Encounter: Payer: Self-pay | Admitting: Physician Assistant

## 2014-09-03 ENCOUNTER — Ambulatory Visit: Payer: Self-pay | Attending: Physician Assistant | Admitting: Physician Assistant

## 2014-09-03 ENCOUNTER — Other Ambulatory Visit: Payer: Self-pay | Admitting: Pharmacist

## 2014-09-03 VITALS — BP 130/77 | HR 84 | Temp 98.3°F | Resp 17 | Ht 66.0 in | Wt 213.2 lb

## 2014-09-03 DIAGNOSIS — Z794 Long term (current) use of insulin: Secondary | ICD-10-CM | POA: Insufficient documentation

## 2014-09-03 DIAGNOSIS — E1165 Type 2 diabetes mellitus with hyperglycemia: Secondary | ICD-10-CM

## 2014-09-03 DIAGNOSIS — Z9119 Patient's noncompliance with other medical treatment and regimen: Secondary | ICD-10-CM | POA: Insufficient documentation

## 2014-09-03 DIAGNOSIS — E119 Type 2 diabetes mellitus without complications: Secondary | ICD-10-CM | POA: Insufficient documentation

## 2014-09-03 DIAGNOSIS — E139 Other specified diabetes mellitus without complications: Secondary | ICD-10-CM

## 2014-09-03 DIAGNOSIS — R300 Dysuria: Secondary | ICD-10-CM | POA: Insufficient documentation

## 2014-09-03 DIAGNOSIS — Z79899 Other long term (current) drug therapy: Secondary | ICD-10-CM | POA: Insufficient documentation

## 2014-09-03 LAB — POCT CBG (FASTING - GLUCOSE)-MANUAL ENTRY: GLUCOSE FASTING, POC: 213 mg/dL — AB (ref 70–99)

## 2014-09-03 LAB — POCT GLYCOSYLATED HEMOGLOBIN (HGB A1C): Hemoglobin A1C: 12.3

## 2014-09-03 MED ORDER — METFORMIN HCL ER 750 MG PO TB24: 750.0000 mg | ORAL_TABLET | Freq: Every day | ORAL | Status: DC

## 2014-09-03 MED ORDER — GLUCOSE BLOOD VI STRP: ORAL_STRIP | Status: DC

## 2014-09-03 MED ORDER — INSULIN GLARGINE 100 UNIT/ML SOLOSTAR PEN: 10.0000 [IU] | PEN_INJECTOR | Freq: Every day | SUBCUTANEOUS | Status: DC

## 2014-09-03 MED ORDER — TRUEPLUS LANCETS 28G MISC
Status: DC
Start: 2014-09-03 — End: 2017-02-06

## 2014-09-03 MED ORDER — TRUE METRIX METER W/DEVICE KIT: PACK | Status: DC

## 2014-09-03 NOTE — Progress Notes (Addendum)
Patient presents as HFU for T2DM Ran out of metformin 3 months ago Taking lantus 10 units approx twice weekly due to inability to afford  Lab Results  Component Value Date   HGBA1C 10.20 03/16/2014   Filed Vitals:   09/03/14 1319  BP: 130/77  Pulse: 84  Temp: 98.3 F (36.8 C)  Resp: 17    Patient given literature on T2DM, Diabetes and Food, Diabetes and Exercise, Diabetes and Foot Care, Basic Carb Counting  Discussed The Plate Method  Patient given blood sugar log and instructed on use. Instructed to bring to all future visits.  Patient given literature on [email or web address] for customized diet and lifestyle support

## 2014-09-03 NOTE — Addendum Note (Signed)
Addended by: Velora Heckler on: 09/03/2014 01:43 PM   Modules accepted: Orders, Medications

## 2014-09-03 NOTE — Progress Notes (Signed)
Chief Complaint:  I went to the emergency department last week. I also don't feel that my blood sugars are right.  Subjective:  this is a 24 year old African female insulin-dependent diabetic who is presenting after emergency department visit on August 18. At that time she presented with difficulty urinating and frequency. She had been trying some over-the-counter regimens without relief. In the emergency department her urinalysis  Was slightly abnormal with mucus, bacteria and glucose. Her pregnancy test was negative. Her DNA probe was negative. RPR was negative. Her glucose is 162. She was empirically treated with azithromycin and Rocephin. Since being released she has not had any difficulty with urinating.   She states that her biggest issue is better blood sugar control. She is somewhat noncompliant. She's been out of metformin for at least a month. She has not had test strips and at least a month as well. She has been taking Lantus 10 units at night only. Her blood sugars are not often checked at home. She has not been exercising. She states that she thinks that she's lost approximately 5 pounds over the last few months however.   ROS:  GEN: denies fever or chills, denies change in weight Skin: denies lesions or rashes HEENT: denies headache, earache, epistaxis, sore throat, or neck pain LUNGS: denies SHOB, dyspnea, PND, orthopnea CV: denies CP or palpitations ABD: denies abd pain, N or V EXT: denies muscle spasms or swelling; no pain in lower ext, no weakness NEURO: denies numbness or tingling, denies sz, stroke or TIA   Objective: 130/77   84    98.3 Physical Exam:  General: in no acute distress. Heart: Normal  s1 &s2  Regular rate and rhythm, without murmurs, rubs, gallops. Lungs: Clear to auscultation bilaterally. Extremities: No clubbing cyanosis or edema with positive pedal pulses.  Pertinent Lab Results:CBG/A1C   Medications: Prior to Admission medications   Medication  Sig Start Date End Date Taking? Authorizing Provider  azithromycin (ZITHROMAX) 250 MG tablet Take 2 tabs by mouth today then 1 tab by mouth daily for 4 days Patient not taking: Reported on 03/13/2014 02/13/14   Lorayne Marek, MD  glimepiride (AMARYL) 2 MG tablet Take 1 tablet (2 mg total) by mouth daily before breakfast. Patient not taking: Reported on 11/27/2013 03/14/13   Debbe Odea, MD  glucose blood test strip Use as instructed 09/03/14   Brayton Caves, PA-C  Guaifenesin 1200 MG TB12 Take 1 tablet (1,200 mg total) by mouth 2 (two) times daily. Patient not taking: Reported on 05/04/2014 03/13/14   Dalia Heading, PA-C  hyoscyamine (LEVSIN/SL) 0.125 MG SL tablet Place 1 tablet (0.125 mg total) under the tongue every 6 (six) hours as needed for cramping. Patient not taking: Reported on 02/13/2014 12/10/13   Quintella Reichert, MD  ibuprofen (ADVIL,MOTRIN) 800 MG tablet Take 1 tablet (800 mg total) by mouth every 8 (eight) hours as needed. Patient not taking: Reported on 05/04/2014 03/13/14   Dalia Heading, PA-C  Insulin Glargine (LANTUS) 100 UNIT/ML Solostar Pen Inject 10 Units into the skin daily at 10 pm. 09/03/14   Brayton Caves, PA-C  metFORMIN (GLUCOPHAGE XR) 750 MG 24 hr tablet Take 1 tablet (750 mg total) by mouth daily with breakfast. 09/03/14   Brayton Caves, PA-C  naproxen (NAPROSYN) 500 MG tablet Take 1 tablet (500 mg total) by mouth 2 (two) times daily. Patient not taking: Reported on 08/27/2014 05/04/14   Hyman Bible, PA-C  promethazine (PHENERGAN) 25 MG tablet Take 1 tablet (25 mg  total) by mouth every 6 (six) hours as needed for nausea or vomiting. Patient not taking: Reported on 02/13/2014 12/10/13   Quintella Reichert, MD    Assessment: 1. Dysuria-resolved 2. IDDM 3. Noncompliance  Plan: Education on diet, exercise etc today Refilled Metformin and Insulin Refill test strips Encouraged exercise DM education in 2 weeks w/ nurse or Pharm D  Follow up:4 weeks with PCP  The patient  was given clear instructions to go to ER or return to medical center if symptoms don't improve, worsen or new problems develop. The patient verbalized understanding. The patient was told to call to get lab results if they haven't heard anything in the next week.   This note has been created with Surveyor, quantity. Any transcriptional errors are unintentional.   Zettie Pho, PA-C 09/03/2014, 1:12 PM

## 2014-09-03 NOTE — Patient Instructions (Addendum)
Check your sugar at least 3 times per day and keep a log, bring log to next appointment  Start 30 min of aerobic exercise 3 times per week  Diabetes and Foot Care Diabetes may cause you to have problems because of poor blood supply (circulation) to your feet and legs. This may cause the skin on your feet to become thinner, break easier, and heal more slowly. Your skin may become dry, and the skin may peel and crack. You may also have nerve damage in your legs and feet causing decreased feeling in them. You may not notice minor injuries to your feet that could lead to infections or more serious problems. Taking care of your feet is one of the most important things you can do for yourself.  HOME CARE INSTRUCTIONS  Wear shoes at all times, even in the house. Do not go barefoot. Bare feet are easily injured.  Check your feet daily for blisters, cuts, and redness. If you cannot see the bottom of your feet, use a mirror or ask someone for help.  Wash your feet with warm water (do not use hot water) and mild soap. Then pat your feet and the areas between your toes until they are completely dry. Do not soak your feet as this can dry your skin.  Apply a moisturizing lotion or petroleum jelly (that does not contain alcohol and is unscented) to the skin on your feet and to dry, brittle toenails. Do not apply lotion between your toes.  Trim your toenails straight across. Do not dig under them or around the cuticle. File the edges of your nails with an emery board or nail file.  Do not cut corns or calluses or try to remove them with medicine.  Wear clean socks or stockings every day. Make sure they are not too tight. Do not wear knee-high stockings since they may decrease blood flow to your legs.  Wear shoes that fit properly and have enough cushioning. To break in new shoes, wear them for just a few hours a day. This prevents you from injuring your feet. Always look in your shoes before you put them on to  be sure there are no objects inside.  Do not cross your legs. This may decrease the blood flow to your feet.  If you find a minor scrape, cut, or break in the skin on your feet, keep it and the skin around it clean and dry. These areas may be cleansed with mild soap and water. Do not cleanse the area with peroxide, alcohol, or iodine.  When you remove an adhesive bandage, be sure not to damage the skin around it.  If you have a wound, look at it several times a day to make sure it is healing.  Do not use heating pads or hot water bottles. They may burn your skin. If you have lost feeling in your feet or legs, you may not know it is happening until it is too late.  Make sure your health care provider performs a complete foot exam at least annually or more often if you have foot problems. Report any cuts, sores, or bruises to your health care provider immediately. SEEK MEDICAL CARE IF:   You have an injury that is not healing.  You have cuts or breaks in the skin.  You have an ingrown nail.  You notice redness on your legs or feet.  You feel burning or tingling in your legs or feet.  You have pain or  cramps in your legs and feet.  Your legs or feet are numb.  Your feet always feel cold. SEEK IMMEDIATE MEDICAL CARE IF:   There is increasing redness, swelling, or pain in or around a wound.  There is a red line that goes up your leg.  Pus is coming from a wound.  You develop a fever or as directed by your health care provider.  You notice a bad smell coming from an ulcer or wound. Document Released: 12/24/1999 Document Revised: 08/28/2012 Document Reviewed: 06/04/2012 [facility] Patient Information 2015 [place], [place]. This information is not intended to replace advice given to you by your health care provider. Make sure you discuss any questions you have with your health care provider. Diabetes and Exercise Exercising regularly is important. It is not just about losing weight.  It has many health benefits, such as:  Improving your overall fitness, flexibility, and endurance.  Increasing your bone density.  Helping with weight control.  Decreasing your body fat.  Increasing your muscle strength.  Reducing stress and tension.  Improving your overall health. People with diabetes who exercise gain additional benefits because exercise:  Reduces appetite.  Improves the body's use of blood sugar (glucose).  Helps lower or control blood glucose.  Decreases blood pressure.  Helps control blood lipids (such as cholesterol and triglycerides).  Improves the body's use of the hormone insulin by:  Increasing the body's insulin sensitivity.  Reducing the body's insulin needs.  Decreases the risk for heart disease because exercising:  Lowers cholesterol and triglycerides levels.  Increases the levels of good cholesterol (such as high-density lipoproteins [HDL]) in the body.  Lowers blood glucose levels. YOUR ACTIVITY PLAN  Choose an activity that you enjoy and set realistic goals. Your health care provider or diabetes educator can help you make an activity plan that works for you. Exercise regularly as directed by your health care provider. This includes:  Performing resistance training twice a week such as push-ups, sit-ups, lifting weights, or using resistance bands.  Performing 150 minutes of cardio exercises each week such as walking, running, or playing sports.  Staying active and spending no more than 90 minutes at one time being inactive. Even short bursts of exercise are good for you. Three 10-minute sessions spread throughout the day are just as beneficial as a single 30-minute session. Some exercise ideas include:  Taking the dog for a walk.  Taking the stairs instead of the elevator.  Dancing to your favorite song.  Doing an exercise video.  Doing your favorite exercise with a friend. RECOMMENDATIONS FOR EXERCISING WITH TYPE 1 OR TYPE 2  DIABETES   Check your blood glucose before exercising. If blood glucose levels are greater than 240 mg/dL, check for urine ketones. Do not exercise if ketones are present.  Avoid injecting insulin into areas of the body that are going to be exercised. For example, avoid injecting insulin into:  The arms when playing tennis.  The legs when jogging.  Keep a record of:  Food intake before and after you exercise.  Expected peak times of insulin action.  Blood glucose levels before and after you exercise.  The type and amount of exercise you have done.  Review your records with your health care provider. Your health care provider will help you to develop guidelines for adjusting food intake and insulin amounts before and after exercising.  If you take insulin or oral hypoglycemic agents, watch for signs and symptoms of hypoglycemia. They include:  Dizziness.  Shaking.  Sweating.  Chills.  Confusion.  Drink plenty of water while you exercise to prevent dehydration or heat stroke. Body water is lost during exercise and must be replaced.  Talk to your health care provider before starting an exercise program to make sure it is safe for you. Remember, almost any type of activity is better than none. Document Released: 03/18/2003 Document Revised: 05/12/2013 Document Reviewed: 06/04/2012 [facility] Patient Information 2015 [place], [place]. This information is not intended to replace advice given to you by your health care provider. Make sure you discuss any questions you have with your health care provider. Diabetes Mellitus and Food It is important for you to manage your blood sugar (glucose) level. Your blood glucose level can be greatly affected by what you eat. Eating healthier foods in the appropriate amounts throughout the day at about the same time each day will help you control your blood glucose level. It can also help slow or prevent worsening of your diabetes mellitus. Healthy  eating may even help you improve the level of your blood pressure and reach or maintain a healthy weight.  HOW CAN FOOD AFFECT ME? Carbohydrates Carbohydrates affect your blood glucose level more than any other type of food. Your dietitian will help you determine how many carbohydrates to eat at each meal and teach you how to count carbohydrates. Counting carbohydrates is important to keep your blood glucose at a healthy level, especially if you are using insulin or taking certain medicines for diabetes mellitus. Alcohol Alcohol can cause sudden decreases in blood glucose (hypoglycemia), especially if you use insulin or take certain medicines for diabetes mellitus. Hypoglycemia can be a life-threatening condition. Symptoms of hypoglycemia (sleepiness, dizziness, and disorientation) are similar to symptoms of having too much alcohol.  If your health care provider has given you approval to drink alcohol, do so in moderation and use the following guidelines:  Women should not have more than one drink per day, and men should not have more than two drinks per day. One drink is equal to:  12 oz of beer.  5 oz of wine.  1 oz of hard liquor.  Do not drink on an empty stomach.  Keep yourself hydrated. Have water, diet soda, or unsweetened iced tea.  Regular soda, juice, and other mixers might contain a lot of carbohydrates and should be counted. WHAT FOODS ARE NOT RECOMMENDED? As you make food choices, it is important to remember that all foods are not the same. Some foods have fewer nutrients per serving than other foods, even though they might have the same number of calories or carbohydrates. It is difficult to get your body what it needs when you eat foods with fewer nutrients. Examples of foods that you should avoid that are high in calories and carbohydrates but low in nutrients include:  Trans fats (most processed foods list trans fats on the Nutrition Facts label).  Regular  soda.  Juice.  Candy.  Sweets, such as cake, pie, doughnuts, and cookies.  Fried foods. WHAT FOODS CAN I EAT? Have nutrient-rich foods, which will nourish your body and keep you healthy. The food you should eat also will depend on several factors, including:  The calories you need.  The medicines you take.  Your weight.  Your blood glucose level.  Your blood pressure level.  Your cholesterol level. You also should eat a variety of foods, including:  Protein, such as meat, poultry, fish, tofu, nuts, and seeds (lean animal proteins are best).  Fruits.  Vegetables.  Dairy products, such as milk, cheese, and yogurt (low fat is best).  Breads, grains, pasta, cereal, rice, and beans.  Fats such as olive oil, trans fat-free margarine, canola oil, avocado, and olives. DOES EVERYONE WITH DIABETES MELLITUS HAVE THE SAME MEAL PLAN? Because every person with diabetes mellitus is different, there is not one meal plan that works for everyone. It is very important that you meet with a dietitian who will help you create a meal plan that is just right for you. Document Released: 09/22/2004 Document Revised: 12/31/2012 Document Reviewed: 11/22/2012 [facility] Patient Information 2015 [place], [place]. This information is not intended to replace advice given to you by your health care provider. Make sure you discuss any questions you have with your health care provider. Return in 2 weeks for nurse visit for diabetic education   Type 2 Diabetes Mellitus Type 2 diabetes mellitus, often simply referred to as type 2 diabetes, is a long-lasting (chronic) disease. In type 2 diabetes, the pancreas does not make enough insulin (a hormone), the cells are less responsive to the insulin that is made (insulin resistance), or both. Normally, insulin moves sugars from food into the tissue cells. The tissue cells use the sugars for energy. The lack of insulin or the lack of normal response to insulin causes  excess sugars to build up in the blood instead of going into the tissue cells. As a result, high blood sugar (hyperglycemia) develops. The effect of high sugar (glucose) levels can cause many complications. Type 2 diabetes was also previously called adult-onset diabetes, but it can occur at any age.  RISK FACTORS  A person is predisposed to developing type 2 diabetes if someone in the family has the disease and also has one or more of the following primary risk factors:  Overweight.  An inactive lifestyle.  A history of consistently eating high-calorie foods. Maintaining a normal weight and regular physical activity can reduce the chance of developing type 2 diabetes. SYMPTOMS  A person with type 2 diabetes may not show symptoms initially. The symptoms of type 2 diabetes appear slowly. The symptoms include:  Increased thirst (polydipsia).  Increased urination (polyuria).  Increased urination during the night (nocturia).  Weight loss. This weight loss may be rapid.  Frequent, recurring infections.  Tiredness (fatigue).  Weakness.  Vision changes, such as blurred vision.  Fruity smell to your breath.  Abdominal pain.  Nausea or vomiting.  Cuts or bruises which are slow to heal.  Tingling or numbness in the hands or feet. DIAGNOSIS Type 2 diabetes is frequently not diagnosed until complications of diabetes are present. Type 2 diabetes is diagnosed when symptoms or complications are present and when blood glucose levels are increased. Your blood glucose level may be checked by one or more of the following blood tests:  A fasting blood glucose test. You will not be allowed to eat for at least 8 hours before a blood sample is taken.  A random blood glucose test. Your blood glucose is checked at any time of the day regardless of when you ate.  A hemoglobin A1c blood glucose test. A hemoglobin A1c test provides information about blood glucose control over the previous 3  months.  An oral glucose tolerance test (OGTT). Your blood glucose is measured after you have not eaten (fasted) for 2 hours and then after you drink a glucose-containing beverage. TREATMENT   You may need to take insulin or diabetes medicine daily to keep blood glucose levels  in the desired range.  If you use insulin, you may need to adjust the dosage depending on the carbohydrates that you eat with each meal or snack. The treatment goal is to maintain the before meal blood sugar (preprandial glucose) level at 70-130 mg/dL. HOME CARE INSTRUCTIONS   Have your hemoglobin A1c level checked twice a year.  Perform daily blood glucose monitoring as directed by your health care provider.  Monitor urine ketones when you are ill and as directed by your health care provider.  Take your diabetes medicine or insulin as directed by your health care provider to maintain your blood glucose levels in the desired range.  Never run out of diabetes medicine or insulin. It is needed every day.  If you are using insulin, you may need to adjust the amount of insulin given based on your intake of carbohydrates. Carbohydrates can raise blood glucose levels but need to be included in your diet. Carbohydrates provide vitamins, minerals, and fiber which are an essential part of a healthy diet. Carbohydrates are found in fruits, vegetables, whole grains, dairy products, legumes, and foods containing added sugars.  Eat healthy foods. You should make an appointment to see a registered dietitian to help you create an eating plan that is right for you.  Lose weight if you are overweight.  Carry a medical alert card or wear your medical alert jewelry.  Carry a 15-gram carbohydrate snack with you at all times to treat low blood glucose (hypoglycemia). Some examples of 15-gram carbohydrate snacks include:  Glucose tablets, 3 or 4.  Glucose gel, 15-gram tube.  Raisins, 2 tablespoons (24 grams).  Jelly beans,  6.  Animal crackers, 8.  Regular pop, 4 ounces (120 mL).  Gummy treats, 9.  Recognize hypoglycemia. Hypoglycemia occurs with blood glucose levels of 70 mg/dL and below. The risk for hypoglycemia increases when fasting or skipping meals, during or after intense exercise, and during sleep. Hypoglycemia symptoms can include:  Tremors or shakes.  Decreased ability to concentrate.  Sweating.  Increased heart rate.  Headache.  Dry mouth.  Hunger.  Irritability.  Anxiety.  Restless sleep.  Altered speech or coordination.  Confusion.  Treat hypoglycemia promptly. If you are alert and able to safely swallow, follow the 15:15 rule:  Take 15-20 grams of rapid-acting glucose or carbohydrate. Rapid-acting options include glucose gel, glucose tablets, or 4 ounces (120 mL) of fruit juice, regular soda, or low-fat milk.  Check your blood glucose level 15 minutes after taking the glucose.  Take 15-20 grams more of glucose if the repeat blood glucose level is still 70 mg/dL or below.  Eat a meal or snack within 1 hour once blood glucose levels return to normal.  Be alert to feeling very thirsty and urinating more frequently than usual, which are early signs of hyperglycemia. An early awareness of hyperglycemia allows for prompt treatment. Treat hyperglycemia as directed by your health care provider.  Engage in at least 150 minutes of moderate-intensity physical activity a week, spread over at least 3 days of the week or as directed by your health care provider. In addition, you should engage in resistance exercise at least 2 times a week or as directed by your health care provider. Try to spend no more than 90 minutes at one time inactive.  Adjust your medicine and food intake as needed if you start a new exercise or sport.  Follow your sick-day plan anytime you are unable to eat or drink as usual.  Do not  use any tobacco products including cigarettes, chewing tobacco, or  electronic cigarettes. If you need help quitting, ask your health care provider.  Limit alcohol intake to no more than 1 drink per day for nonpregnant women and 2 drinks per day for men. You should drink alcohol only when you are also eating food. Talk with your health care provider whether alcohol is safe for you. Tell your health care provider if you drink alcohol several times a week.  Keep all follow-up visits as directed by your health care provider. This is important.  Schedule an eye exam soon after the diagnosis of type 2 diabetes and then annually.  Perform daily skin and foot care. Examine your skin and feet daily for cuts, bruises, redness, nail problems, bleeding, blisters, or sores. A foot exam by a health care provider should be done annually.  Brush your teeth and gums at least twice a day and floss at least once a day. Follow up with your dentist regularly.  Share your diabetes management plan with your workplace or school.  Stay up-to-date with immunizations. It is recommended that people with diabetes who are over 40 years old get the pneumonia vaccine. In some cases, two separate shots may be given. Ask your health care provider if your pneumonia vaccination is up-to-date.  Learn to manage stress.  Obtain ongoing diabetes education and support as needed.  Participate in or seek rehabilitation as needed to maintain or improve independence and quality of life. Request a physical or occupational therapy referral if you are having foot or hand numbness, or difficulties with grooming, dressing, eating, or physical activity. SEEK MEDICAL CARE IF:   You are unable to eat food or drink fluids for more than 6 hours.  You have nausea and vomiting for more than 6 hours.  Your blood glucose level is over 240 mg/dL.  There is a change in mental status.  You develop an additional serious illness.  You have diarrhea for more than 6 hours.  You have been sick or have had a fever  for a couple of days and are not getting better.  You have pain during any physical activity.  SEEK IMMEDIATE MEDICAL CARE IF:  You have difficulty breathing.  You have moderate to large ketone levels. MAKE SURE YOU:  Understand these instructions.  Will watch your condition.  Will get help right away if you are not doing well or get worse. Document Released: 12/26/2004 Document Revised: 05/12/2013 Document Reviewed: 07/25/2011 [facility] Patient Information 2015 [place], [place]. This information is not intended to replace advice given to you by your health care provider. Make sure you discuss any questions you have with your health care provider. Basic Carbohydrate Counting for Diabetes Mellitus Carbohydrate counting is a method for keeping track of the amount of carbohydrates you eat. Eating carbohydrates naturally increases the level of sugar (glucose) in your blood, so it is important for you to know the amount that is okay for you to have in every meal. Carbohydrate counting helps keep the level of glucose in your blood within normal limits. The amount of carbohydrates allowed is different for every person. A dietitian can help you calculate the amount that is right for you. Once you know the amount of carbohydrates you can have, you can count the carbohydrates in the foods you want to eat. Carbohydrates are found in the following foods:  Grains, such as breads and cereals.  Dried beans and soy products.  Starchy vegetables, such as potatoes, peas,  and corn.  Fruit and fruit juices.  Milk and yogurt.  Sweets and snack foods, such as cake, cookies, candy, chips, soft drinks, and fruit drinks. CARBOHYDRATE COUNTING There are two ways to count the carbohydrates in your food. You can use either of the methods or a combination of both. Reading the "Nutrition Facts" on [place] The "Nutrition Facts" is an area that is included on the labels of almost all packaged food and  beverages in the [place]. It includes the serving size of that food or beverage and information about the nutrients in each serving of the food, including the grams (g) of carbohydrate per serving.  Decide the number of servings of this food or beverage that you will be able to eat or drink. Multiply that number of servings by the number of grams of carbohydrate that is listed on the label for that serving. The total will be the amount of carbohydrates you will be having when you eat or drink this food or beverage. Learning Standard Serving Sizes of Food When you eat food that is not packaged or does not include "Nutrition Facts" on the label, you need to measure the servings in order to count the amount of carbohydrates.A serving of most carbohydrate-rich foods contains about 15 g of carbohydrates. The following list includes serving sizes of carbohydrate-rich foods that provide 15 g ofcarbohydrate per serving:   1 slice of bread (1 oz) or 1 six-inch tortilla.    of a hamburger bun or English muffin.  4-6 crackers.   cup unsweetened dry cereal.    cup hot cereal.   cup rice or pasta.    cup mashed potatoes or  of a large baked potato.  1 cup fresh fruit or one small piece of fruit.    cup canned or frozen fruit or fruit juice.  1 cup milk.   cup plain fat-free yogurt or yogurt sweetened with artificial sweeteners.   cup cooked dried beans or starchy vegetable, such as peas, corn, or potatoes.  Decide the number of standard-size servings that you will eat. Multiply that number of servings by 15 (the grams of carbohydrates in that serving). For example, if you eat 2 cups of strawberries, you will have eaten 2 servings and 30 g of carbohydrates (2 servings x 15 g = 30 g). For foods such as soups and casseroles, in which more than one food is mixed in, you will need to count the carbohydrates in each food that is included. EXAMPLE OF CARBOHYDRATE COUNTING Sample  Dinner  3 oz chicken breast.   cup of brown rice.   cup of corn.  1 cup milk.   1 cup strawberries with sugar-free whipped topping.  Carbohydrate Calculation Step 1: Identify the foods that contain carbohydrates:   Rice.   Corn.   Milk.   Strawberries. Step 2:Calculate the number of servings eaten of each:   2 servings of rice.   1 serving of corn.   1 serving of milk.   1 serving of strawberries. Step 3: Multiply each of those number of servings by 15 g:   2 servings of rice x 15 g = 30 g.   1 serving of corn x 15 g = 15 g.   1 serving of milk x 15 g = 15 g.   1 serving of strawberries x 15 g = 15 g. Step 4: Add together all of the amounts to find the total grams of carbohydrates eaten: 30 g +  15 g + 15 g + 15 g = 75 g. Document Released: 12/26/2004 Document Revised: 05/12/2013 Document Reviewed: 11/22/2012 [facility] Patient Information 2015 [place], [place]. This information is not intended to replace advice given to you by your health care provider. Make sure you discuss any questions you have with your health care provider.

## 2014-09-04 ENCOUNTER — Encounter: Payer: Self-pay | Admitting: Medical

## 2014-09-16 ENCOUNTER — Encounter: Payer: Self-pay | Admitting: Pharmacist

## 2015-01-26 ENCOUNTER — Ambulatory Visit: Payer: Self-pay | Admitting: Family Medicine

## 2015-01-27 MED FILL — METFORMIN HCL ER 750 MG TAB: 750 | 30 days supply | Qty: 30 | Fill #1

## 2015-06-10 ENCOUNTER — Encounter (HOSPITAL_COMMUNITY): Payer: Self-pay | Admitting: *Deleted

## 2015-06-10 ENCOUNTER — Ambulatory Visit (HOSPITAL_COMMUNITY)
Admission: EM | Admit: 2015-06-10 | Discharge: 2015-06-10 | Disposition: A | Discharge status: Home / Self Care | Payer: BLUE CROSS/BLUE SHIELD | Attending: Emergency Medicine | Admitting: Emergency Medicine

## 2015-06-10 DIAGNOSIS — Z833 Family history of diabetes mellitus: Secondary | ICD-10-CM | POA: Insufficient documentation

## 2015-06-10 DIAGNOSIS — R109 Unspecified abdominal pain: Secondary | ICD-10-CM | POA: Diagnosis present

## 2015-06-10 DIAGNOSIS — E119 Type 2 diabetes mellitus without complications: Secondary | ICD-10-CM | POA: Insufficient documentation

## 2015-06-10 DIAGNOSIS — Z794 Long term (current) use of insulin: Secondary | ICD-10-CM | POA: Diagnosis not present

## 2015-06-10 DIAGNOSIS — Z8249 Family history of ischemic heart disease and other diseases of the circulatory system: Secondary | ICD-10-CM | POA: Insufficient documentation

## 2015-06-10 DIAGNOSIS — N390 Urinary tract infection, site not specified: Secondary | ICD-10-CM

## 2015-06-10 LAB — POCT URINALYSIS DIP (DEVICE)
BILIRUBIN URINE: NEGATIVE
GLUCOSE, UA: 500 mg/dL — AB
KETONES UR: NEGATIVE mg/dL
Nitrite: NEGATIVE
Protein, ur: 30 mg/dL — AB
SPECIFIC GRAVITY, URINE: 1.015 (ref 1.005–1.030)
Urobilinogen, UA: 0.2 mg/dL (ref 0.0–1.0)
pH: 5.5 (ref 5.0–8.0)

## 2015-06-10 LAB — POCT PREGNANCY, URINE: Preg Test, Ur: NEGATIVE

## 2015-06-10 MED ORDER — CEPHALEXIN 500 MG PO CAPS: 500.0000 mg | ORAL_CAPSULE | Freq: Four times a day (QID) | ORAL | Status: DC

## 2015-06-10 NOTE — Discharge Instructions (Signed)
You have a urinary tract infection. Take Keflex 4 times a day for 3 days. Make sure you're drinking plenty of water. Your symptoms should improve in the next 2 days. If you develop fevers, flank pain, or vomiting, please come back.

## 2015-06-10 NOTE — ED Notes (Signed)
Pt  Reports  Low   abd  Pain     With  Blood  In  Urine   X  2   Weeks     Pt  Reports  The  Symptoms  Not  releived  By  Monistat         pt  Reports  She  Is  A  Diabetic       Takes  Metformin        Daily

## 2015-06-10 NOTE — ED Provider Notes (Signed)
CSN: [id number]     Arrival date & time 06/10/15  1532 History   First MD Initiated Contact with Patient 06/10/15 1648     Chief Complaint  Patient presents with  . Abdominal Pain   (Consider location/radiation/quality/duration/timing/severity/associated sxs/prior Treatment) HPI She is a 25 year old woman here for evaluation of hematuria and abdominal pain. Her symptoms started 1-2 weeks ago with blood in the urine, urinary frequency, and urinary urgency. She denies any dysuria, mushy holds her urine. Over the last few days she has developed lower abdominal pain. She's had some intermittent nausea, but no vomiting. No fevers or flank pain. No vaginal discharge. She did have some vaginal itching for which she used Monistat.  Past Medical History  Diagnosis Date  . Acid reflux   . Gonorrhea June 2013  . Diabetes mellitus     metformin   Past Surgical History  Procedure Laterality Date  . No past surgeries     Family History  Problem Relation Age of Onset  . Diabetes Mother   . Hypertension Father   . Heart disease Father   . Diabetes Maternal Aunt   . Hypertension Maternal Aunt   . Diabetes Maternal Uncle   . Hypertension Maternal Uncle   . Cancer Maternal Uncle   . Hypertension Paternal Aunt   . Hypertension Paternal Uncle    Social History  Substance Use Topics  . Smoking status: Never Smoker   . Smokeless tobacco: Never Used  . Alcohol Use: No   OB History    Gravida Para Term Preterm AB TAB SAB Ectopic Multiple Living   _0 0     Review of Systems As in history of present illness Allergies  Review of patient's allergies indicates no known allergies.  Home Medications   Prior to Admission medications   Medication Sig Start Date End Date Taking? Authorizing Provider  Blood Glucose Monitoring Suppl (TRUE METRIX METER) W/DEVICE KIT USE AS INSTRUCTED 09/03/14   Brayton Caves, PA-C  cephALEXin (KEFLEX) 500 MG capsule Take 1 capsule (500 mg total) by mouth 4  (four) times daily. 06/10/15   Melony Overly, MD  glucose blood (TRUE METRIX BLOOD GLUCOSE TEST) test strip Use as instructed 09/03/14   Brayton Caves, PA-C  Insulin Glargine (LANTUS) 100 UNIT/ML Solostar Pen Inject 10 Units into the skin daily at 10 pm. 09/03/14   Brayton Caves, PA-C  metFORMIN (GLUCOPHAGE XR) 750 MG 24 hr tablet Take 1 tablet (750 mg total) by mouth daily with breakfast. 09/03/14   Brayton Caves, PA-C  naproxen (NAPROSYN) 500 MG tablet Take 1 tablet (500 mg total) by mouth 2 (two) times daily. 05/04/14   Hyman Bible, PA-C  TRUEPLUS LANCETS 28G MISC USE AS INSTRUCTED 09/03/14   Brayton Caves, PA-C   Meds Ordered and Administered this Visit  Medications - No data to display  BP 130/86 mmHg  Pulse 90  Temp(Src) 98.1 F (36.7 C) (Oral)  Resp 14  SpO2 100%  LMP 05/29/2015 No data found.   Physical Exam  Constitutional: She is oriented to person, place, and time. She appears well-developed and well-nourished. No distress.  Cardiovascular: Normal rate.   Pulmonary/Chest: Effort normal.  Abdominal: Soft. Bowel sounds are normal. She exhibits no distension. There is tenderness (in suprapubic). There is no rebound and no guarding.  No CVA tenderness  Neurological: She is alert and oriented to person, place, and time.    ED Course  Procedures (including critical care time)  Labs Review Labs Reviewed  POCT URINALYSIS DIP (DEVICE) - Abnormal; Notable for the following:    Glucose, UA 500 (*)    Hgb urine dipstick LARGE (*)    Protein, ur 30 (*)    Leukocytes, UA TRACE (*)    All other components within normal limits  URINE CULTURE  POCT PREGNANCY, URINE    Imaging Review No results found.   MDM   1. UTI (lower urinary tract infection)    Urine sent for culture. Treat with Keflex. Return precautions reviewed.    Melony Overly, MD 06/10/15 [phone]

## 2015-06-11 ENCOUNTER — Telehealth (HOSPITAL_COMMUNITY): Payer: Self-pay | Admitting: Emergency Medicine

## 2015-06-11 DIAGNOSIS — B3749 Other urogenital candidiasis: Secondary | ICD-10-CM

## 2015-06-11 LAB — URINE CULTURE: Culture: >=100000 — AB

## 2015-06-11 MED ORDER — FLUCONAZOLE 200 MG PO TABS: 200.0000 mg | ORAL_TABLET | Freq: Every day | ORAL | Status: AC

## 2015-06-11 NOTE — ED Notes (Signed)
Urine culture with yeast.  No print order for diflucan 200mg  daily x14 days placed in EPIC.  Will be called to pharmacy of choice.  Melony Overly, MD 06/11/15 [phone]

## 2015-06-15 ENCOUNTER — Telehealth (HOSPITAL_COMMUNITY): Payer: Self-pay | Admitting: Emergency Medicine

## 2015-06-15 NOTE — ED Notes (Signed)
Called pt at [phone] but n/a... 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  Need to give lab results from recent visit on 6/1  Per Dr. Bridgett Larsson,  Notes Recorded by Melony Overly, MD on 06/11/2015 at 3:53 PM Please let the patient know that she has a yeast infection in her bladder. Call Diflucan 200mg  daily x14 days into pharmacy of her choice. [place]

## 2015-06-16 ENCOUNTER — Telehealth (HOSPITAL_COMMUNITY): Payer: Self-pay | Admitting: Emergency Medicine

## 2015-06-16 NOTE — ED Notes (Signed)
Pt called back... Gave her results from visit on 6/1 Pt ID'd properly... Reports feeling somewhat better but still having a "wierd" feeling.   Per Dr. Bridgett Larsson,  Notes Recorded by Melony Overly, MD on 06/11/2015 at 3:53 PM Please let the patient know that she has a yeast infection in her bladder. Call Diflucan 200mg  daily x14 days into pharmacy of her choice. [place]  Notified her that her Rx has already been sent to the [facility].  Adv pt if sx are not getting better to return  Pt verb understanding

## 2015-06-16 NOTE — ED Notes (Addendum)
LM on pt's VM [phone] Mailed letter as 3rd attempt Need to give lab results from recent visit on 6/1 Also let pt know labs can be obtained from [facility]  Per Dr. Bridgett Larsson,  Notes Recorded by Melony Overly, MD on 06/11/2015 at 3:53 PM Please let the patient know that she has a yeast infection in her bladder. Call Diflucan 200mg  daily x14 days into pharmacy of her choice. [place]

## 2015-07-22 MED FILL — METFORMIN HCL ER 750 MG TAB: 750 | 30 days supply | Qty: 30 | Fill #2

## 2015-11-08 ENCOUNTER — Other Ambulatory Visit: Payer: Self-pay

## 2015-11-08 ENCOUNTER — Other Ambulatory Visit (HOSPITAL_COMMUNITY)
Admission: RE | Admit: 2015-11-08 | Discharge: 2015-11-08 | Disposition: A | Discharge status: Home / Self Care | Payer: BLUE CROSS/BLUE SHIELD | Source: Ambulatory Visit | Attending: Family | Admitting: Family

## 2015-11-08 DIAGNOSIS — Z01419 Encounter for gynecological examination (general) (routine) without abnormal findings: Secondary | ICD-10-CM | POA: Diagnosis present

## 2015-11-08 DIAGNOSIS — Z113 Encounter for screening for infections with a predominantly sexual mode of transmission: Secondary | ICD-10-CM | POA: Insufficient documentation

## 2015-11-10 LAB — CYTOLOGY - PAP
CHLAMYDIA, DNA PROBE: NEGATIVE
DIAGNOSIS: NEGATIVE
NEISSERIA GONORRHEA: NEGATIVE
TRICH (WINDOWPATH): NEGATIVE

## 2016-03-13 ENCOUNTER — Ambulatory Visit: Payer: BLUE CROSS/BLUE SHIELD | Admitting: Endocrinology

## 2016-03-13 DIAGNOSIS — Z0289 Encounter for other administrative examinations: Secondary | ICD-10-CM

## 2016-03-14 ENCOUNTER — Telehealth: Payer: Self-pay | Admitting: Endocrinology

## 2016-03-14 NOTE — Telephone Encounter (Signed)
Please come in for a new patient appointment in 6 weeks

## 2016-03-14 NOTE — Telephone Encounter (Signed)
Patient no showed today's appt. Please advise on how to follow up. °A. No follow up necessary. °B. Follow up urgent. Contact patient immediately. °C. Follow up necessary. Contact patient and schedule visit in ___ days. °D. Follow up advised. Contact patient and schedule visit in ____weeks. ° °

## 2016-07-17 DIAGNOSIS — Z3490 Encounter for supervision of normal pregnancy, unspecified, unspecified trimester: Secondary | ICD-10-CM | POA: Insufficient documentation

## 2016-07-17 LAB — OB RESULTS CONSOLE ABO/RH: RH Type: POSITIVE

## 2016-07-17 LAB — OB RESULTS CONSOLE RPR: RPR: NONREACTIVE

## 2016-07-17 LAB — OB RESULTS CONSOLE RUBELLA ANTIBODY, IGM: Rubella: IMMUNE

## 2016-07-17 LAB — OB RESULTS CONSOLE HEPATITIS B SURFACE ANTIGEN: HEP B S AG: NEGATIVE

## 2016-07-17 LAB — OB RESULTS CONSOLE HIV ANTIBODY (ROUTINE TESTING): HIV: NONREACTIVE

## 2016-07-17 LAB — OB RESULTS CONSOLE GC/CHLAMYDIA
CHLAMYDIA, DNA PROBE: NEGATIVE
Gonorrhea: NEGATIVE

## 2016-07-17 LAB — OB RESULTS CONSOLE ANTIBODY SCREEN: ANTIBODY SCREEN: NEGATIVE

## 2016-08-14 ENCOUNTER — Ambulatory Visit: Payer: BLUE CROSS/BLUE SHIELD | Admitting: Nutrition

## 2016-08-25 ENCOUNTER — Ambulatory Visit: Payer: BLUE CROSS/BLUE SHIELD | Admitting: *Deleted

## 2016-08-26 ENCOUNTER — Inpatient Hospital Stay (HOSPITAL_COMMUNITY)
Admission: AD | Admit: 2016-08-26 | Discharge: 2016-08-26 | Disposition: A | Discharge status: Home / Self Care | Payer: Medicaid Other | Source: Ambulatory Visit | Attending: Obstetrics and Gynecology | Admitting: Obstetrics and Gynecology

## 2016-08-26 ENCOUNTER — Encounter (HOSPITAL_COMMUNITY): Payer: Self-pay | Admitting: *Deleted

## 2016-08-26 DIAGNOSIS — E119 Type 2 diabetes mellitus without complications: Secondary | ICD-10-CM

## 2016-08-26 DIAGNOSIS — O2342 Unspecified infection of urinary tract in pregnancy, second trimester: Secondary | ICD-10-CM

## 2016-08-26 DIAGNOSIS — Z3A15 15 weeks gestation of pregnancy: Secondary | ICD-10-CM | POA: Diagnosis not present

## 2016-08-26 DIAGNOSIS — O24112 Pre-existing diabetes mellitus, type 2, in pregnancy, second trimester: Secondary | ICD-10-CM | POA: Insufficient documentation

## 2016-08-26 DIAGNOSIS — Z794 Long term (current) use of insulin: Secondary | ICD-10-CM | POA: Diagnosis not present

## 2016-08-26 DIAGNOSIS — D75839 Thrombocytosis, unspecified: Secondary | ICD-10-CM

## 2016-08-26 DIAGNOSIS — D473 Essential (hemorrhagic) thrombocythemia: Secondary | ICD-10-CM

## 2016-08-26 DIAGNOSIS — O0990 Supervision of high risk pregnancy, unspecified, unspecified trimester: Secondary | ICD-10-CM

## 2016-08-26 DIAGNOSIS — R309 Painful micturition, unspecified: Secondary | ICD-10-CM | POA: Diagnosis present

## 2016-08-26 LAB — URINALYSIS, ROUTINE W REFLEX MICROSCOPIC
Bacteria, UA: NONE SEEN
Bilirubin Urine: NEGATIVE
Glucose, UA: >=500 mg/dL — AB
Ketones, ur: 5 mg/dL — AB
Nitrite: NEGATIVE
PROTEIN: 100 mg/dL — AB
Specific Gravity, Urine: 1.032 — ABNORMAL HIGH (ref 1.005–1.030)
pH: 5 (ref 5.0–8.0)

## 2016-08-26 MED ORDER — NITROFURANTOIN MONOHYD MACRO 100 MG PO CAPS: 100.0000 mg | ORAL_CAPSULE | Freq: Two times a day (BID) | ORAL | 0 refills | Status: AC

## 2016-08-26 NOTE — MAU Provider Note (Signed)
History  Andrew Soria is a 26 yo G2P0010 @ 15.5 wks by sure LMP c/w u/s at 12.1 wks presents unannounced to MAU w/ c/o burning sensation after urinating and urgency. Denies fever, vomiting, diarrhea, malodorous urine, pain in lower abdomen/back or hematuria. Started a few days ago and thought it was because she "was holding her urine." No FM yet. Denies leaking or bleeding. States first time experiencing these symptoms. Drank (3) 16.9 bottles of water on 08/25/16. Usually drinks [employer] and teas. Pt has Type 2 DM, controlled w/ NPH - 4 units every a.m., and 4 units every pm.  NKDA.  Patient Active Problem List   Diagnosis Date Noted  . DM (diabetes mellitus), type 2 ([place]) 08/26/2016  . UTI (urinary tract infection) in pregnancy, antepartum, second trimester 08/26/2016  . Thrombocytosis ([place]) 08/26/2016  . High-risk pregnancy supervision 05/13/2012    Chief Complaint  Patient presents with  . Urinary Tract Infection   HPI  As above  OB History    Gravida Para Term Preterm AB Living   2 1       0   SAB TAB Ectopic Multiple Live Births         1      SAB in 2014  Past Medical History:  Diagnosis Date  . Acid reflux   . Diabetes mellitus    metformin  . Gonorrhea June 2013    Past Surgical History:  Procedure Laterality Date  . NO PAST SURGERIES      Family History  Problem Relation Age of Onset  . Diabetes Mother   . Hypertension Father   . Heart disease Father   . Diabetes Maternal Aunt   . Hypertension Maternal Aunt   . Diabetes Maternal Uncle   . Hypertension Maternal Uncle   . Cancer Maternal Uncle   . Hypertension Paternal Aunt   . Hypertension Paternal Uncle     Social History  Substance Use Topics  . Smoking status: Never Smoker  . Smokeless tobacco: Never Used  . Alcohol use No    Allergies: No Known Allergies  No prescriptions prior to admission.    ROS  Burning on urination, urgency/frequency Physical Exam   Blood pressure 121/81,  pulse 86, temperature 98.8 F (37.1 C), temperature source Oral, resp. rate 17, height 5\' 7"  (1.702 m), weight 95.3 kg (210 lb), last menstrual period 05/08/2016.  Results for orders placed or performed during the hospital encounter of 08/26/16 (from the past 24 hour(s))  Urinalysis, Routine w reflex microscopic     Status: Abnormal   Collection Time: 08/26/16 12:47 AM  Result Value Ref Range   Color, Urine YELLOW YELLOW   APPearance CLOUDY (A) CLEAR   Specific Gravity, Urine 1.032 (H) 1.005 - 1.030   pH 5.0 5.0 - 8.0   Glucose, UA >=500 (A) NEGATIVE mg/dL   Hgb urine dipstick MODERATE (A) NEGATIVE   Bilirubin Urine NEGATIVE NEGATIVE   Ketones, ur 5 (A) NEGATIVE mg/dL   Protein, ur 100 (A) NEGATIVE mg/dL   Nitrite NEGATIVE NEGATIVE   Leukocytes, UA LARGE (A) NEGATIVE   RBC / HPF TOO NUMEROUS TO COUNT 0 - 5 RBC/hpf   WBC, UA TOO NUMEROUS TO COUNT 0 - 5 WBC/hpf   Bacteria, UA NONE SEEN NONE SEEN   Squamous Epithelial / LPF 6-30 (A) NONE SEEN   Mucous PRESENT       Physical Exam  Gen: NAD. Lungs: CTAB. CV: RRR w/o M/R/G. Back: Neg CVAT bilaterally. Abdomen:  soft, NT, no rebound or guarding. Ext: WNL. Doptones: 145 bpm.     ED Course  UA Urine culture  Assessment: UTI in pregnancy, 2nd trimester +FHTs  Plan: Macrobid 100 mg po every 12 hrs x 7 days, #14, NR. Reviewed the following: Urinate as soon as need felt. Always wipe from front to rear. Empty bladder before and after intercourse. Avoid intake of high sugar/carbonated drinks. Drink (5) 16.9 oz water a day. Take antibiotic as prescribed, i.e. finish all capsules even if feeling better as infection may return. May need to change medication based on urine culture. Office will call if medication needs to be changed. If yeast infection develops, may use OTC Clotrimazole vaginal/topical cream. Strict SAB precautions. Keep office appt on 08/31/16. Return to MAU or office sooner for worsening  sxs.     Farrel Gordon CNM, MS 08/26/2016, 02:30 AM

## 2016-08-26 NOTE — Discharge Instructions (Signed)

## 2016-08-26 NOTE — MAU Note (Signed)
Pt presents to MAU c/o UTI symptoms pt states she is having urinary frequency and a painful sensation while urinating. Pt denies bleeding or LOF.

## 2016-08-27 LAB — CULTURE, OB URINE

## 2016-10-30 NOTE — Progress Notes (Deleted)
[facility] Note  10/31/2016     CHIEF COMPLAINT Patient presents for No chief complaint on file.   HISTORY OF PRESENT ILLNESS: Julia Willis is a 26 y.o. female who presents to the clinic today for:     Referring physician: No referring provider defined for this encounter.  HISTORICAL INFORMATION:   Selected notes from the MEDICAL RECORD NUMBER Referral from OB/GYN for DM exam ; Ocular Hx-  PMH- DM;    CURRENT MEDICATIONS: No current outpatient prescriptions on file. (Ophthalmic Drugs)   No current facility-administered medications for this visit.  (Ophthalmic Drugs)   Current Outpatient Prescriptions (Other)  Medication Sig   Blood Glucose Monitoring Suppl (TRUE METRIX METER) W/DEVICE KIT USE AS INSTRUCTED   glucose blood (TRUE METRIX BLOOD GLUCOSE TEST) test strip Use as instructed   insulin NPH-regular Human (NOVOLIN 70/30) (70-30) 100 UNIT/ML injection Inject 4 Units into the skin 2 (two) times daily with a meal.   TRUEPLUS LANCETS 28G MISC USE AS INSTRUCTED   No current facility-administered medications for this visit.  (Other)      REVIEW OF SYSTEMS:    ALLERGIES No Known Allergies  PAST MEDICAL HISTORY Past Medical History:  Diagnosis Date   Acid reflux    Diabetes mellitus    metformin   Gonorrhea June 2013   Past Surgical History:  Procedure Laterality Date   NO PAST SURGERIES      FAMILY HISTORY Family History  Problem Relation Age of Onset   Diabetes Mother    Hypertension Father    Heart disease Father    Diabetes Maternal Aunt    Hypertension Maternal Aunt    Diabetes Maternal Uncle    Hypertension Maternal Uncle    Cancer Maternal Uncle    Hypertension Paternal Aunt    Hypertension Paternal Uncle     SOCIAL HISTORY Social History  Substance Use Topics   Smoking status: Never Smoker   Smokeless tobacco: Never Used   Alcohol use No         OPHTHALMIC EXAM:  Not  recorded      IMAGING AND PROCEDURES  Imaging and Procedures for 10/30/16           ASSESSMENT/PLAN:    ICD-10-CM   1. Retinal edema H35.81     1.  2.  3.  Ophthalmic Meds Ordered this visit:  No orders of the defined types were placed in this encounter.      No Follow-up on file.  There are no Patient Instructions on file for this visit.   Explained the diagnoses, plan, and follow up with the patient and they expressed understanding.  Patient expressed understanding of the importance of proper follow up care.   Gardiner Sleeper, M.D., Ph.D. Diseases & Surgery of the Retina and Vitreous [facility] 10/30/16     Abbreviations: M myopia (nearsighted); A astigmatism; H hyperopia (farsighted); P presbyopia; Mrx spectacle prescription;  CTL contact lenses; OD right eye; OS left eye; OU both eyes  XT exotropia; ET esotropia; PEK punctate epithelial keratitis; PEE punctate epithelial erosions; DES dry eye syndrome; MGD meibomian gland dysfunction; ATs artificial tears; PFAT's preservative free artificial tears; [place] nuclear sclerotic cataract; PSC posterior subcapsular cataract; ERM epi-retinal membrane; PVD posterior vitreous detachment; RD retinal detachment; DM diabetes mellitus; DR diabetic retinopathy; NPDR non-proliferative diabetic retinopathy; PDR proliferative diabetic retinopathy; CSME clinically significant macular edema; DME diabetic macular edema; dbh dot blot hemorrhages; CWS cotton wool  spot; POAG primary open angle glaucoma; C/D cup-to-disc ratio; HVF humphrey visual field; GVF goldmann visual field; OCT optical coherence tomography; IOP intraocular pressure; BRVO Branch retinal vein occlusion; CRVO central retinal vein occlusion; CRAO central retinal artery occlusion; BRAO branch retinal artery occlusion; RT retinal tear; SB scleral buckle; PPV pars plana vitrectomy; VH Vitreous hemorrhage; PRP panretinal laser photocoagulation; IVK  intravitreal kenalog; VMT vitreomacular traction; MH Macular hole;  NVD neovascularization of the disc; NVE neovascularization elsewhere; AREDS age related eye disease study; ARMD age related macular degeneration; POAG primary open angle glaucoma; EBMD epithelial/anterior basement membrane dystrophy; ACIOL anterior chamber intraocular lens; IOL intraocular lens; PCIOL posterior chamber intraocular lens; Phaco/IOL phacoemulsification with intraocular lens placement; [place] photorefractive keratectomy; LASIK laser assisted in situ keratomileusis; HTN hypertension; DM diabetes mellitus; COPD chronic obstructive pulmonary disease

## 2016-10-31 ENCOUNTER — Encounter (INDEPENDENT_AMBULATORY_CARE_PROVIDER_SITE_OTHER): Payer: Self-pay | Admitting: Ophthalmology

## 2017-01-11 LAB — OB RESULTS CONSOLE GBS: STREP GROUP B AG: POSITIVE

## 2017-01-13 ENCOUNTER — Encounter (HOSPITAL_COMMUNITY): Payer: Self-pay

## 2017-01-13 ENCOUNTER — Inpatient Hospital Stay (HOSPITAL_COMMUNITY)
Admission: AD | Admit: 2017-01-13 | Discharge: 2017-01-13 | Disposition: A | Discharge status: Home / Self Care | Payer: Medicaid Other | Source: Ambulatory Visit | Attending: Obstetrics and Gynecology | Admitting: Obstetrics and Gynecology

## 2017-01-13 ENCOUNTER — Inpatient Hospital Stay (HOSPITAL_COMMUNITY): Payer: Medicaid Other

## 2017-01-13 DIAGNOSIS — Z3A35 35 weeks gestation of pregnancy: Secondary | ICD-10-CM | POA: Diagnosis not present

## 2017-01-13 DIAGNOSIS — O4693 Antepartum hemorrhage, unspecified, third trimester: Secondary | ICD-10-CM | POA: Insufficient documentation

## 2017-01-13 LAB — WET PREP, GENITAL
Clue Cells Wet Prep HPF POC: NONE SEEN
Sperm: NONE SEEN
Trich, Wet Prep: NONE SEEN

## 2017-01-13 LAB — GLUCOSE, CAPILLARY: Glucose-Capillary: 109 mg/dL — ABNORMAL HIGH (ref 65–99)

## 2017-01-13 LAB — URINALYSIS, ROUTINE W REFLEX MICROSCOPIC
Bilirubin Urine: NEGATIVE
Glucose, UA: >=500 mg/dL — AB
Ketones, ur: 5 mg/dL — AB
Nitrite: NEGATIVE
Protein, ur: 100 mg/dL — AB
Specific Gravity, Urine: 1.02 (ref 1.005–1.030)
Squamous Epithelial / LPF: NONE SEEN
pH: 6 (ref 5.0–8.0)

## 2017-01-13 LAB — CBC
HCT: 34.5 % — ABNORMAL LOW (ref 36.0–46.0)
Hemoglobin: 11.1 g/dL — ABNORMAL LOW (ref 12.0–15.0)
MCH: 25.4 pg — ABNORMAL LOW (ref 26.0–34.0)
MCHC: 32.2 g/dL (ref 30.0–36.0)
MCV: 78.9 fL (ref 78.0–100.0)
Platelets: 373 10*3/uL (ref 150–400)
RBC: 4.37 MIL/uL (ref 3.87–5.11)
RDW: 13.8 % (ref 11.5–15.5)
WBC: 5.2 10*3/uL (ref 4.0–10.5)

## 2017-01-13 LAB — GROUP B STREP BY PCR: Group B strep by PCR: POSITIVE — AB

## 2017-01-13 MED ORDER — LACTATED RINGERS IV BOLUS (SEPSIS)
1000.0000 mL | Freq: Once | INTRAVENOUS | Status: AC
  Administered 2017-01-13: 1000 mL via INTRAVENOUS

## 2017-01-13 MED ORDER — TERCONAZOLE 0.4 % VA CREA: 1.0000 | TOPICAL_CREAM | Freq: Every day | VAGINAL | 0 refills | Status: DC

## 2017-01-13 NOTE — MAU Note (Signed)
Patient reports having some bleeding, was treated for vaginal infection and was having it then but even after medication still having.

## 2017-01-13 NOTE — Discharge Instructions (Signed)

## 2017-01-13 NOTE — MAU Note (Signed)
Pt states since yesterday she has had frequent contractions and pelvic pressure.

## 2017-01-25 ENCOUNTER — Telehealth (HOSPITAL_COMMUNITY): Payer: Self-pay | Admitting: *Deleted

## 2017-01-25 ENCOUNTER — Encounter (HOSPITAL_COMMUNITY): Payer: Self-pay | Admitting: *Deleted

## 2017-01-25 NOTE — Telephone Encounter (Signed)
Preadmission screen  

## 2017-01-26 ENCOUNTER — Other Ambulatory Visit: Payer: Self-pay | Admitting: Obstetrics & Gynecology

## 2017-02-04 ENCOUNTER — Inpatient Hospital Stay (HOSPITAL_COMMUNITY)
Admission: AD | Admit: 2017-02-04 | Payer: Medicaid Other | Source: Ambulatory Visit | Admitting: Obstetrics and Gynecology

## 2017-02-04 NOTE — H&P (Signed)
Julia Willis is a 27 y.o. female, G2P1001 at 29 weeks, presenting for induction of labor due to DM type 2. Pt on insulin during pregnancy.  During first trimester HgA1c was 10  Repeat on 11/6 8.8  Pt also was noted to have thrombocytosis with plt 404 at 28 weeks.  Prenatal hx also remarkable for morbid obesity.  Patient Active Problem List   Diagnosis Date Noted  . DM (diabetes mellitus), type 2 ([place]) 08/26/2016  . UTI (urinary tract infection) in pregnancy, antepartum, second trimester 08/26/2016  . Thrombocytosis ([place]) 08/26/2016  . High-risk pregnancy supervision 05/13/2012    History of present pregnancy: Patient entered care at 10 weeks.   EDC of 02/12/2017 was established by LMP/US   Anatomy scan:  20 weeks, with normal findings and an anterior placenta.   Additional [place] evaluations:  For growth last [place] on 02/01/2017 afi wnl EFW 64%  Significant prenatal events:  Type 2 diabetes Last evaluation:  Last week  OB History    Gravida Para Term Preterm AB Living   2 1       0   SAB TAB Ectopic Multiple Live Births         1       Past Medical History:  Diagnosis Date  . Acid reflux   . Diabetes mellitus    metformin  . Gonorrhea June 2013  . Thrombocytosis ([place])    Past Surgical History:  Procedure Laterality Date  . NO PAST SURGERIES     Family History: family history includes Cancer in her maternal uncle; Diabetes in her maternal aunt, maternal uncle, and mother; Heart disease in her father; Hypertension in her father, maternal aunt, maternal uncle, paternal aunt, and paternal uncle. Social History:  reports that  has never smoked. she has never used smokeless tobacco. She reports that she does not drink alcohol or use drugs.   Prenatal Transfer Tool  Maternal Diabetes: Yes:  Diabetes Type:  Pre-pregnancy Genetic Screening: Normal Maternal Ultrasounds/Referrals: Normal Fetal Ultrasounds or other Referrals:  None Maternal Substance Abuse:  No Significant Maternal  Medications:  None Significant Maternal Lab Results: None  TDAP Declined Flu UTD  ROS:  All 10 systems reviewed and neg except as stated above  No Known Allergies     Last menstrual period 05/08/2016.  Chest clear Heart RRR without murmur Abd gravid, NT, FH S>D Pelvic: per RN Ext: 1+/1+ edema  FHR: Category 2  UCs:  2-3 in 10 minutes  Prenatal labs: ABO, Rh: O/Positive/-- (07/09 0000) Antibody: Negative (07/09 0000) Rubella:  Immune (07/09 0000) RPR: Nonreactive (07/09 0000)  HBsAg: Negative (07/09 0000)  HIV: Non-reactive (07/09 0000)  GBS: Positive (01/03 0000) Sickle cell/Hgb electrophoresis:  AA Pap:  2017 wnl GC:  Neg Chlamydia:  Neg Genetic screenings:  wnl HgbA1c 10 repeat 8.8 Other:   Hgb 11.6 at NOB, 9.6at 28 weeks       Assessment/Plan: IUP at 39 weeks induction for DM type 2 Cat 2 strip GBS psoitive  Plan: Admit to [facility]  Routine [facility] orders Pain med/epidural prn PCN G for GBS prophylaxis   Pleas Koch ProtheroCNM, MSN 02/04/2017, 10:30 PM

## 2017-02-04 NOTE — H&P (Deleted)
  The note originally documented on this encounter has been moved the the encounter in which it belongs.  

## 2017-02-05 ENCOUNTER — Inpatient Hospital Stay (HOSPITAL_COMMUNITY): Payer: Medicaid Other | Admitting: Anesthesiology

## 2017-02-05 ENCOUNTER — Other Ambulatory Visit: Payer: Self-pay

## 2017-02-05 ENCOUNTER — Encounter (HOSPITAL_COMMUNITY): Payer: Self-pay

## 2017-02-05 ENCOUNTER — Encounter (HOSPITAL_COMMUNITY)
Admission: RE | Disposition: A | Discharge status: Home / Self Care | Payer: Self-pay | Source: Ambulatory Visit | Attending: Obstetrics & Gynecology

## 2017-02-05 ENCOUNTER — Inpatient Hospital Stay (HOSPITAL_COMMUNITY)
Admission: RE | Admit: 2017-02-05 | Discharge: 2017-02-06 | DRG: 788 | Disposition: A | Discharge status: Home / Self Care | Payer: Medicaid Other | Source: Ambulatory Visit | Attending: Obstetrics & Gynecology | Admitting: Obstetrics & Gynecology

## 2017-02-05 DIAGNOSIS — D473 Essential (hemorrhagic) thrombocythemia: Secondary | ICD-10-CM | POA: Diagnosis present

## 2017-02-05 DIAGNOSIS — E119 Type 2 diabetes mellitus without complications: Secondary | ICD-10-CM | POA: Diagnosis present

## 2017-02-05 DIAGNOSIS — Z794 Long term (current) use of insulin: Secondary | ICD-10-CM | POA: Diagnosis not present

## 2017-02-05 DIAGNOSIS — O2412 Pre-existing diabetes mellitus, type 2, in childbirth: Principal | ICD-10-CM | POA: Diagnosis present

## 2017-02-05 DIAGNOSIS — O1494 Unspecified pre-eclampsia, complicating childbirth: Secondary | ICD-10-CM | POA: Diagnosis present

## 2017-02-05 DIAGNOSIS — O99824 Streptococcus B carrier state complicating childbirth: Secondary | ICD-10-CM | POA: Diagnosis present

## 2017-02-05 DIAGNOSIS — Z3A39 39 weeks gestation of pregnancy: Secondary | ICD-10-CM

## 2017-02-05 DIAGNOSIS — O99214 Obesity complicating childbirth: Secondary | ICD-10-CM | POA: Diagnosis present

## 2017-02-05 DIAGNOSIS — O24913 Unspecified diabetes mellitus in pregnancy, third trimester: Secondary | ICD-10-CM | POA: Diagnosis present

## 2017-02-05 DIAGNOSIS — O3663X0 Maternal care for excessive fetal growth, third trimester, not applicable or unspecified: Secondary | ICD-10-CM | POA: Diagnosis present

## 2017-02-05 LAB — COMPREHENSIVE METABOLIC PANEL
ALK PHOS: 169 U/L — AB (ref 38–126)
ALT: 17 U/L (ref 14–54)
ANION GAP: 7 (ref 5–15)
AST: 24 U/L (ref 15–41)
Albumin: 2.7 g/dL — ABNORMAL LOW (ref 3.5–5.0)
BILIRUBIN TOTAL: 0.5 mg/dL (ref 0.3–1.2)
BUN: 8 mg/dL (ref 6–20)
CALCIUM: 9.2 mg/dL (ref 8.9–10.3)
CO2: 19 mmol/L — ABNORMAL LOW (ref 22–32)
Chloride: 105 mmol/L (ref 101–111)
Creatinine, Ser: 0.47 mg/dL (ref 0.44–1.00)
GFR calc non Af Amer: >60 mL/min (ref >60)
Glucose, Bld: 106 mg/dL — ABNORMAL HIGH (ref 65–99)
Potassium: 4.6 mmol/L (ref 3.5–5.1)
SODIUM: 131 mmol/L — AB (ref 135–145)
Total Protein: 7.2 g/dL (ref 6.5–8.1)

## 2017-02-05 LAB — RPR: RPR: NONREACTIVE

## 2017-02-05 LAB — TYPE AND SCREEN
ABO/RH(D): O POS
Antibody Screen: NEGATIVE

## 2017-02-05 LAB — PROTEIN / CREATININE RATIO, URINE
CREATININE, URINE: 35 mg/dL
Protein Creatinine Ratio: 0.43 mg/mg{Cre} — ABNORMAL HIGH (ref 0.00–0.15)
Total Protein, Urine: 15 mg/dL

## 2017-02-05 LAB — CBC
HEMATOCRIT: 34.4 % — AB (ref 36.0–46.0)
HEMOGLOBIN: 10.8 g/dL — AB (ref 12.0–15.0)
MCH: 24.5 pg — AB (ref 26.0–34.0)
MCHC: 31.4 g/dL (ref 30.0–36.0)
MCV: 78 fL (ref 78.0–100.0)
Platelets: 455 10*3/uL — ABNORMAL HIGH (ref 150–400)
RBC: 4.41 MIL/uL (ref 3.87–5.11)
RDW: 14.6 % (ref 11.5–15.5)
WBC: 5.3 10*3/uL (ref 4.0–10.5)

## 2017-02-05 LAB — ABO/RH: ABO/RH(D): O POS

## 2017-02-05 LAB — GLUCOSE, CAPILLARY
GLUCOSE-CAPILLARY: 97 mg/dL (ref 65–99)
Glucose-Capillary: 92 mg/dL (ref 65–99)
Glucose-Capillary: 106 mg/dL — ABNORMAL HIGH (ref 65–99)
Glucose-Capillary: 147 mg/dL — ABNORMAL HIGH (ref 65–99)

## 2017-02-05 SURGERY — Surgical Case
Anesthesia: Spinal

## 2017-02-05 MED ORDER — ENOXAPARIN SODIUM 60 MG/0.6ML ~~LOC~~ SOLN
0.5000 mg/kg | SUBCUTANEOUS | Status: DC
  Administered 2017-02-06: 55 mg via SUBCUTANEOUS
  Filled 2017-02-05 (×2): qty 0.6

## 2017-02-05 MED ORDER — PENICILLIN G POT IN DEXTROSE 60000 UNIT/ML IV SOLN
3.0000 10*6.[IU] | INTRAVENOUS | Status: DC
  Filled 2017-02-05 (×2): qty 50

## 2017-02-05 MED ORDER — LACTATED RINGERS IV SOLN
INTRAVENOUS | Status: DC | PRN
  Administered 2017-02-05 (×2): via INTRAVENOUS

## 2017-02-05 MED ORDER — FENTANYL CITRATE (PF) 100 MCG/2ML IJ SOLN
INTRAMUSCULAR | Status: AC
  Filled 2017-02-05: qty 2

## 2017-02-05 MED ORDER — LACTATED RINGERS IV SOLN
INTRAVENOUS | Status: DC
  Administered 2017-02-05: 01:00:00 via INTRAVENOUS

## 2017-02-05 MED ORDER — LABETALOL HCL 5 MG/ML IV SOLN: 20.0000 mg | INTRAVENOUS | Status: DC | PRN

## 2017-02-05 MED ORDER — OXYTOCIN 10 UNIT/ML IJ SOLN
INTRAMUSCULAR | Status: AC
  Filled 2017-02-05: qty 4

## 2017-02-05 MED ORDER — SIMETHICONE 80 MG PO CHEW
80.0000 mg | CHEWABLE_TABLET | Freq: Three times a day (TID) | ORAL | Status: DC
  Administered 2017-02-05 – 2017-02-06 (×5): 80 mg via ORAL
  Filled 2017-02-05 (×5): qty 1

## 2017-02-05 MED ORDER — PENICILLIN G POT IN DEXTROSE 60000 UNIT/ML IV SOLN
3.0000 10*6.[IU] | INTRAVENOUS | Status: DC
  Filled 2017-02-05 (×3): qty 50

## 2017-02-05 MED ORDER — SCOPOLAMINE 1 MG/3DAYS TD PT72
MEDICATED_PATCH | TRANSDERMAL | Status: AC
  Filled 2017-02-05: qty 1

## 2017-02-05 MED ORDER — FENTANYL CITRATE (PF) 100 MCG/2ML IJ SOLN
25.0000 ug | INTRAMUSCULAR | Status: DC | PRN
  Administered 2017-02-05: 50 ug via INTRAVENOUS
  Administered 2017-02-05: 25 ug via INTRAVENOUS

## 2017-02-05 MED ORDER — ONDANSETRON HCL 4 MG/2ML IJ SOLN
INTRAMUSCULAR | Status: DC | PRN
  Administered 2017-02-05: 4 mg via INTRAVENOUS

## 2017-02-05 MED ORDER — HYDRALAZINE HCL 20 MG/ML IJ SOLN: 10.0000 mg | Freq: Once | INTRAMUSCULAR | Status: DC | PRN

## 2017-02-05 MED ORDER — TETANUS-DIPHTH-ACELL PERTUSSIS 5-2.5-18.5 LF-MCG/0.5 IM SUSP: 0.5000 mL | Freq: Once | INTRAMUSCULAR | Status: DC

## 2017-02-05 MED ORDER — FENTANYL CITRATE (PF) 100 MCG/2ML IJ SOLN: 50.0000 ug | INTRAMUSCULAR | Status: DC | PRN

## 2017-02-05 MED ORDER — SIMETHICONE 80 MG PO CHEW: 80.0000 mg | CHEWABLE_TABLET | ORAL | Status: DC | PRN

## 2017-02-05 MED ORDER — LACTATED RINGERS IV SOLN
INTRAVENOUS | Status: DC | PRN
  Administered 2017-02-05: 05:00:00 via INTRAVENOUS

## 2017-02-05 MED ORDER — MISOPROSTOL 25 MCG QUARTER TABLET
25.0000 ug | ORAL_TABLET | ORAL | Status: DC | PRN
  Filled 2017-02-05: qty 1

## 2017-02-05 MED ORDER — TERBUTALINE SULFATE 1 MG/ML IJ SOLN: 0.2500 mg | Freq: Once | INTRAMUSCULAR | Status: DC | PRN

## 2017-02-05 MED ORDER — SODIUM CHLORIDE 0.9 % IR SOLN
Status: DC | PRN
  Administered 2017-02-05 (×3): 1

## 2017-02-05 MED ORDER — SCOPOLAMINE 1 MG/3DAYS TD PT72
MEDICATED_PATCH | TRANSDERMAL | Status: DC | PRN
  Administered 2017-02-05: 1 via TRANSDERMAL

## 2017-02-05 MED ORDER — ONDANSETRON HCL 4 MG/2ML IJ SOLN
INTRAMUSCULAR | Status: AC
Start: 2017-02-05 — End: ?
  Filled 2017-02-05: qty 2

## 2017-02-05 MED ORDER — ENOXAPARIN SODIUM 40 MG/0.4ML ~~LOC~~ SOLN: 40.0000 mg | SUBCUTANEOUS | Status: DC

## 2017-02-05 MED ORDER — LIDOCAINE HCL (PF) 1 % IJ SOLN: 30.0000 mL | INTRAMUSCULAR | Status: DC | PRN

## 2017-02-05 MED ORDER — MISOPROSTOL 200 MCG PO TABS: 800.0000 ug | ORAL_TABLET | Freq: Once | ORAL | Status: DC | PRN

## 2017-02-05 MED ORDER — CEFAZOLIN SODIUM-DEXTROSE 2-4 GM/100ML-% IV SOLN
INTRAVENOUS | Status: AC
  Filled 2017-02-05: qty 100

## 2017-02-05 MED ORDER — FENTANYL CITRATE (PF) 100 MCG/2ML IJ SOLN
INTRAMUSCULAR | Status: DC | PRN
  Administered 2017-02-05: 25 ug via INTRATHECAL

## 2017-02-05 MED ORDER — MENTHOL 3 MG MT LOZG
1.0000 | LOZENGE | OROMUCOSAL | Status: DC | PRN
  Filled 2017-02-05: qty 9

## 2017-02-05 MED ORDER — ONDANSETRON HCL 4 MG/2ML IJ SOLN: 4.0000 mg | Freq: Four times a day (QID) | INTRAMUSCULAR | Status: DC | PRN

## 2017-02-05 MED ORDER — ZOLPIDEM TARTRATE 5 MG PO TABS: 5.0000 mg | ORAL_TABLET | Freq: Every evening | ORAL | Status: DC | PRN

## 2017-02-05 MED ORDER — COCONUT OIL OIL: 1.0000 "application " | TOPICAL_OIL | Status: DC | PRN

## 2017-02-05 MED ORDER — INSULIN ASPART 100 UNIT/ML ~~LOC~~ SOLN
0.0000 [IU] | Freq: Three times a day (TID) | SUBCUTANEOUS | Status: DC
  Administered 2017-02-05: 2 [IU] via SUBCUTANEOUS
  Administered 2017-02-06: 3 [IU] via SUBCUTANEOUS

## 2017-02-05 MED ORDER — IBUPROFEN 600 MG PO TABS
600.0000 mg | ORAL_TABLET | Freq: Four times a day (QID) | ORAL | Status: DC
  Administered 2017-02-05 – 2017-02-06 (×6): 600 mg via ORAL
  Filled 2017-02-05 (×6): qty 1

## 2017-02-05 MED ORDER — OXYTOCIN 10 UNIT/ML IJ SOLN
INTRAVENOUS | Status: DC | PRN
  Administered 2017-02-05: 40 [IU] via INTRAVENOUS

## 2017-02-05 MED ORDER — PHENYLEPHRINE 8 MG IN D5W 100 ML (0.08MG/ML) PREMIX OPTIME
INJECTION | INTRAVENOUS | Status: DC | PRN
  Administered 2017-02-05: 60 ug/min via INTRAVENOUS

## 2017-02-05 MED ORDER — LACTATED RINGERS IV SOLN
INTRAVENOUS | Status: DC
  Administered 2017-02-05: 13:00:00 via INTRAVENOUS

## 2017-02-05 MED ORDER — DIPHENHYDRAMINE HCL 25 MG PO CAPS
25.0000 mg | ORAL_CAPSULE | Freq: Four times a day (QID) | ORAL | Status: DC | PRN
  Administered 2017-02-05 (×2): 25 mg via ORAL
  Filled 2017-02-05 (×2): qty 1

## 2017-02-05 MED ORDER — MAGNESIUM SULFATE 40 G IN LACTATED RINGERS - SIMPLE
2.0000 g/h | INTRAVENOUS | Status: AC
  Administered 2017-02-06: 2 g/h via INTRAVENOUS
  Filled 2017-02-05: qty 500
  Filled 2017-02-05: qty 40

## 2017-02-05 MED ORDER — MAGNESIUM SULFATE BOLUS VIA INFUSION
4.0000 g | Freq: Once | INTRAVENOUS | Status: AC
  Administered 2017-02-05: 4 g via INTRAVENOUS
  Filled 2017-02-05: qty 500

## 2017-02-05 MED ORDER — LACTATED RINGERS IV SOLN
500.0000 mL | INTRAVENOUS | Status: DC | PRN
  Administered 2017-02-05: 300 mL via INTRAVENOUS

## 2017-02-05 MED ORDER — MORPHINE SULFATE (PF) 0.5 MG/ML IJ SOLN
INTRAMUSCULAR | Status: DC | PRN
  Administered 2017-02-05: .1 mg via INTRATHECAL

## 2017-02-05 MED ORDER — OXYTOCIN 40 UNITS IN LACTATED RINGERS INFUSION - SIMPLE MED: 2.5000 [IU]/h | INTRAVENOUS | Status: DC

## 2017-02-05 MED ORDER — CEFAZOLIN SODIUM-DEXTROSE 2-3 GM-%(50ML) IV SOLR
INTRAVENOUS | Status: DC | PRN
  Administered 2017-02-05: 2 g via INTRAVENOUS

## 2017-02-05 MED ORDER — FLEET ENEMA 7-19 GM/118ML RE ENEM: 1.0000 | ENEMA | RECTAL | Status: DC | PRN

## 2017-02-05 MED ORDER — FERROUS SULFATE 325 (65 FE) MG PO TABS
325.0000 mg | ORAL_TABLET | Freq: Two times a day (BID) | ORAL | Status: DC
  Administered 2017-02-05 – 2017-02-06 (×3): 325 mg via ORAL
  Filled 2017-02-05 (×3): qty 1

## 2017-02-05 MED ORDER — INSULIN ASPART 100 UNIT/ML ~~LOC~~ SOLN
2.0000 [IU] | Freq: Every day | SUBCUTANEOUS | Status: DC
  Administered 2017-02-05: 2 [IU] via SUBCUTANEOUS

## 2017-02-05 MED ORDER — PRENATAL MULTIVITAMIN CH
1.0000 | ORAL_TABLET | Freq: Every day | ORAL | Status: DC
  Administered 2017-02-05 – 2017-02-06 (×2): 1 via ORAL
  Filled 2017-02-05 (×2): qty 1

## 2017-02-05 MED ORDER — PHENYLEPHRINE 8 MG IN D5W 100 ML (0.08MG/ML) PREMIX OPTIME
INJECTION | INTRAVENOUS | Status: AC
  Filled 2017-02-05: qty 100

## 2017-02-05 MED ORDER — MORPHINE SULFATE (PF) 0.5 MG/ML IJ SOLN
INTRAMUSCULAR | Status: AC
  Filled 2017-02-05: qty 10

## 2017-02-05 MED ORDER — WITCH HAZEL-GLYCERIN EX PADS: 1.0000 "application " | MEDICATED_PAD | CUTANEOUS | Status: DC | PRN

## 2017-02-05 MED ORDER — OXYTOCIN BOLUS FROM INFUSION: 500.0000 mL | Freq: Once | INTRAVENOUS | Status: DC

## 2017-02-05 MED ORDER — PENICILLIN G POTASSIUM 5000000 UNITS IJ SOLR
5.0000 10*6.[IU] | Freq: Once | INTRAVENOUS | Status: DC
  Filled 2017-02-05: qty 5

## 2017-02-05 MED ORDER — OXYTOCIN 40 UNITS IN LACTATED RINGERS INFUSION - SIMPLE MED: 1.0000 m[IU]/min | INTRAVENOUS | Status: DC

## 2017-02-05 MED ORDER — ACETAMINOPHEN 325 MG PO TABS
650.0000 mg | ORAL_TABLET | ORAL | Status: DC | PRN
  Administered 2017-02-05: 650 mg via ORAL
  Filled 2017-02-05: qty 2

## 2017-02-05 MED ORDER — OXYCODONE-ACETAMINOPHEN 5-325 MG PO TABS
1.0000 | ORAL_TABLET | ORAL | Status: DC | PRN
  Administered 2017-02-05 – 2017-02-06 (×2): 1 via ORAL
  Filled 2017-02-05 (×2): qty 1

## 2017-02-05 MED ORDER — SIMETHICONE 80 MG PO CHEW
80.0000 mg | CHEWABLE_TABLET | ORAL | Status: DC
  Administered 2017-02-05: 80 mg via ORAL
  Filled 2017-02-05: qty 1

## 2017-02-05 MED ORDER — DIBUCAINE 1 % RE OINT: 1.0000 "application " | TOPICAL_OINTMENT | RECTAL | Status: DC | PRN

## 2017-02-05 MED ORDER — SOD CITRATE-CITRIC ACID 500-334 MG/5ML PO SOLN
30.0000 mL | ORAL | Status: DC | PRN
  Administered 2017-02-05: 30 mL via ORAL
  Filled 2017-02-05: qty 15

## 2017-02-05 MED ORDER — SENNOSIDES-DOCUSATE SODIUM 8.6-50 MG PO TABS
2.0000 | ORAL_TABLET | ORAL | Status: DC
  Administered 2017-02-05: 2 via ORAL
  Filled 2017-02-05: qty 2

## 2017-02-05 MED ORDER — OXYTOCIN 40 UNITS IN LACTATED RINGERS INFUSION - SIMPLE MED: 2.5000 [IU]/h | INTRAVENOUS | Status: AC

## 2017-02-05 MED ORDER — OXYCODONE-ACETAMINOPHEN 5-325 MG PO TABS: 2.0000 | ORAL_TABLET | ORAL | Status: DC | PRN

## 2017-02-05 MED ORDER — ACETAMINOPHEN 325 MG PO TABS: 650.0000 mg | ORAL_TABLET | ORAL | Status: DC | PRN

## 2017-02-05 SURGICAL SUPPLY — 33 items
BARRIER ADHS 3X4 INTERCEED (GAUZE/BANDAGES/DRESSINGS) ×2 IMPLANT
BENZOIN TINCTURE PRP APPL 2/3 (GAUZE/BANDAGES/DRESSINGS) IMPLANT
CHLORAPREP W/TINT 26ML (MISCELLANEOUS) ×2 IMPLANT
CLAMP CORD UMBIL (MISCELLANEOUS) IMPLANT
CLOTH BEACON ORANGE TIMEOUT ST (SAFETY) ×2 IMPLANT
DERMABOND ADVANCED (GAUZE/BANDAGES/DRESSINGS)
DERMABOND ADVANCED .7 DNX12 (GAUZE/BANDAGES/DRESSINGS) IMPLANT
DRSG OPSITE POSTOP 4X10 (GAUZE/BANDAGES/DRESSINGS) ×2 IMPLANT
ELECT REM PT RETURN 9FT ADLT (ELECTROSURGICAL) ×2
ELECTRODE REM PT RTRN 9FT ADLT (ELECTROSURGICAL) ×1 IMPLANT
EXTRACTOR VACUUM BELL STYLE (SUCTIONS) IMPLANT
GLOVE BIO SURGEON STRL SZ7 (GLOVE) ×2 IMPLANT
GLOVE BIOGEL PI IND STRL 7.0 (GLOVE) ×2 IMPLANT
GLOVE BIOGEL PI INDICATOR 7.0 (GLOVE) ×2
GOWN STRL REUS W/TWL LRG LVL3 (GOWN DISPOSABLE) ×4 IMPLANT
KIT ABG SYR 3ML LUER SLIP (SYRINGE) IMPLANT
NEEDLE HYPO 25X5/8 SAFETYGLIDE (NEEDLE) IMPLANT
NS IRRIG 1000ML POUR BTL (IV SOLUTION) ×2 IMPLANT
PACK C SECTION WH (CUSTOM PROCEDURE TRAY) ×2 IMPLANT
PAD OB MATERNITY 4.3X12.25 (PERSONAL CARE ITEMS) ×2 IMPLANT
PENCIL SMOKE EVAC W/HOLSTER (ELECTROSURGICAL) ×2 IMPLANT
RTRCTR C-SECT PINK 25CM LRG (MISCELLANEOUS) ×2 IMPLANT
STRIP CLOSURE SKIN 1/2X4 (GAUZE/BANDAGES/DRESSINGS) IMPLANT
SUT CHROMIC 0 CTX 36 (SUTURE) IMPLANT
SUT MON AB 4-0 PS1 27 (SUTURE) ×2 IMPLANT
SUT PLAIN 0 NONE (SUTURE) IMPLANT
SUT PLAIN 2 0 XLH (SUTURE) ×2 IMPLANT
SUT VIC AB 0 CTX 36 (SUTURE) ×6
SUT VIC AB 0 CTX36XBRD ANBCTRL (SUTURE) ×6 IMPLANT
SUT VIC AB 2-0 CT1 27 (SUTURE) ×1
SUT VIC AB 2-0 CT1 TAPERPNT 27 (SUTURE) ×1 IMPLANT
TOWEL OR 17X24 6PK STRL BLUE (TOWEL DISPOSABLE) ×2 IMPLANT
TRAY FOLEY BAG SILVER LF 14FR (SET/KITS/TRAYS/PACK) IMPLANT

## 2017-02-05 NOTE — Progress Notes (Signed)
S: Pt in recovery.  Discussed pt insulin during pregnancy.  Pt was on Novalin N 12 before breakfast and 16 u before dinner. PCR reviewed  .79 A:Pre eclampsia  P: Start Magnesium Sulfate 4 gm loading than 2 Gm per hour. Dr. Alwyn Pea aware

## 2017-02-05 NOTE — Anesthesia Procedure Notes (Signed)
Spinal  Patient location during procedure: OR Staffing Anesthesiologist: Lyndle Herrlich, MD Spinal Block Patient position: sitting Prep: DuraPrep Patient monitoring: heart rate, blood pressure and continuous pulse ox Approach: right paramedian Location: L3-4 Injection technique: single-shot Needle Needle type: Sprotte  Needle gauge: 24 G Needle length: 9 cm Assessment Sensory level: T4 Additional Notes Spinal Dosage in OR  .75% Bupivicaine ml       1.7     [id number]   mcg        100    Fentanyl mcg            25

## 2017-02-05 NOTE — Brief Op Note (Signed)
02/05/2017  6:22 AM  PATIENT:  Julia Willis  27 y.o. female  PRE-OPERATIVE DIAGNOSIS:  IUP @ 39 0/7 weeks,  Fetal Intolerence to Labor, Nonreassuring fetal tracing, Type I DM, Gestational HTN vs. Preeclampsia  POST-OPERATIVE DIAGNOSIS:  Same, Macrosomic infant  PROCEDURE:  Procedure(s): CESAREAN SECTION (N/A), Primary LTCS  SURGEON:  Surgeon(s) and Role:    Thurnell Lose, MD - Primary  PHYSICIAN ASSISTANT:   ASSISTANTS: Dannielle Burn, CNM, Technician   ANESTHESIA:   spinal  EBL:  936 mL   BLOOD ADMINISTERED:none  DRAINS: Urinary Catheter (Foley)   LOCAL MEDICATIONS USED:  NONE  SPECIMEN:  Source of Specimen:  Placenta  DISPOSITION OF SPECIMEN:  PATHOLOGY  COUNTS:  YES  TOURNIQUET:  * No tourniquets in log *  DICTATION: .Other Dictation: Dictation Number [id number]  PLAN OF CARE: Transfer to Postpartum  PATIENT DISPOSITION:  PACU - hemodynamically stable.   Delay start of Pharmacological VTE agent (>24hrs) due to surgical blood loss or risk of bleeding: yes

## 2017-02-05 NOTE — Transfer of Care (Signed)
Immediate Anesthesia Transfer of Care Note  Patient: Julia Willis  Procedure(s) Performed: CESAREAN SECTION (N/A )  Patient Location: PACU  Anesthesia Type:Spinal  Level of Consciousness: awake  Airway & Oxygen Therapy: Patient Spontanous Breathing  Post-op Assessment: Report given to RN and Post -op Vital signs reviewed and stable  Post vital signs: stable  Last Vitals:  Vitals:   02/05/17 0225 02/05/17 0400  BP: (!) 137/98 (!) 147/106  Pulse: 91 88  Resp: 18 16  Temp:      Last Pain:  Vitals:   02/05/17 0400  TempSrc:   PainSc: 0-No pain         Complications: No apparent anesthesia complications

## 2017-02-05 NOTE — Progress Notes (Signed)
Placing pt on monitor, RN at bedside to place IV. Pt anxious about admission, will retake BP when calm.

## 2017-02-05 NOTE — Anesthesia Postprocedure Evaluation (Signed)
Anesthesia Post Note  Patient: Julia Willis  Procedure(s) Performed: CESAREAN SECTION (N/A )     Patient location during evaluation: Mother Baby Anesthesia Type: Spinal Level of consciousness: awake and alert and oriented Pain management: satisfactory to patient Vital Signs Assessment: post-procedure vital signs reviewed and stable Respiratory status: spontaneous breathing and nonlabored ventilation Cardiovascular status: stable Postop Assessment: no headache, no backache, patient able to bend at knees, no signs of nausea or vomiting and adequate PO intake Anesthetic complications: no    Last Vitals:  Vitals:   02/05/17 1210 02/05/17 1211  BP: 122/75   Pulse: (!) 114   Resp: 18   Temp: (!) 36.2 C   SpO2: 98% 98%    Last Pain:  Vitals:   02/05/17 1210  TempSrc: Oral  PainSc:    Pain Goal: Patients Stated Pain Goal: 3 (02/05/17 0815)               Willa Rough

## 2017-02-05 NOTE — Anesthesia Preprocedure Evaluation (Signed)
Anesthesia Evaluation  Patient identified by MRN, date of birth, ID band Patient awake    Reviewed: Allergy & Precautions, H&P , Patient's Chart, lab work & pertinent test results, reviewed documented beta blocker date and time   Airway Mallampati: II  TM Distance: >3 FB Neck ROM: full    Dental no notable dental hx.    Pulmonary    Pulmonary exam normal breath sounds clear to auscultation       Cardiovascular Exercise Tolerance: Good  Rhythm:regular Rate:Normal     Neuro/Psych    GI/Hepatic   Endo/Other  diabetes, GestationalMorbid obesity  Renal/GU      Musculoskeletal   Abdominal   Peds  Hematology   Anesthesia Other Findings   Reproductive/Obstetrics                             Anesthesia Physical Anesthesia Plan  ASA: III and emergent  Anesthesia Plan: Spinal   Post-op Pain Management:    Induction:   PONV Risk Score and Plan:   Airway Management Planned:   Additional Equipment:   Intra-op Plan:   Post-operative Plan:   Informed Consent: I have reviewed the patients History and Physical, chart, labs and discussed the procedure including the risks, benefits and alternatives for the proposed anesthesia with the patient or authorized representative who has indicated his/her understanding and acceptance.   Dental Advisory Given  Plan Discussed with: CRNA and Surgeon  Anesthesia Plan Comments: (  )        Anesthesia Quick Evaluation

## 2017-02-05 NOTE — Progress Notes (Signed)
Patient ID: Julia Willis, female   DOB: 06/01/1990, 27 y.o.   MRN: [id number]   Notified by Fredia Sorrow that pt needed a stat C-section for a Category III tracing.Pt presented for IOL due to Type I DM.  Decreased variability from admission noted. CNM instructed to call in house doctor to get started while I was en route. I did not review tracing prior to departure due to emergent nature. Upon arrival, pt in OR with FSE in place.  FHTs normal. State changed to urgent b/c tracing had improved.   Dr. Kennon Rounds in room reporting decreased variability but did not feel emergent delivery needed.  Introduced myself to patient and briefly reviewed R/B/A.  CNM reported she had counseled pt prior to my arrival.

## 2017-02-05 NOTE — Progress Notes (Signed)
Pt tearful and upset after speaking with neonatologist. Pt declined to speak with the in house chaplin at this time. Emotional support provided by RN and NT.

## 2017-02-05 NOTE — Progress Notes (Signed)
Subjective: Pt comfortable, called to room by RN to evaluate strip.  Pre eclamptic work up pending.  Pt denies headache.  Finger stick 108.   Objective: BP (!) 129/97 (BP Location: Left Arm)   Pulse 100   Temp 97.7 F (36.5 C) (Oral)   Resp 18   Ht 5\' 7"  (1.702 m)   Wt 107 kg (235 lb 14.4 oz)   LMP 05/08/2016 (Exact Date)   SpO2 100%   Breastfeeding? Unknown   BMI 36.95 kg/m  No intake/output data recorded. Total I/O In: 2400 [I.V.:2400] Out: 0174 [Urine:250; Blood:936]  FHT: FHTs BL 140 variability minimal noted to have prolonged late decelerations at times UC:   irregular, every 3-4 minutes SVE:   Dilation: 4 Effacement (%): 80 Station: -2 Exam by:: N Amaira Safley CNM SVE with AROM clear fluid.  Scalp applied and IUPC placed.  Position changes done with no variablity still present.  At 0430 discussed with pt that EFM strip was nonreassuring and it would be best to proceed with a LTCS.  Dr. Simona Huh called to come to labor and delivery.  Urgent LTCS called so Dr. Kennon Rounds in to see pt to proceed with LTCS. Assessment:  27yo G2P0010 at 39 weeks with DM type 2 on insulin Thrombocytosis Cat 3 strip   Plan: Risk and benefits discussed with pt regarding LTCS.  Pt verbalized understanding.  Husband in room with pt.  Starla Link CNM, MSN 02/05/2017, 6:30 AM

## 2017-02-05 NOTE — Progress Notes (Signed)
Tonye Royalty, CNM to notify of patient arrival. FHR variability and interventions reported. CNM will review strip and orders received for RN to place cytotec.

## 2017-02-06 LAB — URINE CULTURE: Culture: <10000 — AB

## 2017-02-06 LAB — COMPREHENSIVE METABOLIC PANEL
ALK PHOS: 131 U/L — AB (ref 38–126)
ALT: 13 U/L — ABNORMAL LOW (ref 14–54)
AST: 23 U/L (ref 15–41)
Albumin: 2.1 g/dL — ABNORMAL LOW (ref 3.5–5.0)
Anion gap: 6 (ref 5–15)
BUN: <5 mg/dL — ABNORMAL LOW (ref 6–20)
CALCIUM: 7.1 mg/dL — AB (ref 8.9–10.3)
CHLORIDE: 101 mmol/L (ref 101–111)
CO2: 23 mmol/L (ref 22–32)
CREATININE: 0.47 mg/dL (ref 0.44–1.00)
GFR calc non Af Amer: >60 mL/min (ref >60)
Glucose, Bld: 186 mg/dL — ABNORMAL HIGH (ref 65–99)
Potassium: 4.1 mmol/L (ref 3.5–5.1)
SODIUM: 130 mmol/L — AB (ref 135–145)
Total Bilirubin: 0.3 mg/dL (ref 0.3–1.2)
Total Protein: 5.7 g/dL — ABNORMAL LOW (ref 6.5–8.1)

## 2017-02-06 LAB — CBC
HCT: 27.4 % — ABNORMAL LOW (ref 36.0–46.0)
HEMOGLOBIN: 8.8 g/dL — AB (ref 12.0–15.0)
MCH: 25.4 pg — AB (ref 26.0–34.0)
MCHC: 32.1 g/dL (ref 30.0–36.0)
MCV: 79.2 fL (ref 78.0–100.0)
PLATELETS: 389 10*3/uL (ref 150–400)
RBC: 3.46 MIL/uL — ABNORMAL LOW (ref 3.87–5.11)
RDW: 14.5 % (ref 11.5–15.5)
WBC: 7.6 10*3/uL (ref 4.0–10.5)

## 2017-02-06 LAB — GLUCOSE, CAPILLARY
GLUCOSE-CAPILLARY: 115 mg/dL — AB (ref 65–99)
GLUCOSE-CAPILLARY: 97 mg/dL (ref 65–99)
GLUCOSE-CAPILLARY: 74 mg/dL (ref 65–99)
Glucose-Capillary: 156 mg/dL — ABNORMAL HIGH (ref 65–99)

## 2017-02-06 MED ORDER — OXYCODONE-ACETAMINOPHEN 5-325 MG PO TABS: 1.0000 | ORAL_TABLET | Freq: Four times a day (QID) | ORAL | 0 refills | Status: DC | PRN

## 2017-02-06 MED ORDER — IBUPROFEN 600 MG PO TABS: 600.0000 mg | ORAL_TABLET | Freq: Four times a day (QID) | ORAL | 1 refills | Status: DC | PRN

## 2017-02-06 MED ORDER — METFORMIN HCL 500 MG PO TABS: 500.0000 mg | ORAL_TABLET | Freq: Two times a day (BID) | ORAL | 1 refills | Status: DC

## 2017-02-06 MED ORDER — METFORMIN HCL 500 MG PO TABS
500.0000 mg | ORAL_TABLET | Freq: Two times a day (BID) | ORAL | Status: DC
  Administered 2017-02-06: 500 mg via ORAL
  Filled 2017-02-06 (×2): qty 1

## 2017-02-06 NOTE — Discharge Summary (Addendum)
Obstetric Discharge Summary Reason for Admission: induction of labor Prenatal Procedures: ultrasound Intrapartum Procedures: c-section Postpartum Procedures: magnesium sulfate x [id number] Complications-Operative and Postpartum: none Hemoglobin  Date Value Ref Range Status  02/06/2017 8.8 (L) 12.0 - 15.0 g/dL Final   HCT  Date Value Ref Range Status  02/06/2017 27.4 (L) 36.0 - 46.0 % Final    Physical Exam:  General: alert and no distress Lochia: appropriate Uterine Fundus: firm Incision: dressing c/d/i DVT Evaluation: No evidence of DVT seen on physical exam.  Discharge Diagnoses: Term Pregnancy-delivered s/p Mg fpr Preeclampsia, T2DM  Discharge Information: Date: 02/06/2017 Activity: pelvic rest Diet: routine diabetic diet Medications: Ibuprofen and Percocet Condition: stable Instructions: refer to practice specific booklet Discharge to: home Follow-up Information    Everett Graff, MD Follow up on 02/09/2017.   Specialty:  Obstetrics and Gynecology Why:  Call to be seen on Friday Contact information: [address] [place] [address] [phone]           Newborn Data: Live born female  Birth Weight: 9 lb 15.8 oz (4530 g) APGAR: 1, 3  Newborn Delivery   Birth date/time:  02/05/2017 05:08:00 Delivery type:  C-Section, Vacuum Assisted C-section categorization:  Primary    D/C instructions reviewed.  Check temp bid and if > 100.4, call doctor.  If any HAs, visual changes or abdominal pain, call doctor.  Pt instructed to be seen in the office before the weekend.  Home with NICU then transferred to [facility].  Delice Lesch 02/06/2017, 9:45 PM

## 2017-02-06 NOTE — Op Note (Signed)
NAMEANWAR, CRILL                ACCOUNT NO.:  [id number]  MEDICAL RECORD NO.:  [id number]  LOCATION:  9302                          FACILITY:  [facility]  PHYSICIAN:  Jola Schmidt, MD   DATE OF BIRTH:  [birth year]  DATE OF PROCEDURE:  02/05/2017 DATE OF DISCHARGE:                              OPERATIVE REPORT   PREOPERATIVE DIAGNOSES:  Intrauterine pregnancy at 74 and 0/7th weeks, fetal intolerance of labor, non-reassuring fetal heart tracing, type 1 diabetes, and gestational hypertension versus preeclampsia.  POSTOPERATIVE DIAGNOSES:  Intrauterine pregnancy at 45 and 0/7th weeks, fetal intolerance of labor, non-reassuring fetal heart tracing, type 1 diabetes, and gestational hypertension versus preeclampsia, macrosomic infant.  PROCEDURE:  Primary low-transverse cesarean section with 2-layer closure.  SURGEONS:  Jola Schmidt, MD.  ASSISTANT:  Daphene Calamity, certified nurse midwife, technician.  ANESTHESIA:  Spinal.  ESTIMATED BLOOD LOSS:  936 mL.  BLOOD ADMINISTERED:  None.  DRAINS:  Foley catheter.  SPECIMEN:  Source of placenta is placenta.  Disposition of specimen is to Pathology.  DISPOSITION:  To PACU, hemodynamically stable.  FINDINGS:  Female in the vertex position.  Final weight was 10 pounds. Normal uterus, fallopian tubes, and ovaries bilaterally.  INDICATIONS FOR PROCEDURE:  Please see progress note in chart.  PROCEDURE IN DETAIL:  Julia Willis was identified in the holding area.  She was then taken to the operating room with IV running and urgent C- section had been called.  Fetal scalp electrode was in place until the patient was prepped, fetal heart tones were stable.  I briefly consented the patient after the certified nurse midwife had done so also.  Due to the emergent nature of the C-section, the abdomen was prepped with Betadine.  A time-out was performed.  SCDs were on her legs.  Ancef 2 g IV had been administered due to a large edematous pannus.   The traxi was applied for better visualization.  Abdomen was marked after adequate anesthesia was confirmed.  A scalpel was used to open the subcutaneous space down to the underlying layer of the fascia.  It was then incised at the midline with the Bovie and extended laterally.  The superior of the lower edge of the fascia was then transected off the rectus muscles down to the symphysis pubis with the curved Mayo scissors.  The same was done above.  Adequate fascial extension was noted.  The muscles were then separated at the midline and entered sharply with the hemostat.  The abdomen was then stretched with a tight band from the peritoneum, which was then extended.  Abdomen was then stretched and an Alexis retractor was then placed after the intraabdominal cavity was palpated.  The Alexis retractor was then rolled down.  The lower uterine segment was well visualized.  The serosa was incised with the Metzenbaum scissors and extended laterally with the curved Mayo scissors.  Bladder flap was developed.  A transverse incision was then made in the lower uterine segment and extended with the bandage scissors.  Upon opening the uterus, there was minimal amount of fluid.  There was no blood noted.  The baby, the right ear was noted in the hysterotomy  incision.  The head was brought to the incision.  Due to the asynclitic nature of it, I needed to use a vacuum to assist positioning the head to allow for adequate delivery.  The head was delivered.  A nuchal cord was noted and it was easily reduced.  When I attempted to deliver the shoulders, there was some resistance.  Due to the incision, I had concern that the incision was adequate in hindsight it was appropriate.  The fundal pressure was difficult due to the patient habitus.  Eventually, we were able to deliver well.  After the head was out, I attempted to deliver the anterior shoulder, but the posterior shoulder was stuck.  Then, through a  series of manipulations and trying to deliver the posterior shoulder that was unsuccessful, I eventually delivered the anterior shoulder and the baby was delivered.  A floppy cone was noted.  The cord was clamped x2 and cut and baby was handed off to the awaiting NICU team.  The placenta was removed without difficulty.  No sign of abruption.  The small bowel had also herniated through the right side of the incision.  Two moistened laparotomy sponges with tags were then placed on the sponges and placed back into the intraabdominal cavity.  The incision was then reapproximated with 2-0 Vicryl in a continuous locked fashion.  A second layer of the same suture was then used.  The bladder flap appeared to be a little thick and it looked like the serosa had been incised, so I did a layer reapproximating the serosa and there was, looks like, a small nick on the anterior portion of the uterus that was also reapproximated with 3-0 Vicryl and it was hemostatic.  The gutters were cleared of all clots and debris with copious irrigation. Interceed was then applied.  The peritoneum was approximated with 2-0 Vicryl in a continuous locked fashion.  The fascia was reapproximated with 0 Vicryl x2.  The subcutaneous space was closed with 2-0 plain gut and the skin was reapproximated with 4-0 Monocryl.  A PICO dressing was applied due to the patient's history of type 1 diabetes and also due to her large pannus.  She was at increased risk of wound dehiscence.  All instrument, sponge, and needle counts were correct x3.  The patient was taken to the recovery room in stable condition.  Shortly after returning to the recovery room, the patient was diagnosed with preeclampsia and started on magnesium.Jola Schmidt, MD     EBV/MEDQ  D:  02/06/2017  T:  02/06/2017  Job:  [id number]

## 2017-02-06 NOTE — Progress Notes (Signed)
Discharge instructions and prescriptions reviewed with pt and spouse. Pt had no questions upon request. VS WDL. Discharged via wheelchair.

## 2017-02-06 NOTE — Progress Notes (Signed)
Subjective: Postpartum Day 1: Cesarean Delivery Patient reports tolerating PO, + flatus and no problems voiding.  Denies HA, visual changes or abdominal pain.  Bleeding is minimal.  Pt and husband emotionally doing as well as expected.  Baby has been transferred and they have been in contact with the providers and are hopeful.  FOB verbalized he doesn't want the baby to suffer.  They shared their experience, thoughts and feelings and questions answered.  Objective: Vital signs in last 24 hours: Temp:  [97.5 F (36.4 C)-98.6 F (37 C)] 98.1 F (36.7 C) (01/29 1200) Pulse Rate:  [98-103] 98 (01/29 1200) Resp:  [17-18] 18 (01/29 1200) BP: (117-130)/(71-90) 128/86 (01/29 1200) SpO2:  [98 %-100 %] 98 % (01/29 1200)  Physical Exam:  General: alert and no distress Lochia: appropriate Uterine Fundus: firm Incision: dressing c/d/i DVT Evaluation: No evidence of DVT seen on physical exam.  Recent Labs    02/05/17 0115 02/06/17 0456  HGB 10.8* 8.8*  HCT 34.4* 27.4*    Assessment/Plan: Status post Cesarean section. Doing well postoperatively.  Continue current care Routine Post Op Care SCDs for DVT Encourage IS T2DM - on metformin prior to pregnancy.  Will restart BPs stable - Mg d/c'd this morning.  Will cont to observe. Voiding without difficulty Ambulating without difficulty  Delice Lesch 02/06/2017, 2:25 PM

## 2017-05-15 DIAGNOSIS — Z8759 Personal history of other complications of pregnancy, childbirth and the puerperium: Secondary | ICD-10-CM | POA: Insufficient documentation

## 2017-05-15 DIAGNOSIS — Z98891 History of uterine scar from previous surgery: Secondary | ICD-10-CM | POA: Insufficient documentation

## 2017-05-15 LAB — OB RESULTS CONSOLE GC/CHLAMYDIA
CHLAMYDIA, DNA PROBE: NEGATIVE
Gonorrhea: NEGATIVE

## 2017-05-15 LAB — OB RESULTS CONSOLE HIV ANTIBODY (ROUTINE TESTING): HIV: NONREACTIVE

## 2017-05-15 LAB — OB RESULTS CONSOLE RUBELLA ANTIBODY, IGM: Rubella: IMMUNE

## 2017-05-15 LAB — OB RESULTS CONSOLE ABO/RH: RH Type: POSITIVE

## 2017-05-15 LAB — OB RESULTS CONSOLE ANTIBODY SCREEN: ANTIBODY SCREEN: NEGATIVE

## 2017-05-15 LAB — OB RESULTS CONSOLE RPR: RPR: NONREACTIVE

## 2017-05-15 LAB — OB RESULTS CONSOLE HEPATITIS B SURFACE ANTIGEN: Hepatitis B Surface Ag: NEGATIVE

## 2017-05-28 ENCOUNTER — Encounter (HOSPITAL_COMMUNITY): Payer: Self-pay

## 2017-05-28 ENCOUNTER — Inpatient Hospital Stay (HOSPITAL_COMMUNITY)
Admission: AD | Admit: 2017-05-28 | Discharge: 2017-05-28 | Disposition: A | Discharge status: Home / Self Care | Payer: Medicaid Other | Source: Ambulatory Visit | Attending: Obstetrics & Gynecology | Admitting: Obstetrics & Gynecology

## 2017-05-28 DIAGNOSIS — O24911 Unspecified diabetes mellitus in pregnancy, first trimester: Secondary | ICD-10-CM | POA: Insufficient documentation

## 2017-05-28 DIAGNOSIS — O34219 Maternal care for unspecified type scar from previous cesarean delivery: Secondary | ICD-10-CM | POA: Diagnosis not present

## 2017-05-28 DIAGNOSIS — O0991 Supervision of high risk pregnancy, unspecified, first trimester: Secondary | ICD-10-CM

## 2017-05-28 DIAGNOSIS — Z8249 Family history of ischemic heart disease and other diseases of the circulatory system: Secondary | ICD-10-CM | POA: Diagnosis not present

## 2017-05-28 DIAGNOSIS — E1165 Type 2 diabetes mellitus with hyperglycemia: Secondary | ICD-10-CM | POA: Insufficient documentation

## 2017-05-28 DIAGNOSIS — Z791 Long term (current) use of non-steroidal anti-inflammatories (NSAID): Secondary | ICD-10-CM | POA: Insufficient documentation

## 2017-05-28 DIAGNOSIS — Z3A11 11 weeks gestation of pregnancy: Secondary | ICD-10-CM | POA: Diagnosis not present

## 2017-05-28 DIAGNOSIS — Z833 Family history of diabetes mellitus: Secondary | ICD-10-CM | POA: Insufficient documentation

## 2017-05-28 DIAGNOSIS — T383X6A Underdosing of insulin and oral hypoglycemic [antidiabetic] drugs, initial encounter: Secondary | ICD-10-CM | POA: Diagnosis not present

## 2017-05-28 DIAGNOSIS — Z7984 Long term (current) use of oral hypoglycemic drugs: Secondary | ICD-10-CM | POA: Insufficient documentation

## 2017-05-28 DIAGNOSIS — O9A211 Injury, poisoning and certain other consequences of external causes complicating pregnancy, first trimester: Secondary | ICD-10-CM | POA: Diagnosis not present

## 2017-05-28 DIAGNOSIS — Z79891 Long term (current) use of opiate analgesic: Secondary | ICD-10-CM | POA: Insufficient documentation

## 2017-05-28 DIAGNOSIS — E1100 Type 2 diabetes mellitus with hyperosmolarity without nonketotic hyperglycemic-hyperosmolar coma (NKHHC): Secondary | ICD-10-CM

## 2017-05-28 DIAGNOSIS — Z79899 Other long term (current) drug therapy: Secondary | ICD-10-CM | POA: Insufficient documentation

## 2017-05-28 DIAGNOSIS — Z809 Family history of malignant neoplasm, unspecified: Secondary | ICD-10-CM | POA: Diagnosis not present

## 2017-05-28 LAB — GLUCOSE, CAPILLARY: Glucose-Capillary: 149 mg/dL — ABNORMAL HIGH (ref 65–99)

## 2017-05-28 NOTE — MAU Note (Signed)
Pt sent from office for elevated blood sugar. States at 5:15p it was 179. States she is T2DM.  States she was taking Metformin, but does not have any at home. Has not taken it in about 3 days. Pt here for "peace of mind" because of her history of 1 miscarriage at 14 weeks and 1 full term loss in January. Pt is not complaining of any symptoms, such as HA, vomiting, etc.

## 2017-05-28 NOTE — Discharge Instructions (Signed)
Diabetes Mellitus and Nutrition When you have diabetes (diabetes mellitus), it is very important to have healthy eating habits because your blood sugar (glucose) levels are greatly affected by what you eat and drink. Eating healthy foods in the appropriate amounts, at about the same times every day, can help you:  Control your blood glucose.  Lower your risk of heart disease.  Improve your blood pressure.  Reach or maintain a healthy weight.  Every person with diabetes is different, and each person has different needs for a meal plan. Your health care provider may recommend that you work with a diet and nutrition specialist (dietitian) to make a meal plan that is best for you. Your meal plan may vary depending on factors such as:  The calories you need.  The medicines you take.  Your weight.  Your blood glucose, blood pressure, and cholesterol levels.  Your activity level.  Other health conditions you have, such as heart or kidney disease.  How do carbohydrates affect me? Carbohydrates affect your blood glucose level more than any other type of food. Eating carbohydrates naturally increases the amount of glucose in your blood. Carbohydrate counting is a method for keeping track of how many carbohydrates you eat. Counting carbohydrates is important to keep your blood glucose at a healthy level, especially if you use insulin or take certain oral diabetes medicines. It is important to know how many carbohydrates you can safely have in each meal. This is different for every person. Your dietitian can help you calculate how many carbohydrates you should have at each meal and for snack. Foods that contain carbohydrates include:  Bread, cereal, rice, pasta, and crackers.  Potatoes and corn.  Peas, beans, and lentils.  Milk and yogurt.  Fruit and juice.  Desserts, such as cakes, cookies, ice cream, and candy.  How does alcohol affect me? Alcohol can cause a sudden decrease in blood  glucose (hypoglycemia), especially if you use insulin or take certain oral diabetes medicines. Hypoglycemia can be a life-threatening condition. Symptoms of hypoglycemia (sleepiness, dizziness, and confusion) are similar to symptoms of having too much alcohol. If your health care provider says that alcohol is safe for you, follow these guidelines:  Limit alcohol intake to no more than 1 drink per day for nonpregnant women and 2 drinks per day for men. One drink equals 12 oz of beer, 5 oz of wine, or 1 oz of hard liquor.  Do not drink on an empty stomach.  Keep yourself hydrated with water, diet soda, or unsweetened iced tea.  Keep in mind that regular soda, juice, and other mixers may contain a lot of sugar and must be counted as carbohydrates.  What are tips for following this plan? Reading food labels  Start by checking the serving size on the label. The amount of calories, carbohydrates, fats, and other nutrients listed on the label are based on one serving of the food. Many foods contain more than one serving per package.  Check the total grams (g) of carbohydrates in one serving. You can calculate the number of servings of carbohydrates in one serving by dividing the total carbohydrates by 15. For example, if a food has 30 g of total carbohydrates, it would be equal to 2 servings of carbohydrates.  Check the number of grams (g) of saturated and trans fats in one serving. Choose foods that have low or no amount of these fats.  Check the number of milligrams (mg) of sodium in one serving. Most people  should limit total sodium intake to less than 2,300 mg per day.  Always check the nutrition information of foods labeled as "low-fat" or "nonfat". These foods may be higher in added sugar or refined carbohydrates and should be avoided.  Talk to your dietitian to identify your daily goals for nutrients listed on the label. Shopping  Avoid buying canned, premade, or processed foods. These  foods tend to be high in fat, sodium, and added sugar.  Shop around the outside edge of the grocery store. This includes fresh fruits and vegetables, bulk grains, fresh meats, and fresh dairy. Cooking  Use low-heat cooking methods, such as baking, instead of high-heat cooking methods like deep frying.  Cook using healthy oils, such as olive, canola, or sunflower oil.  Avoid cooking with butter, cream, or high-fat meats. Meal planning  Eat meals and snacks regularly, preferably at the same times every day. Avoid going long periods of time without eating.  Eat foods high in fiber, such as fresh fruits, vegetables, beans, and whole grains. Talk to your dietitian about how many servings of carbohydrates you can eat at each meal.  Eat 4-6 ounces of lean protein each day, such as lean meat, chicken, fish, eggs, or tofu. 1 ounce is equal to 1 ounce of meat, chicken, or fish, 1 egg, or 1/4 cup of tofu.  Eat some foods each day that contain healthy fats, such as avocado, nuts, seeds, and fish. Lifestyle   Check your blood glucose regularly.  Exercise at least 30 minutes 5 or more days each week, or as told by your health care provider.  Take medicines as told by your health care provider.  Do not use any products that contain nicotine or tobacco, such as cigarettes and e-cigarettes. If you need help quitting, ask your health care provider.  Work with a Social worker or diabetes educator to identify strategies to manage stress and any emotional and social challenges. What are some questions to ask my health care provider?  Do I need to meet with a diabetes educator?  Do I need to meet with a dietitian?  What number can I call if I have questions?  When are the best times to check my blood glucose? Where to find more information:  American Diabetes Association: diabetes.org/food-and-fitness/food  Academy of Nutrition and Dietetics:  PokerClues.dk  [employer] of Diabetes and Digestive and Kidney Diseases (NIH): ContactWire.be Summary  A healthy meal plan will help you control your blood glucose and maintain a healthy lifestyle.  Working with a diet and nutrition specialist (dietitian) can help you make a meal plan that is best for you.  Keep in mind that carbohydrates and alcohol have immediate effects on your blood glucose levels. It is important to count carbohydrates and to use alcohol carefully. This information is not intended to replace advice given to you by your health care provider. Make sure you discuss any questions you have with your health care provider. Document Released: 09/22/2004 Document Revised: 01/31/2016 Document Reviewed: 01/31/2016 Elsevier Interactive Patient Education  2018 [place] With Diabetes Diabetes (type 1 diabetes mellitus or type 2 diabetes mellitus) is a condition in which the body does not have enough of a hormone called insulin, or the body does not respond properly to insulin. Normally, insulin allows sugars (glucose) to enter cells in the body. The cells use glucose for energy. With diabetes, extra glucose builds up in the blood instead of going into cells, which results in high blood  glucose (hyperglycemia). How to manage lifestyle changes Managing diabetes includes medical treatments as well as lifestyle changes. If diabetes is not managed well, serious physical and emotional complications can occur. Taking good care of yourself means that you are responsible for:  Monitoring glucose regularly.  Eating a healthy diet.  Exercising regularly.  Meeting with health care providers.  Taking medicines as directed.  Some people may feel a lot of stress about managing their diabetes. This is known as emotional distress, and it is very  common. Living with diabetes can place you at risk for emotional distress, depression, or anxiety. These disorders can be confusing and can make diabetes management more difficult. How to recognize stress Emotional distress Symptoms of emotional distress include:  Anger about having a diagnosis of diabetes.  Fear or frustration about your diagnosis and the changes you need to make to manage the condition.  Being overly worried about the care that you need or the cost of the care you need.  Feeling like you caused your condition by doing something wrong.  Fear of unpredictable situations, like low or high blood glucose.  Feeling judged by your health care providers.  Feeling very alone with the disease.  Getting too tired or "burned out" with the demands of daily care.  Depression Having diabetes means that you are at a higher risk for depression. Having depression also means that you are at a higher risk for diabetes. Your health care provider may test (screen) you for symptoms of depression. It is important to recognize depression symptoms and to start treatment for it soon after it is diagnosed. The following are some symptoms of depression:  Loss of interest in things that you used to enjoy.  Trouble sleeping, or often waking up early and not being able to get back to sleep.  A change in appetite.  Feeling tired most of the day.  Feeling nervous and anxious.  Feeling guilty and worrying that you are a burden to others.  Feeling depressed more often than you do not feel that way.  Thoughts of hurting yourself or feeling that you want to die.  If you have any of these symptoms for 2 weeks or longer, reach out to a health care provider. Where to find support  Ask your health care provider to recommend a therapist who understands both depression and diabetes.  Search for information and support from the American Diabetes Association: www.diabetes.org  Find a certified  diabetes educator and make an appointment through [place] of Diabetes Educators: www.diabeteseducator.org Follow these instructions at home: Managing emotional distress The following are some ways to manage emotional distress:  Talk with your health care provider or certified diabetes educator. Consider working with a counselor or therapist.  Learn as much as you can about diabetes and its treatment. Meet with a certified diabetes educator or take a class to learn how to manage your condition.  Keep a journal of your thoughts and concerns.  Accept that some things are out of your control.  Talk with other people who have diabetes. It can help to talk with others about the emotional distress that you feel.  Find ways to manage stress that work for you. These may include art or music therapy, exercise, meditation, and hobbies.  Seek support from spiritual leaders, family, and friends.  General instructions  Follow your diabetes management plan.  Keep all follow-up visits as told by your health care provider. This is important. Get help right away if:  You have thoughts about hurting yourself or others. If you ever feel like you may hurt yourself or others, or have thoughts about taking your own life, get help right away. You can go to your nearest emergency department or call:  Your local emergency services (911 in the [place]  A suicide crisis helpline, such as the [place] at 571-608-0004. This is open 24 hours a day.  Summary  Diabetes (type 1 diabetes mellitus or type 2 diabetes mellitus) is a condition in which the body does not have enough of a hormone called insulin, or the body does not respond properly to insulin.  Living with diabetes puts you at risk for medical issues, and it also puts you at risk for emotional issues such as emotional distress, depression, and anxiety.  Recognizing the symptoms of emotional distress and  depression may help you avoid problems with your diabetes control. It is important to start treatment for emotional distress and depression soon after they are diagnosed.  Having diabetes means that you are at a higher risk for depression. Ask your health care provider to recommend a therapist who understands both depression and diabetes.  If you experience symptoms of emotional distress or depression, it is important to discuss this with your health care provider, certified diabetes educator, or therapist. This information is not intended to replace advice given to you by your health care provider. Make sure you discuss any questions you have with your health care provider. Document Released: 05/11/2016 Document Revised: 05/11/2016 Document Reviewed: 05/11/2016 Elsevier Interactive Patient Education  2018 [employer].

## 2017-05-28 NOTE — MAU Provider Note (Signed)
History     CSN: [id number]  Arrival date and time: 05/28/17 1845   Chief Complaint  Patient presents with  . Hyperglycemia   Patient presented to MAU because fingerstick BG was 179, 2 hrs after eating chicken wings. Patient is type 2 diabetic controlled with Metformin 500mg  BID. Patient ran out of Metformin 3 days ago. States she has been eating carefully to avoid high blood sugars while out of Metformin but that Metformin had not been working, as she had postprandials of >200 on multiple occassions and >400 once. Patient has appointment for dating U/S tomorrow at [facility] office and is scheduled to see MD to discuss management options for diabetes. Because of previous history of fetal loss and neonatal demise, patient is extremely worried for this pregnancy.    Pertinent Gynecological History: Patient had previous twin loss and postpartum neonatal demise.    Past Medical History:  Diagnosis Date  . Acid reflux   . Diabetes mellitus    metformin  . Gonorrhea June 2013  . Thrombocytosis ([place])     Past Surgical History:  Procedure Laterality Date  . CESAREAN SECTION N/A 02/05/2017   Procedure: CESAREAN SECTION;  Surgeon: Thurnell Lose, MD;  Location: [place];  Service: Obstetrics;  Laterality: N/A;  . NO PAST SURGERIES      Family History  Problem Relation Age of Onset  . Diabetes Mother   . Hypertension Father   . Heart disease Father   . Diabetes Maternal Aunt   . Hypertension Maternal Aunt   . Diabetes Maternal Uncle   . Hypertension Maternal Uncle   . Cancer Maternal Uncle   . Hypertension Paternal Aunt   . Hypertension Paternal Uncle     Social History   Tobacco Use  . Smoking status: Never Smoker  . Smokeless tobacco: Never Used  Substance Use Topics  . Alcohol use: No  . Drug use: No    Allergies: No Known Allergies  Medications Prior to Admission  Medication Sig Dispense Refill Last Dose  . glucose blood (TRUE METRIX BLOOD GLUCOSE TEST)  test strip Use as instructed 100 each 12 08/25/2016 at Unknown time  . ibuprofen (ADVIL,MOTRIN) 600 MG tablet Take 1 tablet (600 mg total) by mouth every 6 (six) hours as needed. 30 tablet 1   . metFORMIN (GLUCOPHAGE) 500 MG tablet Take 1 tablet (500 mg total) by mouth 2 (two) times daily with a meal. 60 tablet 1   . oxyCODONE-acetaminophen (PERCOCET/ROXICET) 5-325 MG tablet Take 1 tablet by mouth every 6 (six) hours as needed (pain scale 4-7). 30 tablet 0     Review of Systems  All other systems reviewed and are negative.  Physical Exam   Vitals:   05/28/17 1940  BP: (!) 132/91  Pulse: 87  Resp: 20  Temp: 98.2 F (36.8 C)  TempSrc: Oral  SpO2: 100%  Weight: 98 kg (216 lb)  Height: 5\' 7"  (1.702 m)   Results for orders placed or performed during the hospital encounter of 05/28/17 (from the past 24 hour(s))  Glucose, capillary     Status: Abnormal   Collection Time: 05/28/17  7:59 PM  Result Value Ref Range   Glucose-Capillary 149 (H) 65 - 99 mg/dL    Physical Exam  Constitutional: She is oriented to person, place, and time. She appears well-developed and well-nourished.  HENT:  Head: Normocephalic and atraumatic.  Eyes: Pupils are equal, round, and reactive to light.  Cardiovascular: Normal rate and regular rhythm.  Respiratory: Effort  normal and breath sounds normal.  GI: Soft. Bowel sounds are normal.  Musculoskeletal: Normal range of motion.  Neurological: She is alert and oriented to person, place, and time.  Skin: Skin is warm and dry.  Psychiatric: She has a normal mood and affect. Her behavior is normal. Judgment and thought content normal.  FHTs not audible by doppler, located by bedside U/S- [facility] FHR 162  MAU Course  Patient with elevated blood glucose but not high enough to warrant insulin now. She has appointment with [facility] U/S and MD tomorrow to assess dating and determine plan of care for diabetes management.    Assessment and Plan  27 y.o. G3P1010 at 11  weeks Type 2 diabetes mellitus  Hyperglycemia Bedside U/S today ADA diet  Keep office appointment tomorrow   Marikay Alar 05/28/2017, 9:26 PM

## 2017-05-29 ENCOUNTER — Other Ambulatory Visit: Payer: Self-pay

## 2017-05-29 ENCOUNTER — Encounter (HOSPITAL_COMMUNITY): Payer: Self-pay

## 2017-05-30 ENCOUNTER — Other Ambulatory Visit: Payer: Self-pay

## 2017-06-01 ENCOUNTER — Encounter (HOSPITAL_COMMUNITY): Payer: Self-pay | Admitting: *Deleted

## 2017-06-05 ENCOUNTER — Ambulatory Visit (HOSPITAL_COMMUNITY)
Admission: RE | Admit: 2017-06-05 | Discharge: 2017-06-05 | Disposition: A | Discharge status: Home / Self Care | Payer: Medicaid Other | Source: Ambulatory Visit | Attending: Obstetrics and Gynecology | Admitting: Obstetrics and Gynecology

## 2017-06-05 HISTORY — DX: Unspecified ovarian cyst, unspecified side: N83.209

## 2017-06-05 HISTORY — DX: Unspecified pre-eclampsia, unspecified trimester: O14.90

## 2017-06-15 ENCOUNTER — Encounter (HOSPITAL_COMMUNITY): Payer: Medicaid Other

## 2017-06-19 ENCOUNTER — Encounter (HOSPITAL_COMMUNITY): Payer: Self-pay

## 2017-06-19 ENCOUNTER — Ambulatory Visit (HOSPITAL_COMMUNITY)
Admission: RE | Admit: 2017-06-19 | Discharge: 2017-06-19 | Disposition: A | Discharge status: Home / Self Care | Payer: Medicaid Other | Source: Ambulatory Visit | Attending: Obstetrics and Gynecology | Admitting: Obstetrics and Gynecology

## 2017-06-19 ENCOUNTER — Other Ambulatory Visit (HOSPITAL_COMMUNITY): Payer: Self-pay | Admitting: *Deleted

## 2017-06-19 ENCOUNTER — Other Ambulatory Visit: Payer: Self-pay

## 2017-06-19 DIAGNOSIS — O24919 Unspecified diabetes mellitus in pregnancy, unspecified trimester: Secondary | ICD-10-CM

## 2017-06-19 DIAGNOSIS — O24111 Pre-existing diabetes mellitus, type 2, in pregnancy, first trimester: Secondary | ICD-10-CM | POA: Insufficient documentation

## 2017-06-19 DIAGNOSIS — Z7984 Long term (current) use of oral hypoglycemic drugs: Secondary | ICD-10-CM | POA: Diagnosis not present

## 2017-06-19 DIAGNOSIS — E119 Type 2 diabetes mellitus without complications: Secondary | ICD-10-CM | POA: Insufficient documentation

## 2017-06-19 NOTE — Consult Note (Signed)
MFM consult, Staff Note:  I spoke with [name] regarding diabetic pregnancy.  I reviewed the classification scheme for Diabetes in Pregnancy, the maternal and fetal/neonatal risks, and the management of pregnancy with DM.  First, we discussed the maternal risks of pregnancy with underlying diabetes.  With regard to the impact of pregnancy on maternal disease, I told her that patients with underlying vasculopathy, especially of the retina and kidneys, appear to fare much worse during pregnancy than women without underlying vascular disease.  Background retinopathy does not seem to be a significant risk factor, although proliferative retinopathy and nephropathy clearly place patients at increased risk, both for poor perinatal outcome and for progression of eye or kidney disease.  I told her that obstetric complications such as pre-eclampsia, sometimes severe and early-onset, are seen with increased frequency in women with insulin-dependent diabetes.  With regard to fetal risk, I detailed the first trimester risks, including miscarriage and fetal malformation.  I told your patient that the miscarriage rate among diabetics appears to be around double that of the non-diabetic population, and that miscarriage risk is most closely related to the degree of glycemic control at conception.  Similarly, the fetal malformation rate is increased in women with diabetes.  With tight preconceptional control, however, the rate of structural malformation appears to be about the same as for the non-diabetic population, approximately two percent.  With moderate preconceptional control, the rate appears to be around five percent.  With poor control, the malformation rate is around ten percent or more.  A hemoglobin A1c value from the first trimester is a reasonably good indicator of early glycemic control; HgbA1c values below about 6% are generally associated with a low risk of fetal malformations.  Accordingly, her recent  HgbA1c of 5.7% confers low risk for fetal malformations.  We discussed the second- and third-trimester risk of abnormal fetal growth, including both growth restriction related to placental insufficiency and macrosomia due to maternal-fetal hyperglycemia.  We also recognized oligohydramnios, stillbirth, fetal distress, meconium, etc., as potential complications.  I also went over the newborn metabolic derangements that can occur with maternal diabetes, including delayed pulmonary maturity and hypoglycemia.  We talked about the tightness of glycemic control that is recommended before and during pregnancy.  I reviewed the usual goals of achieving a HgbA1c value less than 6% before conception and maintaining this level of control throughout gestation.  I detailed the blood sugar targets of having fasting values in the 70-95 mg/dl range, and postprandial values under 120 mg/dl at two hours.  I emphasized the time and effort required to achieve tight control in pregnancy, including the meticulous fingerstick monitoring; close adherence to diet, insulin, and an exercise program; and, compliance with obstetric visits and scheduled fetal testing.  Although she did not bring a glucose log with her for me to review, I verbally reviewed the typical fasting and 2 hour postprandial values as best as she can recall.  She states that while her glucose control has improved, her fasting values remain in 100-110 range and 2 hour postprandial measures are 120-140's.  I increased her NPH from 10 units [place] in the AM and 8 units in the PM to 12 units [place] in the AM and 10 units in the PM.  I also referred her to diabetic education to facilitate a detailed review of her diet and to reinforce the importance of bringing her glucose log into visits so her providers may optimally guide her through pregnancy and keep her glycemic  control within the established target ranges.  I gave the following recommendations to your patient.  She  should begin to work toward tighter control, aiming for the glycemic targets mentioned above.  Given your desire for co-management, I have arranged for follow up MFM consults in conjunction with her regularly scheduled ultrasounds (at 18 weeks for anatomic survey and monthly thereafter).  You should continue her prenatal care and work with her on her glycemic control, speaking with her at least weekly/biweekly and adjusting her insulin dose as needed.  Also, she should have a retinal exam and a 24-hour urine collection, if they have not recently been done.  At around 15-20 weeks, she should have msAFP offered, emphasizing the value of the MSAFP component as a screen for neural tube defects.  I would recommend repeating her NIPS (previously reported as low fetal fraction and likely due to body habitus) at around 16-18 weeks in your office.  At 18 weeks, she is scheduled have a targeted ultrasound exam to most fully evaluate fetal anatomy along with a fetal echocardiogram.  Thereafter, monthly ultrasounds for fetal growth are recommended.  She will also need weekly BPP's beginning at 30-32 weeks.   Given her history of preeclampsia, DM, and obesity, I recommended that she begin aspirin 81mg  po qd to be continued until 37 weeks to reduce risk of recurrent preeclampsia.  A review of her records indicates a negative lupus and antiphospholipid antibody work up.  Finally, assuming all goes well, she should expect to be delivered by or shortly after the completion of 39 weeks, if indication does not arise in the interim.  Given her history of prior low transverse cesarean section x 1, she is a candidate for VBAC.  While she understands her options and risks/benefits associated with VBAC versus repeat cesarean section, she is adamant that she wants a repeat cesarean section given that she does not accept risks of VBAC/uterine rupture/failed VBAC.    Summary of Recommendations: 1.  I increased her NPH from 10 units  [place] in the AM and 8 units in the PM to 12 units [place] in the AM and 10 units in the PM.   2. I also referred her to diabetic education to facilitate a detailed review of her diet and to reinforce the importance of bringing her glucose log into visits so her providers may optimally guide her through pregnancy and keep her glycemic control within the established target ranges.  I have recommended to her a gestational weight gain target of 11-20 pounds by IOM guidelines. 3. Repeat NIPS at 16 weeks or shortly thereafter.  If inadequate fetal fraction is again seen, genetic counseling is then recommended. 4. msAFP at 15-20 weeks to screen for ONTD 5. ASA 81 mg po qd until 37 weeks 6. Fetal survey at 18 weeks 7. Fetal echocardiogram at 18-20 weeks 8. Monthly interval growth ultrasounds 9. Begin antenatal testing at 32 weeks 10. Delivery at 39 weeks or shortly thereafter by repeat cesarean section (declined VBAC)  Time Spent: I spent in excess of 60 minutes in consultation with this patient to review records, evaluate her case, and provide her with an adequate discussion and education.  More than 50% of this time was spent in direct face-to-face counseling.  It was a pleasure seeing your patient in the office today.  Thank you for consultation. Please do not hesitate to contact our service for any further questions.   Delman Cheadle Adriana Mccallum, MD, MS, Cherlynn June  Assistant Professor Section of [place]

## 2017-06-25 ENCOUNTER — Other Ambulatory Visit (HOSPITAL_COMMUNITY): Payer: Medicaid Other

## 2017-07-05 ENCOUNTER — Ambulatory Visit (HOSPITAL_COMMUNITY): Payer: Medicaid Other | Attending: Obstetrics and Gynecology

## 2017-07-10 ENCOUNTER — Encounter (HOSPITAL_COMMUNITY): Payer: Self-pay

## 2017-07-17 ENCOUNTER — Other Ambulatory Visit (HOSPITAL_COMMUNITY): Payer: Medicaid Other

## 2017-07-17 ENCOUNTER — Encounter (HOSPITAL_COMMUNITY): Payer: Medicaid Other

## 2017-07-18 ENCOUNTER — Ambulatory Visit (HOSPITAL_COMMUNITY): Payer: Medicaid Other

## 2017-07-18 ENCOUNTER — Ambulatory Visit (HOSPITAL_COMMUNITY): Admission: RE | Admit: 2017-07-18 | Payer: Medicaid Other | Source: Ambulatory Visit

## 2017-07-30 ENCOUNTER — Ambulatory Visit (HOSPITAL_COMMUNITY)
Admission: RE | Admit: 2017-07-30 | Discharge: 2017-07-30 | Disposition: A | Discharge status: Home / Self Care | Payer: Medicaid Other | Source: Ambulatory Visit | Attending: Obstetrics and Gynecology | Admitting: Obstetrics and Gynecology

## 2017-07-30 ENCOUNTER — Encounter (HOSPITAL_COMMUNITY): Payer: Medicaid Other

## 2017-08-16 ENCOUNTER — Encounter (HOSPITAL_COMMUNITY): Payer: Self-pay

## 2017-08-16 ENCOUNTER — Ambulatory Visit (HOSPITAL_COMMUNITY)
Admission: RE | Admit: 2017-08-16 | Discharge: 2017-08-16 | Disposition: A | Discharge status: Home / Self Care | Payer: Medicaid Other | Source: Ambulatory Visit | Attending: Obstetrics and Gynecology | Admitting: Obstetrics and Gynecology

## 2017-09-12 ENCOUNTER — Encounter (HOSPITAL_COMMUNITY): Payer: Self-pay

## 2017-09-13 ENCOUNTER — Ambulatory Visit (HOSPITAL_COMMUNITY): Admission: RE | Admit: 2017-09-13 | Payer: Medicaid Other | Source: Ambulatory Visit

## 2017-09-19 ENCOUNTER — Other Ambulatory Visit: Payer: Self-pay | Admitting: Obstetrics and Gynecology

## 2017-09-20 ENCOUNTER — Observation Stay (HOSPITAL_COMMUNITY): Payer: Medicaid Other

## 2017-09-20 ENCOUNTER — Other Ambulatory Visit: Payer: Self-pay

## 2017-09-20 ENCOUNTER — Inpatient Hospital Stay (HOSPITAL_COMMUNITY)
Admission: AD | Admit: 2017-09-20 | Discharge: 2017-09-22 | DRG: 833 | Disposition: A | Discharge status: Home / Self Care | Payer: Medicaid Other | Attending: Obstetrics & Gynecology | Admitting: Obstetrics & Gynecology

## 2017-09-20 ENCOUNTER — Observation Stay (HOSPITAL_BASED_OUTPATIENT_CLINIC_OR_DEPARTMENT_OTHER): Payer: Medicaid Other

## 2017-09-20 DIAGNOSIS — Z363 Encounter for antenatal screening for malformations: Secondary | ICD-10-CM

## 2017-09-20 DIAGNOSIS — O24414 Gestational diabetes mellitus in pregnancy, insulin controlled: Secondary | ICD-10-CM | POA: Diagnosis present

## 2017-09-20 DIAGNOSIS — Z3A27 27 weeks gestation of pregnancy: Secondary | ICD-10-CM

## 2017-09-20 DIAGNOSIS — O24112 Pre-existing diabetes mellitus, type 2, in pregnancy, second trimester: Secondary | ICD-10-CM | POA: Diagnosis present

## 2017-09-20 DIAGNOSIS — O99512 Diseases of the respiratory system complicating pregnancy, second trimester: Principal | ICD-10-CM | POA: Diagnosis present

## 2017-09-20 DIAGNOSIS — O99012 Anemia complicating pregnancy, second trimester: Secondary | ICD-10-CM | POA: Diagnosis present

## 2017-09-20 DIAGNOSIS — E1165 Type 2 diabetes mellitus with hyperglycemia: Secondary | ICD-10-CM | POA: Diagnosis present

## 2017-09-20 DIAGNOSIS — O09293 Supervision of pregnancy with other poor reproductive or obstetric history, third trimester: Secondary | ICD-10-CM

## 2017-09-20 DIAGNOSIS — O24312 Unspecified pre-existing diabetes mellitus in pregnancy, second trimester: Secondary | ICD-10-CM | POA: Diagnosis not present

## 2017-09-20 DIAGNOSIS — D509 Iron deficiency anemia, unspecified: Secondary | ICD-10-CM | POA: Diagnosis present

## 2017-09-20 DIAGNOSIS — O368120 Decreased fetal movements, second trimester, not applicable or unspecified: Secondary | ICD-10-CM | POA: Diagnosis present

## 2017-09-20 DIAGNOSIS — Z794 Long term (current) use of insulin: Secondary | ICD-10-CM

## 2017-09-20 DIAGNOSIS — J069 Acute upper respiratory infection, unspecified: Secondary | ICD-10-CM

## 2017-09-20 DIAGNOSIS — E1100 Type 2 diabetes mellitus with hyperosmolarity without nonketotic hyperglycemic-hyperosmolar coma (NKHHC): Secondary | ICD-10-CM

## 2017-09-20 LAB — COMPREHENSIVE METABOLIC PANEL
ALT: 9 U/L (ref 0–44)
ANION GAP: 9 (ref 5–15)
AST: 12 U/L — ABNORMAL LOW (ref 15–41)
Albumin: 2.4 g/dL — ABNORMAL LOW (ref 3.5–5.0)
Alkaline Phosphatase: 74 U/L (ref 38–126)
BUN: 7 mg/dL (ref 6–20)
CHLORIDE: 101 mmol/L (ref 98–111)
CO2: 22 mmol/L (ref 22–32)
CREATININE: 0.39 mg/dL — AB (ref 0.44–1.00)
Calcium: 8.5 mg/dL — ABNORMAL LOW (ref 8.9–10.3)
GFR calc non Af Amer: >60 mL/min (ref >60)
Glucose, Bld: 233 mg/dL — ABNORMAL HIGH (ref 70–99)
POTASSIUM: 3.9 mmol/L (ref 3.5–5.1)
Sodium: 132 mmol/L — ABNORMAL LOW (ref 135–145)
Total Bilirubin: 0.5 mg/dL (ref 0.3–1.2)
Total Protein: 7.1 g/dL (ref 6.5–8.1)

## 2017-09-20 LAB — TYPE AND SCREEN
ABO/RH(D): O POS
ANTIBODY SCREEN: NEGATIVE

## 2017-09-20 LAB — GLUCOSE, CAPILLARY
GLUCOSE-CAPILLARY: 111 mg/dL — AB (ref 70–99)
GLUCOSE-CAPILLARY: 185 mg/dL — AB (ref 70–99)
GLUCOSE-CAPILLARY: 185 mg/dL — AB (ref 70–99)
Glucose-Capillary: 149 mg/dL — ABNORMAL HIGH (ref 70–99)

## 2017-09-20 LAB — CBC
HCT: 27.6 % — ABNORMAL LOW (ref 36.0–46.0)
HEMOGLOBIN: 8.7 g/dL — AB (ref 12.0–15.0)
MCH: 23.3 pg — AB (ref 26.0–34.0)
MCHC: 31.5 g/dL (ref 30.0–36.0)
MCV: 73.8 fL — AB (ref 78.0–100.0)
Platelets: 310 10*3/uL (ref 150–400)
RBC: 3.74 MIL/uL — AB (ref 3.87–5.11)
RDW: 16.4 % — ABNORMAL HIGH (ref 11.5–15.5)
WBC: 8.3 10*3/uL (ref 4.0–10.5)

## 2017-09-20 LAB — URIC ACID: URIC ACID, SERUM: 3.4 mg/dL (ref 2.5–7.1)

## 2017-09-20 LAB — PROTEIN / CREATININE RATIO, URINE
Creatinine, Urine: 66 mg/dL
PROTEIN CREATININE RATIO: 0.18 mg/mg{creat} — AB (ref 0.00–0.15)
Total Protein, Urine: 12 mg/dL

## 2017-09-20 LAB — LACTATE DEHYDROGENASE: LDH: 112 U/L (ref 98–192)

## 2017-09-20 MED ORDER — ZOLPIDEM TARTRATE 5 MG PO TABS: 5.0000 mg | ORAL_TABLET | Freq: Every evening | ORAL | Status: DC | PRN

## 2017-09-20 MED ORDER — DOCUSATE SODIUM 100 MG PO CAPS
100.0000 mg | ORAL_CAPSULE | Freq: Every day | ORAL | Status: DC
  Administered 2017-09-21 – 2017-09-22 (×2): 100 mg via ORAL
  Filled 2017-09-20 (×4): qty 1

## 2017-09-20 MED ORDER — GUAIFENESIN 100 MG/5ML PO SOLN
5.0000 mL | ORAL | Status: DC | PRN
  Administered 2017-09-20: 100 mg via ORAL
  Administered 2017-09-21: 5 mL via ORAL
  Administered 2017-09-21: 100 mg via ORAL
  Administered 2017-09-21: 5 mL via ORAL
  Filled 2017-09-20 (×3): qty 15
  Filled 2017-09-20: qty 5
  Filled 2017-09-20 (×3): qty 15

## 2017-09-20 MED ORDER — LACTATED RINGERS IV SOLN
INTRAVENOUS | Status: DC
  Administered 2017-09-20 – 2017-09-21 (×2): via INTRAVENOUS

## 2017-09-20 MED ORDER — PRENATAL MULTIVITAMIN CH
1.0000 | ORAL_TABLET | Freq: Every day | ORAL | Status: DC
  Administered 2017-09-20 – 2017-09-21 (×2): 1 via ORAL
  Filled 2017-09-20 (×2): qty 1

## 2017-09-20 MED ORDER — INSULIN NPH (HUMAN) (ISOPHANE) 100 UNIT/ML ~~LOC~~ SUSP
20.0000 [IU] | Freq: Two times a day (BID) | SUBCUTANEOUS | Status: DC
  Administered 2017-09-20: 20 [IU] via SUBCUTANEOUS
  Filled 2017-09-20: qty 10

## 2017-09-20 MED ORDER — CALCIUM CARBONATE ANTACID 500 MG PO CHEW: 2.0000 | CHEWABLE_TABLET | ORAL | Status: DC | PRN

## 2017-09-20 MED ORDER — ACETAMINOPHEN 325 MG PO TABS
650.0000 mg | ORAL_TABLET | ORAL | Status: DC | PRN
  Administered 2017-09-21: 650 mg via ORAL
  Filled 2017-09-20: qty 2

## 2017-09-20 MED ORDER — ASPIRIN 81 MG PO CHEW
81.0000 mg | CHEWABLE_TABLET | Freq: Every day | ORAL | Status: DC
  Administered 2017-09-21 – 2017-09-22 (×2): 81 mg via ORAL
  Filled 2017-09-20 (×4): qty 1

## 2017-09-20 NOTE — Progress Notes (Signed)
Pt off unit to go to ultrasound.

## 2017-09-20 NOTE — Progress Notes (Signed)
24 hour urine initiated at 1345.

## 2017-09-20 NOTE — H&P (Addendum)
Anterpartum History and Physical  Ms Julia Willis is a 27 year old G3P1010 at 27 weeks 4 days presents with severe cough, congestion, subjective chest pain and shortness of breath and  Decreased fetal movement.  She is an insulin dependent diabetic. Her history is significant for neonatal demise at [place] last year.  She denies headache, RUQ pain, no scotomata, no blurry vision.     Pregnancy complicated by Uncontrolled diabetes  PMHX  Past Medical History:  Diagnosis Date  . Acid reflux   . Diabetes mellitus    metformin  . Gonorrhea June 2013  . Ovarian cyst   . Pre-eclampsia   . Thrombocytosis ([place])     PSURG HX  Past Surgical History:  Procedure Laterality Date  . CESAREAN SECTION N/A 02/05/2017   Procedure: CESAREAN SECTION;  Surgeon: Thurnell Lose, MD;  Location: [place];  Service: Obstetrics;  Laterality: N/A;  . NO PAST SURGERIES      FAM HX  Family History  Problem Relation Age of Onset  . Diabetes Mother   . Hypertension Father   . Heart disease Father   . Diabetes Maternal Aunt   . Hypertension Maternal Aunt   . Diabetes Maternal Uncle   . Hypertension Maternal Uncle   . Cancer Maternal Uncle   . Hypertension Paternal Aunt   . Hypertension Paternal Uncle     SOC HX  Social History   Socioeconomic History  . Marital status: Single    Spouse name: Not on file  . Number of children: Not on file  . Years of education: Not on file  . Highest education level: Not on file  Occupational History  . Not on file  Social Needs  . Financial resource strain: Not on file  . Food insecurity:    Worry: Not on file    Inability: Not on file  . Transportation needs:    Medical: Not on file    Non-medical: Not on file  Tobacco Use  . Smoking status: Never Smoker  . Smokeless tobacco: Never Used  Substance and Sexual Activity  . Alcohol use: No  . Drug use: No  . Sexual activity: Yes    Birth control/protection: None  Lifestyle  . Physical  activity:    Days per week: Not on file    Minutes per session: Not on file  . Stress: Not on file  Relationships  . Social connections:    Talks on phone: Not on file    Gets together: Not on file    Attends religious service: Not on file    Active member of club or organization: Not on file    Attends meetings of clubs or organizations: Not on file    Relationship status: Not on file  . Intimate partner violence:    Fear of current or ex partner: Not on file    Emotionally abused: Not on file    Physically abused: Not on file    Forced sexual activity: Not on file  Other Topics Concern  . Not on file  Social History Narrative  . Not on file    ROS  Review of Systems - Negative except for cough congestion General ROS: positive for  - chills Respiratory ROS: positive for - cough, pleuritic pain and shortness of breath  EFM:  Baseline 150's minimal variability no accels or decels.  Several breaks in the strip TOCO: few irregular low amplitude contractions  LABS CBC    Component Value Date/Time  WBC 8.3 09/20/2017 1323   RBC 3.74 (L) 09/20/2017 1323   HGB 8.7 (L) 09/20/2017 1323   HCT 27.6 (L) 09/20/2017 1323   PLT 310 09/20/2017 1323   MCV 73.8 (L) 09/20/2017 1323   MCH 23.3 (L) 09/20/2017 1323   MCHC 31.5 09/20/2017 1323   RDW 16.4 (H) 09/20/2017 1323   LYMPHSABS 1.7 03/13/2014 0620   MONOABS 0.4 03/13/2014 0620   EOSABS 0.1 03/13/2014 0620   BASOSABS 0.0 03/13/2014 0620    Gen AAOx3 NAD Chest: +wheezes, no Rales or rhonci ABD Gravid soft NT GU deferred EXT No C/C/E  Assessment: IUP [redacted]w[redacted]d with uncontrolled Type II diabetes, now with an upper respiratory infection   Plan: Admit to antepartum for glucose management  MFM consulted for co-management Will consult diabetic teaching / education Diabetic diet Will check FBS fasting, qAC, and 3hr.  Robitussin prn cough. Will get chest x ray to ensure there is no e/o pneumonia MFM Ultrasound ordered for growth.   Diastolic Blood pressures elevated so will get preeclampsia labs  Sherrika Weakland, [place]

## 2017-09-20 NOTE — MAU Note (Signed)
PT SAYS STARTED COUGH ON Tuesday - FEELS CP WHEN COUGHING - STARTED TONIGHT  HAS BEEN TO [facility]  ON 9-10- TOOK ROBITUSSIN.   WED AM CALLED OFFICE- TO NURSE LINE - GO TO ER.  HAS RUNNY NOSE AND H/A.

## 2017-09-21 ENCOUNTER — Encounter (HOSPITAL_COMMUNITY): Payer: Self-pay

## 2017-09-21 ENCOUNTER — Observation Stay (HOSPITAL_COMMUNITY): Payer: Medicaid Other

## 2017-09-21 DIAGNOSIS — O368120 Decreased fetal movements, second trimester, not applicable or unspecified: Secondary | ICD-10-CM | POA: Diagnosis present

## 2017-09-21 DIAGNOSIS — Z794 Long term (current) use of insulin: Secondary | ICD-10-CM | POA: Diagnosis not present

## 2017-09-21 DIAGNOSIS — O99012 Anemia complicating pregnancy, second trimester: Secondary | ICD-10-CM | POA: Diagnosis present

## 2017-09-21 DIAGNOSIS — O24112 Pre-existing diabetes mellitus, type 2, in pregnancy, second trimester: Secondary | ICD-10-CM | POA: Diagnosis present

## 2017-09-21 DIAGNOSIS — J069 Acute upper respiratory infection, unspecified: Secondary | ICD-10-CM | POA: Diagnosis present

## 2017-09-21 DIAGNOSIS — O34219 Maternal care for unspecified type scar from previous cesarean delivery: Secondary | ICD-10-CM

## 2017-09-21 DIAGNOSIS — D509 Iron deficiency anemia, unspecified: Secondary | ICD-10-CM | POA: Diagnosis present

## 2017-09-21 DIAGNOSIS — E1165 Type 2 diabetes mellitus with hyperglycemia: Secondary | ICD-10-CM | POA: Diagnosis present

## 2017-09-21 DIAGNOSIS — O99512 Diseases of the respiratory system complicating pregnancy, second trimester: Secondary | ICD-10-CM | POA: Diagnosis present

## 2017-09-21 DIAGNOSIS — Z3A27 27 weeks gestation of pregnancy: Secondary | ICD-10-CM | POA: Diagnosis not present

## 2017-09-21 LAB — GLUCOSE, CAPILLARY
GLUCOSE-CAPILLARY: 164 mg/dL — AB (ref 70–99)
GLUCOSE-CAPILLARY: 147 mg/dL — AB (ref 70–99)
GLUCOSE-CAPILLARY: 158 mg/dL — AB (ref 70–99)
GLUCOSE-CAPILLARY: 97 mg/dL (ref 70–99)
GLUCOSE-CAPILLARY: 157 mg/dL — AB (ref 70–99)
GLUCOSE-CAPILLARY: 96 mg/dL (ref 70–99)
GLUCOSE-CAPILLARY: 130 mg/dL — AB (ref 70–99)
Glucose-Capillary: 144 mg/dL — ABNORMAL HIGH (ref 70–99)

## 2017-09-21 LAB — PROTEIN, URINE, 24 HOUR
Collection Interval-UPROT: 24 hours
Protein, 24H Urine: 226 mg/d — ABNORMAL HIGH (ref 50–100)
Protein, Urine: 8 mg/dL
Urine Total Volume-UPROT: 2825 mL

## 2017-09-21 MED ORDER — INSULIN ASPART 100 UNIT/ML ~~LOC~~ SOLN
0.0000 [IU] | Freq: Three times a day (TID) | SUBCUTANEOUS | Status: DC
  Administered 2017-09-21: 3 [IU] via SUBCUTANEOUS
  Administered 2017-09-21 – 2017-09-22 (×3): 4 [IU] via SUBCUTANEOUS

## 2017-09-21 MED ORDER — INSULIN NPH (HUMAN) (ISOPHANE) 100 UNIT/ML ~~LOC~~ SUSP
24.0000 [IU] | Freq: Two times a day (BID) | SUBCUTANEOUS | Status: DC
  Administered 2017-09-21: 24 [IU] via SUBCUTANEOUS
  Filled 2017-09-21: qty 10

## 2017-09-21 MED ORDER — INSULIN NPH (HUMAN) (ISOPHANE) 100 UNIT/ML ~~LOC~~ SUSP
24.0000 [IU] | Freq: Every day | SUBCUTANEOUS | Status: DC
  Administered 2017-09-22: 24 [IU] via SUBCUTANEOUS

## 2017-09-21 MED ORDER — INSULIN NPH (HUMAN) (ISOPHANE) 100 UNIT/ML ~~LOC~~ SUSP
32.0000 [IU] | Freq: Every day | SUBCUTANEOUS | Status: DC
  Administered 2017-09-21: 32 [IU] via SUBCUTANEOUS

## 2017-09-21 NOTE — Consult Note (Signed)
Maternal-Fetal Medicine  Patient's Name: Julia Willis MRN: [id number] Requesting Provider: Sanjuana Kava, MD  I had the pleasure of seeing Julia Willis at the [place] [facility]. She is a G3 P1010 at 27w 5d with type 2 diabetes. Patient had nasal discharge for about 2 weeks and then cough for 2 days. When she had nonreactive NST at the MAU and her c/o decreased fetal movements, the patient was admitted for further management.  She takes insulin NPH 24 units at night and 24 units in the morning.  Past medical history is significant for diabetes diagnosed 8 years ago. Patient, apparently, had suboptimal control of diabetes. In this pregnancy, insulin NPH was initiated twice daily and her blood glucose levels are still not well controlled. She has not had retinal exam for several years. She reports no symptoms suggestive of neuropathy and her renal function is normal. PSH: Cesarean section. Obstetric history: -05/2012: Spontaneous delivery of twins at 40 weeks' gestation following PPROM. 01/2017: At 39 weeks, she underwent induction of labor because of type 2 diabetes. On admission, gestational hypertension was also diagnosed. Patient had cesarean delivery because of fetal intolerance to labor. After delivery, the baby was transported to  [facility]. The newborn died at 2 days after birth because of complications. Gyn: Previous cycles were reportedly regular. No history of cervical surgeries. Medications: Prenatal vitamins, insulin, aspirin. Allergies: NKDA Social: Denies tobacco or drug or alcohol use. She is a Tax adviser. Her partner is [place] Optometrist and he is in good health. Family: Mother has diabetes. No history of venous thromboembolism in the family.  P/E: Patient is comfortably lying in bed; not in distress. Vitals stable. Abd: Soft gravid uterus; no tenderness. No pedal edema. NST: Reactive for this gestational age.  Labs: Hb 8.7, Hct 27.6, PLT 310, WBC 8.3, MCV 73.8, O  positive. Na 132, AST 12, ALT 9, creatinine 0.39.  On ultrasound performed yesterday, fetal growth was appropriate for gestational age. Polyhydramnios was seen. Placenta is posterior and there is no evidence of previa or accreta.  I counseled the patient on the following: Pregestational diabetes: -Diabetes is not well controlled. I explained timed glucose monitoring (patient reports she checks her blood glucose 4 times daily at home) and the normal parameters. -Dosage of insulin needs to be adjusted. Insulin, diet and exercise help in controlling diabetes. -Poorly-controlled diabetes can lead to adverse fetal and neonatal outcomes including stillbirth, respiratory-distress syndrome and neonatal complications. -Recommend inpatient management for a few days till we find optimal insulin dosage to control diabetes. -Discussed ultrasound protocol of fetal growth assessment every 4 weeks and weekly antenatal testing from 32 weeks till delivery. -Timing of delivery: If diabetes is well controlled, she can be delivered at 39 weeks. If not well controlled, early term delivery (37 to 39 weeks) may be considered.  Previous cesarean section: Patient wants repeat cesarean delivery. I briefly discussed VBAC, its benefits and risks. The likelihood of placenta previa or accreta increases with repeat cesarean deliveries.  Anemia: Most-likely iron-deficiency anemia. I recommend iron supplements. If the patient is not able to tolerate oral iron, intravenous iron transfusion may be considered.  Recommendations: -Inpatient management for blood glucose control. -Blood glucose monitoring fasting and 1-hour postprandial. -Aim to keep fasting under 95 mg/dL and postprandial less than 130 mg/dL. -Increase insulin NPH to 32 units. -Add Humalog with each meal. Start with 6 units with each meal. Patient may require less insulin in hospital setting once ADA diet is given. Daily adjustments may be made till  blood glucose  levels are normal.  Sliding scale insulin for postprandial values. Following may be adopted (guidelines only): For blood glucose values (Humalog insulin): Less than 130 mg/dL: No insulin. 131-180 mg/dL: 2 units. 181-230 mg/dL: 4 units.  231-280 mg/dL: 6 units. 281-330 mg/dL: 8 units. Greater than 330 mg/dL: 10 units and call MD.  Thank you for your consult. Please do not hesitate to contact me if you have any questions or concerns.  Consultation including face-to-face counseling: 40 min.

## 2017-09-21 NOTE — Progress Notes (Signed)
HS CBG 144 patient ate bedtime snack prior to HS CBG. Scheduled 32 units of NPH given

## 2017-09-21 NOTE — Progress Notes (Signed)
Inpatient Diabetes Program Recommendations  AACE/ADA: New Consensus Statement on Inpatient Glycemic Control (2015)  Target Ranges:  Prepandial:   less than 140 mg/dL      Peak postprandial:   less than 180 mg/dL (1-2 hours)      Critically ill patients:  140 - 180 mg/dL   Lab Results  Component Value Date   GLUCAP 97 09/21/2017   HGBA1C 12.3 09/03/2014    Review of Glycemic Control Results for Julia Willis, Julia Willis (MRN [id number]) as of 09/21/2017 14:36  Ref. Range 09/21/2017 09:29 09/21/2017 11:35 09/21/2017 13:28  Glucose-Capillary Latest Ref Range: 70 - 99 mg/dL 147 (H) 158 (H) 97   Diabetes history: Type 2 DM Outpatient Diabetes medications: NPH 20 units BID Current orders for Inpatient glycemic control: Novolog 0-24 units TID, NPH 32 units QHS, NPH 24 units QAM  Inpatient Diabetes Program Recommendations:    Spoke with patient regarding outpatient management of DM (via [place]). Patient has had diabetes for 8 years and per the patient, "I have had enough education and know what I am supposed to do, I just have to do it."  Patient was last seen by PCP back in 2016, A1C was 12.3%. Explained what a A1c is and what it measures. Also reviewed goal A1c with patient, importance of good glucose control @ home, and blood sugar goals. Discussed need for insulin, role of pancreas, vascular changes that occur from poor control, impact of hormonal changes in pregnancy to blood sugars, hyperinsulinemia in the neonate and commorbidities assocated.  Patient has a meter and testing supplies. Encouraged to check blood sugars 2-3 times per day and to follow up with OB and outpatient endocrinologist. Educated patient on role of endocrinologist and that it may be to her advantage to have them follow during pregnancy to prepare appropatietely following pregnancy.  Patient also will needs PCP postpartum. Will place consult for case management.  Patient has no furhter questions at this time related to  DM.  Noted Dr Jaquita Rector note and recommendations and updated orders per Dr Cletis Media compared to this AM. Will watch trends given insulin adjustments.   Thanks, Bronson Curb, MSN, RNC-OB Diabetes Coordinator [phone] (8a-5p)

## 2017-09-21 NOTE — Progress Notes (Signed)
Hospital day # 1 pregnancy at [redacted]w[redacted]d here for management of poorly controlled Insulin requiring type 2 diabetes  S: well, reports good fetal activity      Contractions:none      Vaginal bleeding:none now       Vaginal discharge: no significant change  Has been seen by nutritionist and spoke with Diabetes Coordinator URI symptoms also improving.  O: BP 111/81 (BP Location: Right Arm)   Pulse (!) 106   Temp 97.9 F (36.6 C) (Oral)   Resp 18   Ht 5\' 6"  (1.676 m)   Wt 102.1 kg   LMP 03/11/2017   SpO2 99%   BMI 36.32 kg/m        Uterus gravid and non-tender      Extremities: no significant edema and no signs of DVT  A: [redacted]w[redacted]d with poorly controlled Insulin requiring type 2 diabetes   P: Seen by Dr Donalee Citrin MFM. Recommendations reviewed.       NPH 24 units am  32 units HS      Tight Novolog sliding scale on [facility] testing      Plan discharge tomorrow pm or Sunday am with continued outpatient management        Dede Query Ishita Mcnerney  MD 09/21/2017 4:03 PM

## 2017-09-21 NOTE — Progress Notes (Signed)
Initial Nutrition Assessment  DOCUMENTATION CODES:   Obesity unspecified  INTERVENTION:  Carbohydrate modified gestational diet/ double protein portions Encouraged to order her own snacks so that the snacks would be food she liked Opportunity for diet educ provided - basic concepts reviewed  NUTRITION DIAGNOSIS:   Limited adherence to nutrition-related recommendations related to social / environmental circumstances as evidenced by other (comment)(hyperglycemia).  GOAL:  Patient will meet greater than or equal to 90% of their needs MONITOR:  Labs  REASON FOR ASSESSMENT:  Gestational Diabetes   ASSESSMENT:   27 2/7 weeks, GDM, Hx of DM II.  weight at 11 weeks 216 lbs, BMI 35. 8 lb weight gain since.  Currently has good appetite. Admits to non adherence to ADA diet at home. Has had previous diet educ  Diet Order:   Diet Order            Diet gestational carb mod Room service appropriate? Yes; Fluid consistency: Thin  Diet effective now             EDUCATION NEEDS:   Education needs have been addressed   Nutrition Dx: Food and nutrition-related knowledge deficit r/t limited comprehension of previous education aeb pt report.  "Meal  plan for gestational diabetics" handout given to patient.  Basic concepts reviewed.  Pt listened casually, asked basic questions. Further outpt education is recommended  Skin:  Skin Assessment: Reviewed RN Assessment  Height:   Ht Readings from Last 1 Encounters:  09/20/17 5\' 6"  (1.676 m)    Weight:   Wt Readings from Last 1 Encounters:  09/20/17 102.1 kg    Ideal Body Weight:     BMI:  Body mass index is 36.32 kg/m.  Estimated Nutritional Needs:   Kcal:  2200-2400  Protein:  98-108 g  Fluid:  2.5 L    Weyman Rodney M.Fredderick Severance LDN Neonatal Nutrition Support Specialist/RD III Pager [phone]      Phone [phone]

## 2017-09-22 LAB — GLUCOSE, CAPILLARY
GLUCOSE-CAPILLARY: 123 mg/dL — AB (ref 70–99)
Glucose-Capillary: 123 mg/dL — ABNORMAL HIGH (ref 70–99)
Glucose-Capillary: 148 mg/dL — ABNORMAL HIGH (ref 70–99)
Glucose-Capillary: 127 mg/dL — ABNORMAL HIGH (ref 70–99)

## 2017-09-22 MED ORDER — INSULIN NPH (HUMAN) (ISOPHANE) 100 UNIT/ML ~~LOC~~ SUSP: 32.0000 [IU] | Freq: Every day | SUBCUTANEOUS | 11 refills | Status: DC

## 2017-09-22 MED ORDER — INSULIN ASPART 100 UNIT/ML ~~LOC~~ SOLN: 4.0000 [IU] | Freq: Three times a day (TID) | SUBCUTANEOUS | 11 refills | Status: DC

## 2017-09-22 MED ORDER — INSULIN NPH (HUMAN) (ISOPHANE) 100 UNIT/ML ~~LOC~~ SUSP: 24.0000 [IU] | Freq: Every day | SUBCUTANEOUS | 11 refills | Status: DC

## 2017-09-22 NOTE — Discharge Summary (Signed)
Physician Discharge Summary  Patient ID: Julia Willis MRN: [id number] DOB/AGE: Jul 11, 1990 27 y.o.  Admit date: 09/20/2017 Discharge date: 09/22/2017  Admission Diagnoses:  1)27 wks and 4 days with type 2 diabetes 2) viral Upper respiratory infection   Discharge Diagnoses: Same  Active Problems:   Gestational diabetes requiring insulin   Discharged Condition: stable  Hospital Course: Pt was admitted for management of uncontrolled type 2 diabetes. She was seen by the diabetic educator. CBG improved with 24 units of NPH with breakfast and 32 units of NPH at night. She required 3-4 unties of novolog with meals and is discharge on 4 units of novolog with meals in addition to the NPH as above   Consults: diabetic educator   Significant Diagnostic Studies: labs:  Results for orders placed or performed during the hospital encounter of 09/20/17 (from the past 24 hour(s))  Glucose, capillary     Status: None   Collection Time: 09/21/17  1:28 PM  Result Value Ref Range   Glucose-Capillary 97 70 - 99 mg/dL  Glucose, capillary     Status: Abnormal   Collection Time: 09/21/17  3:30 PM  Result Value Ref Range   Glucose-Capillary 157 (H) 70 - 99 mg/dL  Glucose, capillary     Status: None   Collection Time: 09/21/17  6:11 PM  Result Value Ref Range   Glucose-Capillary 96 70 - 99 mg/dL  Glucose, capillary     Status: Abnormal   Collection Time: 09/21/17  8:48 PM  Result Value Ref Range   Glucose-Capillary 130 (H) 70 - 99 mg/dL  Glucose, capillary     Status: Abnormal   Collection Time: 09/21/17 10:58 PM  Result Value Ref Range   Glucose-Capillary 144 (H) 70 - 99 mg/dL   Comment 1 Notify RN   Glucose, capillary     Status: Abnormal   Collection Time: 09/22/17  6:32 AM  Result Value Ref Range   Glucose-Capillary 123 (H) 70 - 99 mg/dL  Glucose, capillary     Status: Abnormal   Collection Time: 09/22/17  8:29 AM  Result Value Ref Range   Glucose-Capillary 123 (H) 70 - 99 mg/dL  Glucose,  capillary     Status: Abnormal   Collection Time: 09/22/17 10:27 AM  Result Value Ref Range   Glucose-Capillary 148 (H) 70 - 99 mg/dL    Treatments: insulin: regular and NPH  Discharge Exam: Blood pressure (!) 119/92, pulse 92, temperature 97.9 F (36.6 C), temperature source Oral, resp. rate 18, height 5\' 6"  (1.676 m), weight 102.1 kg, last menstrual period 03/11/2017, SpO2 100 %, unknown if currently breastfeeding. General appearance: alert, cooperative and no distress  'abdomen gravid nontender  Extremities no edema   Disposition: Discharge disposition: 01-Home or Self Care       Discharge Instructions    Ambulatory referral to Nutrition and Diabetic Education   Complete by:  As directed    Discharge activity:  No Restrictions   Complete by:  As directed    Discharge diet:   Complete by:  As directed    Carb modified diet ( Diabetic diet)   Fetal Kick Count:  Lie on our left side for one hour after a meal, and count the number of times your baby kicks.  If it is less than 5 times, get up, move around and drink some juice.  Repeat the test 30 minutes later.  If it is still less than 5 kicks in an hour, notify your doctor.   Complete by:  As directed    Notify physician for a general feeling that "something is not right"   Complete by:  As directed    Notify physician for increase or change in vaginal discharge   Complete by:  As directed    Notify physician for intestinal cramps, with or without diarrhea, sometimes described as "gas pain"   Complete by:  As directed    Notify physician for leaking of fluid   Complete by:  As directed    Notify physician for low, dull backache, unrelieved by heat or Tylenol   Complete by:  As directed    Notify physician for menstrual like cramps   Complete by:  As directed    Notify physician for pelvic pressure   Complete by:  As directed    Notify physician for uterine contractions.  These may be painless and feel like the uterus is  tightening or the baby is  "balling up"   Complete by:  As directed    Notify physician for vaginal bleeding   Complete by:  As directed    PRETERM LABOR:  Includes any of the follwing symptoms that occur between 20 - [redacted] weeks gestation.  If these symptoms are not stopped, preterm labor can result in preterm delivery, placing your baby at risk   Complete by:  As directed      Allergies as of 09/22/2017   No Known Allergies     Medication List    TAKE these medications   aspirin 81 MG chewable tablet Chew 81 mg by mouth daily.   glucose blood test strip Use as instructed   guaiFENesin 100 MG/5ML liquid Commonly known as:  ROBITUSSIN Take 400 mg by mouth 3 (three) times daily as needed for cough.   insulin aspart 100 UNIT/ML injection Commonly known as:  novoLOG Inject 4 Units into the skin 3 (three) times daily with meals.   insulin NPH Human 100 UNIT/ML injection Commonly known as:  HUMULIN N,NOVOLIN N Inject 0.32 mLs (32 Units total) into the skin at bedtime. What changed:  You were already taking a medication with the same name, and this prescription was added. Make sure you understand how and when to take each.   insulin NPH Human 100 UNIT/ML injection Commonly known as:  HUMULIN N,NOVOLIN N Inject 0.24 mLs (24 Units total) into the skin daily before breakfast. Start taking on:  09/23/2017 What changed:    how much to take  when to take this   PRENATAL VITAMIN PO Take 1 tablet by mouth daily.      Follow-up Information    Sanjuana Kava, MD. Go on 09/26/2017.   Specialty:  Obstetrics and Gynecology Contact information: [address] [place] [address] [phone]           Signed: Catha Brow. 09/22/2017, 12:06 PM

## 2017-09-22 NOTE — Progress Notes (Signed)
Discharge instructions and prescriptions given to pt. Discussed diabetic control, signs and symptoms to report, upcoming appointments, and meds. Pt verbalizes understanding and has no questions or concerns at this time. IV taken out and pt tolerated well. Pt discharged from hospital in stable condition.

## 2017-09-22 NOTE — Progress Notes (Signed)
Hospital day # 2 pregnancy at [redacted]w[redacted]d here for management of poorly controlled Insulin requiring type 2 diabetes  S: well, reports good fetal activity      Contractions:none      Vaginal bleeding:none now                 Vaginal discharge: no significant change  patient is without complaints   Has been seen by nutritionist and spoke with Diabetes Coordinator .  Vitals:   09/22/17 0447 09/22/17 0806  BP: 120/87 (!) 119/92  Pulse: 95 92  Resp: 18 18  Temp: 97.9 F (36.6 C) 97.9 F (36.6 C)  SpO2: 99% 100%         Uterus gravid and non-tender      Extremities: no significant edema and no signs of DVT  NST: FHR baseline 140  With moderate variability  1 spontaneous decel to 90's for 1.5 minutes resolved with position change. Overall reassuring for 27 wks and 6 days.   A: [redacted]w[redacted]d with poorly controlled Insulin requiring type 2 diabetes   P: blood sugars impoved with       NPH 24 units am  32 units HS      plan to discharge home with NOolog 4 units with meals  Fetal Well Being. IF fhr remaines reassuring for an additional Hour. D/c home with close follow up in the office.

## 2017-10-10 ENCOUNTER — Encounter (HOSPITAL_COMMUNITY): Payer: Self-pay

## 2017-10-11 ENCOUNTER — Ambulatory Visit (HOSPITAL_COMMUNITY): Admission: RE | Admit: 2017-10-11 | Payer: Medicaid Other | Source: Ambulatory Visit

## 2017-10-15 ENCOUNTER — Encounter (INDEPENDENT_AMBULATORY_CARE_PROVIDER_SITE_OTHER): Payer: Medicaid Other | Admitting: Ophthalmology

## 2017-11-05 ENCOUNTER — Encounter (HOSPITAL_COMMUNITY): Payer: Self-pay

## 2017-11-08 ENCOUNTER — Ambulatory Visit (HOSPITAL_COMMUNITY): Admission: RE | Admit: 2017-11-08 | Payer: Medicaid Other | Source: Ambulatory Visit

## 2017-11-21 ENCOUNTER — Encounter (HOSPITAL_COMMUNITY): Payer: Self-pay

## 2017-11-22 ENCOUNTER — Telehealth (HOSPITAL_COMMUNITY): Payer: Self-pay | Admitting: *Deleted

## 2017-11-22 NOTE — Telephone Encounter (Signed)
Preadmission screen  

## 2017-11-23 ENCOUNTER — Telehealth (HOSPITAL_COMMUNITY): Payer: Self-pay | Admitting: *Deleted

## 2017-11-23 DIAGNOSIS — D649 Anemia, unspecified: Secondary | ICD-10-CM | POA: Insufficient documentation

## 2017-11-23 NOTE — Telephone Encounter (Signed)
Preadmission screen  

## 2017-11-26 ENCOUNTER — Telehealth (HOSPITAL_COMMUNITY): Payer: Self-pay | Admitting: *Deleted

## 2017-11-26 NOTE — Telephone Encounter (Signed)
Preadmission screen  

## 2017-11-27 ENCOUNTER — Encounter (HOSPITAL_COMMUNITY): Payer: Self-pay

## 2017-11-30 ENCOUNTER — Encounter (HOSPITAL_COMMUNITY)
Admission: RE | Admit: 2017-11-30 | Discharge: 2017-11-30 | Disposition: A | Discharge status: Home / Self Care | Payer: Medicaid Other | Source: Ambulatory Visit | Attending: Obstetrics and Gynecology | Admitting: Obstetrics and Gynecology

## 2017-11-30 HISTORY — DX: Supervision of pregnancy with other poor reproductive or obstetric history, unspecified trimester: O09.299

## 2017-11-30 NOTE — Pre-Procedure Instructions (Signed)
No show for preop.  Office notified.  Voicemail left for pt.  Instructions emailed to pt and sent to office.

## 2017-11-30 NOTE — Patient Instructions (Signed)
Julia Willis  11/30/2017   Your procedure is scheduled on:  12/03/2017  Enter through the Main Entrance of [facility] [address] at [place] up the phone at the desk and dial [phone]  Call this number if you have problems the morning of surgery:[phone]  Remember:   Do not eat food:(After Midnight) Desps de medianoche.  Do not drink clear liquids: (After Midnight) Desps de medianoche.  Take these medicines the morning of surgery with A SIP OF WATER: 16 UNITS NPH INSULIN AT BEDTIME NIGHT BEFORE SURGERY   Do not wear jewelry, make-up or nail polish.  Do not wear lotions, powders, or perfumes. Do not wear deodorant.  Do not shave 48 hours prior to surgery.  Do not bring valuables to the hospital.  [facility] is not   responsible for any belongings or valuables brought to the hospital.  Contacts, dentures or bridgework may not be worn into surgery.  Leave suitcase in the car. After surgery it may be brought to your room.  For patients admitted to the hospital, checkout time is 11:00 AM the day of              discharge.    N/A   Please read over the following fact sheets that you were given:   Surgical Site Infection Prevention

## 2017-12-02 ENCOUNTER — Inpatient Hospital Stay (HOSPITAL_COMMUNITY): Payer: Medicaid Other | Admitting: Anesthesiology

## 2017-12-02 ENCOUNTER — Inpatient Hospital Stay (HOSPITAL_BASED_OUTPATIENT_CLINIC_OR_DEPARTMENT_OTHER): Payer: Medicaid Other

## 2017-12-02 ENCOUNTER — Encounter (HOSPITAL_COMMUNITY)
Admission: RE | Disposition: A | Discharge status: Home / Self Care | Payer: Self-pay | Source: Home / Self Care | Attending: Obstetrics and Gynecology

## 2017-12-02 ENCOUNTER — Encounter (HOSPITAL_COMMUNITY): Payer: Self-pay | Admitting: Anesthesiology

## 2017-12-02 ENCOUNTER — Inpatient Hospital Stay (HOSPITAL_COMMUNITY)
Admission: RE | Admit: 2017-12-02 | Discharge: 2017-12-05 | DRG: 786 | Disposition: A | Discharge status: Home / Self Care | Payer: Medicaid Other | Attending: Obstetrics and Gynecology | Admitting: Obstetrics and Gynecology

## 2017-12-02 DIAGNOSIS — O2412 Pre-existing diabetes mellitus, type 2, in childbirth: Secondary | ICD-10-CM | POA: Diagnosis present

## 2017-12-02 DIAGNOSIS — Z3493 Encounter for supervision of normal pregnancy, unspecified, third trimester: Secondary | ICD-10-CM

## 2017-12-02 DIAGNOSIS — Z794 Long term (current) use of insulin: Secondary | ICD-10-CM

## 2017-12-02 DIAGNOSIS — O9902 Anemia complicating childbirth: Secondary | ICD-10-CM | POA: Diagnosis present

## 2017-12-02 DIAGNOSIS — O289 Unspecified abnormal findings on antenatal screening of mother: Secondary | ICD-10-CM | POA: Diagnosis not present

## 2017-12-02 DIAGNOSIS — Z3A38 38 weeks gestation of pregnancy: Secondary | ICD-10-CM

## 2017-12-02 DIAGNOSIS — O99214 Obesity complicating childbirth: Secondary | ICD-10-CM | POA: Diagnosis present

## 2017-12-02 DIAGNOSIS — E119 Type 2 diabetes mellitus without complications: Secondary | ICD-10-CM | POA: Diagnosis present

## 2017-12-02 DIAGNOSIS — O34211 Maternal care for low transverse scar from previous cesarean delivery: Principal | ICD-10-CM | POA: Diagnosis present

## 2017-12-02 DIAGNOSIS — O99824 Streptococcus B carrier state complicating childbirth: Secondary | ICD-10-CM | POA: Diagnosis present

## 2017-12-02 DIAGNOSIS — D649 Anemia, unspecified: Secondary | ICD-10-CM | POA: Diagnosis present

## 2017-12-02 DIAGNOSIS — E669 Obesity, unspecified: Secondary | ICD-10-CM | POA: Diagnosis present

## 2017-12-02 LAB — CBC
HCT: 33.9 % — ABNORMAL LOW (ref 36.0–46.0)
Hemoglobin: 10.1 g/dL — ABNORMAL LOW (ref 12.0–15.0)
MCH: 22.1 pg — ABNORMAL LOW (ref 26.0–34.0)
MCHC: 29.8 g/dL — ABNORMAL LOW (ref 30.0–36.0)
MCV: 74.2 fL — ABNORMAL LOW (ref 80.0–100.0)
PLATELETS: 437 10*3/uL — AB (ref 150–400)
RBC: 4.57 MIL/uL (ref 3.87–5.11)
RDW: 18 % — ABNORMAL HIGH (ref 11.5–15.5)
WBC: 5.6 10*3/uL (ref 4.0–10.5)
nRBC: 0 % (ref 0.0–0.2)

## 2017-12-02 LAB — GLUCOSE, CAPILLARY
GLUCOSE-CAPILLARY: 113 mg/dL — AB (ref 70–99)
GLUCOSE-CAPILLARY: 94 mg/dL (ref 70–99)
Glucose-Capillary: 111 mg/dL — ABNORMAL HIGH (ref 70–99)
Glucose-Capillary: 105 mg/dL — ABNORMAL HIGH (ref 70–99)

## 2017-12-02 LAB — TYPE AND SCREEN
ABO/RH(D): O POS
ANTIBODY SCREEN: NEGATIVE

## 2017-12-02 LAB — POCT FERN TEST: POCT Fern Test: NEGATIVE

## 2017-12-02 LAB — AMNISURE RUPTURE OF MEMBRANE (ROM) NOT AT ARMC: Amnisure ROM: NEGATIVE

## 2017-12-02 LAB — RPR: RPR: NONREACTIVE

## 2017-12-02 SURGERY — Surgical Case
Anesthesia: Spinal

## 2017-12-02 MED ORDER — DIBUCAINE 1 % RE OINT: 1.0000 "application " | TOPICAL_OINTMENT | RECTAL | Status: DC | PRN

## 2017-12-02 MED ORDER — OXYCODONE HCL 5 MG PO TABS: 5.0000 mg | ORAL_TABLET | Freq: Once | ORAL | Status: DC | PRN

## 2017-12-02 MED ORDER — MORPHINE SULFATE (PF) 0.5 MG/ML IJ SOLN
INTRAMUSCULAR | Status: DC | PRN
  Administered 2017-12-02: .15 mg via INTRATHECAL

## 2017-12-02 MED ORDER — INSULIN ASPART 100 UNIT/ML ~~LOC~~ SOLN: 0.0000 [IU] | Freq: Three times a day (TID) | SUBCUTANEOUS | Status: DC

## 2017-12-02 MED ORDER — CEFAZOLIN SODIUM-DEXTROSE 2-4 GM/100ML-% IV SOLN
2.0000 g | Freq: Once | INTRAVENOUS | Status: AC
  Administered 2017-12-02: 2 g via INTRAVENOUS
  Filled 2017-12-02: qty 100

## 2017-12-02 MED ORDER — LACTATED RINGERS IV BOLUS
1000.0000 mL | Freq: Once | INTRAVENOUS | Status: AC
  Administered 2017-12-02: 1000 mL via INTRAVENOUS

## 2017-12-02 MED ORDER — OXYTOCIN 40 UNITS IN LACTATED RINGERS INFUSION - SIMPLE MED: 2.5000 [IU]/h | INTRAVENOUS | Status: AC

## 2017-12-02 MED ORDER — FENTANYL CITRATE (PF) 100 MCG/2ML IJ SOLN
25.0000 ug | INTRAMUSCULAR | Status: DC | PRN
  Administered 2017-12-02: 25 ug via INTRAVENOUS

## 2017-12-02 MED ORDER — PHENYLEPHRINE 8 MG IN D5W 100 ML (0.08MG/ML) PREMIX OPTIME
INJECTION | INTRAVENOUS | Status: DC | PRN
  Administered 2017-12-02: 60 ug/min via INTRAVENOUS

## 2017-12-02 MED ORDER — MENTHOL 3 MG MT LOZG: 1.0000 | LOZENGE | OROMUCOSAL | Status: DC | PRN

## 2017-12-02 MED ORDER — KETOROLAC TROMETHAMINE 30 MG/ML IJ SOLN
INTRAMUSCULAR | Status: AC
  Filled 2017-12-02: qty 1

## 2017-12-02 MED ORDER — DIPHENHYDRAMINE HCL 25 MG PO CAPS
25.0000 mg | ORAL_CAPSULE | Freq: Four times a day (QID) | ORAL | Status: DC | PRN
  Administered 2017-12-02 – 2017-12-03 (×3): 25 mg via ORAL
  Filled 2017-12-02 (×3): qty 1

## 2017-12-02 MED ORDER — ZOLPIDEM TARTRATE 5 MG PO TABS: 5.0000 mg | ORAL_TABLET | Freq: Every evening | ORAL | Status: DC | PRN

## 2017-12-02 MED ORDER — PRENATAL MULTIVITAMIN CH
1.0000 | ORAL_TABLET | Freq: Every day | ORAL | Status: DC
  Administered 2017-12-03 – 2017-12-05 (×3): 1 via ORAL
  Filled 2017-12-02 (×3): qty 1

## 2017-12-02 MED ORDER — SENNOSIDES-DOCUSATE SODIUM 8.6-50 MG PO TABS
2.0000 | ORAL_TABLET | ORAL | Status: DC
  Administered 2017-12-02 – 2017-12-04 (×3): 2 via ORAL
  Filled 2017-12-02 (×3): qty 2

## 2017-12-02 MED ORDER — ERYTHROMYCIN 5 MG/GM OP OINT
TOPICAL_OINTMENT | OPHTHALMIC | Status: AC
  Filled 2017-12-02: qty 1

## 2017-12-02 MED ORDER — FENTANYL CITRATE (PF) 100 MCG/2ML IJ SOLN
INTRAMUSCULAR | Status: AC
  Filled 2017-12-02: qty 2

## 2017-12-02 MED ORDER — INSULIN NPH (HUMAN) (ISOPHANE) 100 UNIT/ML ~~LOC~~ SUSP
24.0000 [IU] | Freq: Every day | SUBCUTANEOUS | Status: DC
  Filled 2017-12-02: qty 10

## 2017-12-02 MED ORDER — BUPIVACAINE HCL (PF) 0.5 % IJ SOLN
INTRAMUSCULAR | Status: DC | PRN
  Administered 2017-12-02: 10 mL

## 2017-12-02 MED ORDER — ENOXAPARIN SODIUM 40 MG/0.4ML ~~LOC~~ SOLN
40.0000 mg | SUBCUTANEOUS | Status: DC
  Administered 2017-12-03 – 2017-12-04 (×2): 40 mg via SUBCUTANEOUS
  Filled 2017-12-02 (×2): qty 0.4

## 2017-12-02 MED ORDER — WITCH HAZEL-GLYCERIN EX PADS: 1.0000 "application " | MEDICATED_PAD | CUTANEOUS | Status: DC | PRN

## 2017-12-02 MED ORDER — OXYCODONE HCL 5 MG/5ML PO SOLN: 5.0000 mg | Freq: Once | ORAL | Status: DC | PRN

## 2017-12-02 MED ORDER — BUPIVACAINE IN DEXTROSE 0.75-8.25 % IT SOLN
INTRATHECAL | Status: DC | PRN
  Administered 2017-12-02: 1.6 mL via INTRATHECAL

## 2017-12-02 MED ORDER — COCONUT OIL OIL: 1.0000 "application " | TOPICAL_OIL | Status: DC | PRN

## 2017-12-02 MED ORDER — ONDANSETRON HCL 4 MG/2ML IJ SOLN
INTRAMUSCULAR | Status: DC | PRN
  Administered 2017-12-02: 4 mg via INTRAVENOUS

## 2017-12-02 MED ORDER — INSULIN NPH (HUMAN) (ISOPHANE) 100 UNIT/ML ~~LOC~~ SUSP
16.0000 [IU] | Freq: Every evening | SUBCUTANEOUS | Status: DC
  Administered 2017-12-02 – 2017-12-03 (×2): 16 [IU] via SUBCUTANEOUS

## 2017-12-02 MED ORDER — FAMOTIDINE IN NACL 20-0.9 MG/50ML-% IV SOLN
20.0000 mg | Freq: Once | INTRAVENOUS | Status: AC
  Administered 2017-12-02: 20 mg via INTRAVENOUS

## 2017-12-02 MED ORDER — TETANUS-DIPHTH-ACELL PERTUSSIS 5-2.5-18.5 LF-MCG/0.5 IM SUSP: 0.5000 mL | Freq: Once | INTRAMUSCULAR | Status: DC

## 2017-12-02 MED ORDER — BUPIVACAINE HCL (PF) 0.5 % IJ SOLN
INTRAMUSCULAR | Status: AC
  Filled 2017-12-02: qty 30

## 2017-12-02 MED ORDER — KETOROLAC TROMETHAMINE 30 MG/ML IJ SOLN
30.0000 mg | Freq: Four times a day (QID) | INTRAMUSCULAR | Status: AC | PRN
  Administered 2017-12-02: 30 mg via INTRAMUSCULAR

## 2017-12-02 MED ORDER — LACTATED RINGERS IV SOLN
INTRAVENOUS | Status: DC | PRN
  Administered 2017-12-02: 09:00:00 via INTRAVENOUS

## 2017-12-02 MED ORDER — TRIAMCINOLONE ACETONIDE 40 MG/ML IJ SUSP
INTRAMUSCULAR | Status: DC | PRN
  Administered 2017-12-02: 40 mg via INTRAMUSCULAR

## 2017-12-02 MED ORDER — OXYCODONE HCL 5 MG PO TABS: 10.0000 mg | ORAL_TABLET | ORAL | Status: DC | PRN

## 2017-12-02 MED ORDER — FAMOTIDINE IN NACL 20-0.9 MG/50ML-% IV SOLN
INTRAVENOUS | Status: AC
  Administered 2017-12-02: 20 mg via INTRAVENOUS
  Filled 2017-12-02: qty 50

## 2017-12-02 MED ORDER — SIMETHICONE 80 MG PO CHEW
80.0000 mg | CHEWABLE_TABLET | Freq: Three times a day (TID) | ORAL | Status: DC
  Administered 2017-12-02 – 2017-12-05 (×7): 80 mg via ORAL
  Filled 2017-12-02 (×7): qty 1

## 2017-12-02 MED ORDER — INSULIN NPH (HUMAN) (ISOPHANE) 100 UNIT/ML ~~LOC~~ SUSP
12.0000 [IU] | Freq: Every day | SUBCUTANEOUS | Status: DC
  Administered 2017-12-03 – 2017-12-04 (×2): 12 [IU] via SUBCUTANEOUS

## 2017-12-02 MED ORDER — SOD CITRATE-CITRIC ACID 500-334 MG/5ML PO SOLN
ORAL | Status: AC
  Administered 2017-12-02: 30 mL via ORAL
  Filled 2017-12-02: qty 15

## 2017-12-02 MED ORDER — PHENYLEPHRINE 8 MG IN D5W 100 ML (0.08MG/ML) PREMIX OPTIME
INJECTION | INTRAVENOUS | Status: AC
  Filled 2017-12-02: qty 100

## 2017-12-02 MED ORDER — LACTATED RINGERS IV SOLN
INTRAVENOUS | Status: DC | PRN
  Administered 2017-12-02 (×2): via INTRAVENOUS

## 2017-12-02 MED ORDER — SIMETHICONE 80 MG PO CHEW: 80.0000 mg | CHEWABLE_TABLET | ORAL | Status: DC | PRN

## 2017-12-02 MED ORDER — KETOROLAC TROMETHAMINE 30 MG/ML IJ SOLN: 30.0000 mg | Freq: Four times a day (QID) | INTRAMUSCULAR | Status: AC | PRN

## 2017-12-02 MED ORDER — OXYCODONE HCL 5 MG PO TABS
5.0000 mg | ORAL_TABLET | ORAL | Status: DC | PRN
  Administered 2017-12-02 – 2017-12-04 (×5): 5 mg via ORAL
  Filled 2017-12-02 (×5): qty 1

## 2017-12-02 MED ORDER — TRIAMCINOLONE ACETONIDE 40 MG/ML IJ SUSP
40.0000 mg | Freq: Once | INTRAMUSCULAR | Status: DC
  Filled 2017-12-02: qty 1

## 2017-12-02 MED ORDER — MORPHINE SULFATE (PF) 0.5 MG/ML IJ SOLN
INTRAMUSCULAR | Status: AC
  Filled 2017-12-02: qty 10

## 2017-12-02 MED ORDER — IBUPROFEN 600 MG PO TABS
600.0000 mg | ORAL_TABLET | Freq: Four times a day (QID) | ORAL | Status: DC
  Administered 2017-12-02 – 2017-12-05 (×13): 600 mg via ORAL
  Filled 2017-12-02 (×13): qty 1

## 2017-12-02 MED ORDER — FENTANYL CITRATE (PF) 100 MCG/2ML IJ SOLN
INTRAMUSCULAR | Status: DC | PRN
  Administered 2017-12-02: 15 ug via INTRATHECAL

## 2017-12-02 MED ORDER — MEPERIDINE HCL 25 MG/ML IJ SOLN: 6.2500 mg | INTRAMUSCULAR | Status: DC | PRN

## 2017-12-02 MED ORDER — SOD CITRATE-CITRIC ACID 500-334 MG/5ML PO SOLN
30.0000 mL | Freq: Once | ORAL | Status: AC
  Administered 2017-12-02: 30 mL via ORAL

## 2017-12-02 MED ORDER — LACTATED RINGERS IV SOLN
INTRAVENOUS | Status: DC
  Administered 2017-12-02: 999 mL via INTRAVENOUS

## 2017-12-02 MED ORDER — PROMETHAZINE HCL 25 MG/ML IJ SOLN: 6.2500 mg | INTRAMUSCULAR | Status: DC | PRN

## 2017-12-02 MED ORDER — LACTATED RINGERS IV SOLN
INTRAVENOUS | Status: DC
  Administered 2017-12-02: 07:00:00 via INTRAVENOUS

## 2017-12-02 MED ORDER — INSULIN NPH (HUMAN) (ISOPHANE) 100 UNIT/ML ~~LOC~~ SUSP
32.0000 [IU] | Freq: Every evening | SUBCUTANEOUS | Status: DC
  Filled 2017-12-02: qty 10

## 2017-12-02 MED ORDER — ACETAMINOPHEN 325 MG PO TABS
650.0000 mg | ORAL_TABLET | ORAL | Status: DC | PRN
  Administered 2017-12-03 – 2017-12-04 (×3): 650 mg via ORAL
  Filled 2017-12-02 (×3): qty 2

## 2017-12-02 MED ORDER — SIMETHICONE 80 MG PO CHEW
80.0000 mg | CHEWABLE_TABLET | ORAL | Status: DC
  Administered 2017-12-02 – 2017-12-04 (×3): 80 mg via ORAL
  Filled 2017-12-02 (×3): qty 1

## 2017-12-02 MED ORDER — SODIUM CHLORIDE (PF) 0.9 % IJ SOLN
INTRAMUSCULAR | Status: AC
  Filled 2017-12-02: qty 20

## 2017-12-02 MED ORDER — SODIUM CHLORIDE 0.9 % IR SOLN
Status: DC | PRN
  Administered 2017-12-02: 1

## 2017-12-02 MED ORDER — ONDANSETRON HCL 4 MG/2ML IJ SOLN
INTRAMUSCULAR | Status: AC
  Filled 2017-12-02: qty 2

## 2017-12-02 MED ORDER — OXYTOCIN 10 UNIT/ML IJ SOLN
INTRAMUSCULAR | Status: AC
  Filled 2017-12-02: qty 4

## 2017-12-02 MED ORDER — OXYTOCIN 10 UNIT/ML IJ SOLN
INTRAVENOUS | Status: DC | PRN
  Administered 2017-12-02: 40 [IU] via INTRAVENOUS

## 2017-12-02 MED ORDER — VITAMIN K1 1 MG/0.5ML IJ SOLN
INTRAMUSCULAR | Status: AC
  Filled 2017-12-02: qty 0.5

## 2017-12-02 SURGICAL SUPPLY — 43 items
BARRIER ADHS 3X4 INTERCEED (GAUZE/BANDAGES/DRESSINGS) ×2 IMPLANT
BENZOIN TINCTURE PRP APPL 2/3 (GAUZE/BANDAGES/DRESSINGS) ×2 IMPLANT
CHLORAPREP W/TINT 26ML (MISCELLANEOUS) ×2 IMPLANT
CLAMP CORD UMBIL (MISCELLANEOUS) IMPLANT
CLOTH BEACON ORANGE TIMEOUT ST (SAFETY) ×2 IMPLANT
DRAIN JACKSON PRT FLT 10 (DRAIN) IMPLANT
DRESSING PREVENA PLUS CUSTOM (GAUZE/BANDAGES/DRESSINGS) ×1 IMPLANT
DRSG OPSITE POSTOP 4X10 (GAUZE/BANDAGES/DRESSINGS) ×2 IMPLANT
DRSG PREVENA PLUS CUSTOM (GAUZE/BANDAGES/DRESSINGS) ×2
ELECT REM PT RETURN 9FT ADLT (ELECTROSURGICAL) ×2
ELECTRODE REM PT RTRN 9FT ADLT (ELECTROSURGICAL) ×1 IMPLANT
EVACUATOR SILICONE 100CC (DRAIN) IMPLANT
EXTRACTOR VACUUM KIWI (MISCELLANEOUS) ×2 IMPLANT
EXTRACTOR VACUUM M CUP 4 TUBE (SUCTIONS) IMPLANT
GLOVE BIO SURGEON STRL SZ 6.5 (GLOVE) ×2 IMPLANT
GLOVE BIOGEL PI IND STRL 7.0 (GLOVE) ×2 IMPLANT
GLOVE BIOGEL PI INDICATOR 7.0 (GLOVE) ×2
GOWN STRL REUS W/TWL LRG LVL3 (GOWN DISPOSABLE) ×4 IMPLANT
HEMOSTAT ARISTA ABSORB 3G PWDR (MISCELLANEOUS) ×2 IMPLANT
KIT ABG SYR 3ML LUER SLIP (SYRINGE) IMPLANT
KIT PREVENA INCISION MGT20CM45 (CANNISTER) ×2 IMPLANT
NEEDLE HYPO 25X5/8 SAFETYGLIDE (NEEDLE) IMPLANT
NS IRRIG 1000ML POUR BTL (IV SOLUTION) ×2 IMPLANT
PACK C SECTION WH (CUSTOM PROCEDURE TRAY) ×2 IMPLANT
PAD OB MATERNITY 4.3X12.25 (PERSONAL CARE ITEMS) ×2 IMPLANT
PENCIL SMOKE EVAC W/HOLSTER (ELECTROSURGICAL) ×2 IMPLANT
RETRACTOR TRAXI PANNICULUS (MISCELLANEOUS) ×1 IMPLANT
RTRCTR C-SECT PINK 25CM LRG (MISCELLANEOUS) IMPLANT
SPONGE LAP 18X18 RF (DISPOSABLE) ×6 IMPLANT
STRIP CLOSURE SKIN 1/2X4 (GAUZE/BANDAGES/DRESSINGS) ×2 IMPLANT
SUT CHROMIC 0 CT 1 (SUTURE) ×2 IMPLANT
SUT MNCRL AB 3-0 PS2 27 (SUTURE) ×2 IMPLANT
SUT PLAIN 2 0 (SUTURE) ×2
SUT PLAIN 2 0 XLH (SUTURE) ×2 IMPLANT
SUT PLAIN ABS 2-0 CT1 27XMFL (SUTURE) ×2 IMPLANT
SUT SILK 2 0 SH (SUTURE) IMPLANT
SUT VIC AB 0 CTX 36 (SUTURE) ×4
SUT VIC AB 0 CTX36XBRD ANBCTRL (SUTURE) ×4 IMPLANT
SUT VIC AB 2-0 SH 27 (SUTURE)
SUT VIC AB 2-0 SH 27XBRD (SUTURE) IMPLANT
TOWEL OR 17X24 6PK STRL BLUE (TOWEL DISPOSABLE) ×2 IMPLANT
TRAXI PANNICULUS RETRACTOR (MISCELLANEOUS) ×1
TRAY FOLEY W/BAG SLVR 14FR LF (SET/KITS/TRAYS/PACK) ×2 IMPLANT

## 2017-12-02 NOTE — Anesthesia Preprocedure Evaluation (Addendum)
Anesthesia Evaluation  Patient identified by MRN, date of birth, ID band Patient awake    Reviewed: Allergy & Precautions, NPO status , Patient's Chart, lab work & pertinent test results  History of Anesthesia Complications Negative for: history of anesthetic complications  Airway Mallampati: I  TM Distance: >3 FB Neck ROM: Full    Dental  (+) Dental Advisory Given, Teeth Intact   Pulmonary neg pulmonary ROS,    breath sounds clear to auscultation       Cardiovascular  Rhythm:Regular Rate:Normal     Neuro/Psych negative neurological ROS  negative psych ROS   GI/Hepatic Neg liver ROS, GERD  Controlled,  Endo/Other  diabetes, Type 2, Insulin Dependent Obesity   Renal/GU negative Renal ROS     Musculoskeletal negative musculoskeletal ROS (+)   Abdominal (+) + obese,   Peds  Hematology  (+) anemia ,  Thrombocytosis (PLT 437)    Anesthesia Other Findings   Reproductive/Obstetrics (+) Pregnancy  Hx preeclampsia with previous pregnancy                              Anesthesia Physical Anesthesia Plan  ASA: II  Anesthesia Plan: Spinal   Post-op Pain Management:    Induction:   PONV Risk Score and Plan: 2 and Treatment may vary due to age or medical condition, Ondansetron and Scopolamine patch - Pre-op  Airway Management Planned: Natural Airway  Additional Equipment: None  Intra-op Plan:   Post-operative Plan:   Informed Consent: I have reviewed the patients History and Physical, chart, labs and discussed the procedure including the risks, benefits and alternatives for the proposed anesthesia with the patient or authorized representative who has indicated his/her understanding and acceptance.     Plan Discussed with: CRNA and Anesthesiologist  Anesthesia Plan Comments: (Labs reviewed, platelets acceptable. Discussed risks and benefits of spinal, including spinal/epidural  hematoma, infection, failed block, and PDPH. Patient expressed understanding and wished to proceed. )       Anesthesia Quick Evaluation

## 2017-12-02 NOTE — MAU Provider Note (Signed)
History     CSN: [id number]  Arrival date and time: 12/02/17 0201   First Provider Initiated Contact with Patient 12/02/17 0253      Chief Complaint  Patient presents with  . Rupture of Membranes   HPI Julia Willis is a 27 y.o. G3P1000 at [redacted]w[redacted]d who presents with possible rupture of membranes. She reports a constant trickle of fluid all evening. She denies any pain or vaginal bleeding. Reports normal fetal movement. She is scheduled for repeat c/s on 11/25. Her first c/s was for non reassuring FHT and ended with a neonatal demise.   OB History    Gravida  3   Para  2   Term  1   Preterm      AB  0   Living  0     SAB  0   TAB      Ectopic      Multiple  1   Live Births  1           Past Medical History:  Diagnosis Date  . Acid reflux   . Diabetes mellitus    metformin  . Gonorrhea June 2013  . History of pre-eclampsia in prior pregnancy, currently pregnant   . Ovarian cyst   . Pre-eclampsia   . Thrombocytosis ([place])     Past Surgical History:  Procedure Laterality Date  . CESAREAN SECTION N/A 02/05/2017   Procedure: CESAREAN SECTION;  Surgeon: Thurnell Lose, MD;  Location: [facility];  Service: Obstetrics;  Laterality: N/A;  . NO PAST SURGERIES      Family History  Problem Relation Age of Onset  . Diabetes Mother   . Hypertension Father   . Heart disease Father   . Diabetes Maternal Aunt   . Hypertension Maternal Aunt   . Diabetes Maternal Uncle   . Hypertension Maternal Uncle   . Cancer Maternal Uncle   . Hypertension Paternal Aunt   . Hypertension Paternal Uncle     Social History   Tobacco Use  . Smoking status: Never Smoker  . Smokeless tobacco: Never Used  Substance Use Topics  . Alcohol use: No  . Drug use: No    Allergies: No Known Allergies  Medications Prior to Admission  Medication Sig Dispense Refill Last Dose  . insulin aspart (NOVOLOG) 100 UNIT/ML injection Inject 4 Units into the skin 3 (three) times  daily with meals. 10 mL 11   . insulin NPH Human (HUMULIN N,NOVOLIN N) 100 UNIT/ML injection Inject 0.24 mLs (24 Units total) into the skin daily before breakfast. 10 mL 11   . insulin NPH Human (HUMULIN N,NOVOLIN N) 100 UNIT/ML injection Inject 0.32 mLs (32 Units total) into the skin at bedtime. (Patient taking differently: Inject 32 Units into the skin every evening. ) 10 mL 11   . Prenatal Vit-Fe Fumarate-FA (PRENATAL VITAMIN PO) Take 1 tablet by mouth daily.    09/19/2017 at Unknown time  . glucose blood (TRUE METRIX BLOOD GLUCOSE TEST) test strip Use as instructed 100 each 12 Taking    Review of Systems  Constitutional: Negative.  Negative for fatigue and fever.  HENT: Negative.   Respiratory: Negative.  Negative for shortness of breath.   Cardiovascular: Negative.  Negative for chest pain.  Gastrointestinal: Negative.  Negative for abdominal pain, constipation, diarrhea, nausea and vomiting.  Genitourinary: Positive for vaginal discharge. Negative for dysuria.  Neurological: Negative.  Negative for dizziness and headaches.   Physical Exam   Blood pressure 124/85, pulse  84, temperature 97.6 F (36.4 C), resp. rate 20, height 5\' 6"  (1.676 m), weight 103.9 kg, last menstrual period 03/11/2017, unknown if currently breastfeeding.  Physical Exam  Nursing note and vitals reviewed. Constitutional: She is oriented to person, place, and time. She appears well-developed and well-nourished. No distress.  HENT:  Head: Normocephalic.  Eyes: Pupils are equal, round, and reactive to light.  Cardiovascular: Normal rate, regular rhythm and normal heart sounds.  Respiratory: Effort normal and breath sounds normal. No respiratory distress.  GI: Soft. Bowel sounds are normal. She exhibits no distension. There is no tenderness.  Neurological: She is alert and oriented to person, place, and time.  Skin: Skin is warm and dry.  Psychiatric: She has a normal mood and affect. Her behavior is normal.  Judgment and thought content normal.   Fetal Tracing:  Baseline: 150 Variability: min/moderate Accels: 10x10 Decels: none  Toco: none  MAU Course  Procedures Results for orders placed or performed during the hospital encounter of 12/02/17 (from the past 24 hour(s))  Amnisure rupture of membrane (rom)not at [facility]     Status: None   Collection Time: 12/02/17  2:51 AM  Result Value Ref Range   Amnisure ROM NEGATIVE   POCT fern test     Status: Normal   Collection Time: 12/02/17  2:57 AM  Result Value Ref Range   POCT Fern Test Negative = intact amniotic membranes   Glucose, capillary     Status: Abnormal   Collection Time: 12/02/17  5:22 AM  Result Value Ref Range   Glucose-Capillary 111 (H) 70 - 99 mg/dL   MDM Haynes Bast  NST reassuring but not reactive with periods of minimal variability [place] MFM BPP- 8/8 with normal AFI  After ultrasound, toco indicating that patient was having contractions every 2-4 minutes. Patient reports feeling intermittent contractions but not as frequent as being shown on monitoring. Cervix check and closed/thick/ballotable. Will recheck cervix in 1 hour due to hx of previous c/s and plan for repeat.  Consulted with Dr. Ilda Basset- recommends delivery due to contraction pattern and consistently non reactive NST  Dilation: 1.5 Effacement (%): 50 Station: -3 Presentation: Vertex Exam by:: Sharolyn Douglas CNM   Dr. Nelda Marseille called to notify of patient presentation and cervical change- prepare patient for repeat cesarean, start IV fluids and keep patient NPO   Assessment and Plan   1. Normal labor   2. [redacted] weeks gestation of pregnancy    -Prepare for OR -MD to assume care of patient  Wende Mott [facility] 12/02/2017, 5:41 AM

## 2017-12-02 NOTE — MAU Note (Signed)
About 0130 went to BR and leaked some clear fld after getting off toilet. Cont to leak clear fld at times. Did not wear in pad. For repeat C/S tomorrow.

## 2017-12-02 NOTE — Anesthesia Procedure Notes (Signed)
Spinal  Patient location during procedure: OR Start time: 12/02/2017 8:04 AM End time: 12/02/2017 8:08 AM Staffing Anesthesiologist: Audry Pili, MD Performed: anesthesiologist  Preanesthetic Checklist Completed: patient identified, surgical consent, pre-op evaluation, timeout performed, IV checked, risks and benefits discussed and monitors and equipment checked Spinal Block Patient position: sitting Prep: DuraPrep Patient monitoring: heart rate, cardiac monitor, continuous pulse ox and blood pressure Approach: midline Location: L2-3 Injection technique: single-shot Needle Needle type: Pencan  Needle gauge: 24 G Additional Notes Consent was obtained prior to the procedure with all questions answered and concerns addressed. Risks including, but not limited to, bleeding, infection, nerve damage, paralysis, failed block, inadequate analgesia, allergic reaction, high spinal, itching, and headache were discussed and the patient wished to proceed. Functioning IV was confirmed and monitors were applied. Sterile prep and drape, including hand hygiene, mask, and sterile gloves were used. The patient was positioned and the spine was prepped. The skin was anesthetized with lidocaine. Free flow of clear CSF was obtained prior to injecting local anesthetic into the CSF. The spinal needle aspirated freely following injection. The needle was carefully withdrawn. The patient tolerated the procedure well.   Renold Don, MD

## 2017-12-02 NOTE — H&P (Signed)
Julia Willis is a 27 y.o. female presenting for latent labor with history of prior cesarean section, repeat cesarean section moved to today. OB History    Gravida  3   Para  2   Term  1   Preterm      AB  0   Living  0     SAB  0   TAB      Ectopic      Multiple  1   Live Births  1          Past Medical History:  Diagnosis Date  . Acid reflux   . Diabetes mellitus    metformin  . Gonorrhea June 2013  . History of pre-eclampsia in prior pregnancy, currently pregnant   . Ovarian cyst   . Pre-eclampsia   . Thrombocytosis ([place])    Past Surgical History:  Procedure Laterality Date  . CESAREAN SECTION N/A 02/05/2017   Procedure: CESAREAN SECTION;  Surgeon: Thurnell Lose, MD;  Location: [facility];  Service: Obstetrics;  Laterality: N/A;  . NO PAST SURGERIES     Family History: family history includes Cancer in her maternal uncle; Diabetes in her maternal aunt, maternal uncle, and mother; Heart disease in her father; Hypertension in her father, maternal aunt, maternal uncle, paternal aunt, and paternal uncle. Social History:  reports that she has never smoked. She has never used smokeless tobacco. She reports that she does not drink alcohol or use drugs.     Maternal Diabetes: Yes:  Diabetes Type:  Pre-pregnancy, Insulin/Medication controlled Genetic Screening: Normal Maternal Ultrasounds/Referrals: Normal Fetal Ultrasounds or other Referrals:  Referred to Materal Fetal Medicine  Maternal Substance Abuse:  No Significant Maternal Medications:  Meds include: Other: Novolog and NPH insulin Significant Maternal Lab Results:  None Other Comments:  Patient had neonatal demise with last pregnancy, her uncontrolled DM2 was contributory. She has been closely monitored by [facility] and MFM for this pregnancy and her blood glucose has been much better controlled. Weekly testing from 32 weeks.   Review of Systems  Gastrointestinal: Positive for abdominal pain.    Maternal Medical History:  Reason for admission: Contractions.   Contractions: Onset was 3-5 hours ago.    Fetal activity: Perceived fetal activity is normal.   Last perceived fetal movement was within the past hour.    Prenatal Complications - Diabetes: type 2. Diabetes is managed by insulin injections.      Dilation: 1.5 Effacement (%): 50 Station: -3 Exam by:: Sharolyn Douglas CNM    Vitals:   12/02/17 0212 12/02/17 0213  BP:  124/85  Pulse:  84  Resp: 20   Temp: 97.6 F (36.4 C)   Weight: 103.9 kg   Height: 5\' 6"  (1.676 m)      Maternal Exam:  Uterine Assessment: Contraction strength is mild.  Abdomen: Patient reports no abdominal tenderness. Surgical scars: low transverse.   Fundal height is Size=dates.   Estimated fetal weight is 6.5lbs .   Fetal presentation: vertex  Introitus: Normal vulva. Normal vagina.    Fetal Exam Fetal Monitor Review: Mode: ultrasound.   Baseline rate: 160.  Variability: moderate (6-25 bpm).   Pattern: accelerations present and no decelerations.    Fetal State Assessment: Category I - tracings are normal.     Physical Exam  Nursing note and vitals reviewed. Constitutional: She is oriented to person, place, and time. She appears well-developed and well-nourished.  HENT:  Head: Normocephalic and atraumatic.  Eyes: Pupils are equal, round,  and reactive to light.  Cardiovascular: Normal rate, regular rhythm and normal heart sounds.  Respiratory: Effort normal and breath sounds normal. No respiratory distress.  GI: There is no tenderness.  Genitourinary: Vagina normal and uterus normal.  Musculoskeletal: Normal range of motion.  Neurological: She is alert and oriented to person, place, and time.  Skin: Skin is warm and dry.  Psychiatric: She has a normal mood and affect. Her behavior is normal. Judgment and thought content normal.    Prenatal labs: ABO, Rh: --/--/O POS (09/12 5400) Antibody: NEG (09/12 8676) Rubella: Immune  (05/07 0000) RPR: Nonreactive (05/07 0000)  HBsAg: Negative (05/07 0000)  HIV: Non-reactive (05/07 0000)  GBS: Positive (01/03 0000)   Assessment/Plan: G3P1 at 38w History of prior LTCS with fetal demise Diabetes mellitus II on insulin Latent labor Admit to preop Plan repeat cesarean section with Dr. Elige Ko [place] 12/02/2017, 6:42 AM

## 2017-12-02 NOTE — Progress Notes (Signed)
When asked about her plans for feeding baby, mom indicated that she would like to "possibly breastfeed" but wanted to wait until "tomorrow" to focus on it.  She has been given a hand pump and been seen by lactation; LEADS has also been discussed with her (and documented). Mom appears anxious and tired and is attempting to rest at this time.

## 2017-12-02 NOTE — Op Note (Signed)
PreOp Diagnosis:  -Intrauterine pregnancy @ [redacted]w[redacted]d -T2DM -Obesity -h/o fetal demise  PostOp Diagnosis: Same Procedure: Repeat C-section, Lysis of adhesions Surgeon: Dr. Janyth Pupa Assistant: Altha Harm, CNM Anesthesia: spinal Complications: none EBL: 235cc UOP: 150cc Fluids: 2000cc  Findings: Female infant from vertex presentation, normal uterus, tube and ovaries bilaterally.  Dense fascial tissue noted.  Rectus and fascial adhesions were noted.  Adhesions noted to bladder and small portion to the mid-portion of the uterus  PROCEDURE:  Informed consent was obtained from the patient with risks, benefits, complications, treatment options, and expected outcomes discussed with the patient.  The patient concurred with the proposed plan, giving informed consent with form signed.   The patient was taken to Operating Room, and identified with the procedure verified as C-Section Delivery with Time Out. With induction of anesthesia, the patient was prepped and draped in the usual sterile fashion. A Pfannenstiel incision was made and carried down through the subcutaneous tissue to the fascia. The fascia was incised in the midline and extended transversely with difficulty due to the density of the tissue. The inferior aspect of the fascial incision was grasped with Kochers elevated and the underlying muscle dissected off. The superior aspect of the facial incision was in similar fashion, grasped elevated and rectus muscles dissected off using both sharp and blunt dissection. The peritoneum was not identified due to adhesions- using the scalpel- thin incision was used to incise the adhesion and enter into the abdominal cavity.  The incision was extended using both sharp and blunt dissection.  The utero-vesical peritoneal reflection was identified and incised transversely with the [facility] scissors, the incision extended laterally, the bladder flap created digitally.  Alexis retractor was placed. A low  transverse uterine incision was made and the infants head delivered atraumatically with assistance of the [facility]. After the umbilical cord was clamped and cut cord blood was obtained for evaluation.   The placenta was removed intact and appeared normal. The uterine outline, tubes and ovaries appeared normal. The uterine incision was closed with running locked sutures of 0 Vicryl and a second layer of the same stitch was used in an imbricating fashion.  Excellent hemostasis was obtained.  The pericolic gutters were then cleared of all clots and debris. Interceed was placed.  Arista was placed over the rectus/prior adhesions.  The fascia was then reapproximated with running sutures of 0 Vicryl. The subcutaneous tissue was reapproximated with 2-0 plain gut suture.  The skin was closed with 4-0 vicryl in a subcuticular fashion.  Instrument, sponge, and needle counts were correct prior the abdominal closure and at the conclusion of the case. The patient was taken to recovery in stable condition.  Janyth Pupa, DO [phone] (cell) [phone] (office)

## 2017-12-02 NOTE — Lactation Note (Signed)
This note was copied from a baby's chart. Lactation Consultation Note  Patient Name: Julia Willis [id number]'O Date: 12/02/2017 Reason for consult: Initial assessment   P1, 68 5 hours old. Mother GDM.  Infant's BS 51.   Demonstrated hand expression and was able to spoon feed baby drops. Mother has large breasts with flat nipples that invert slightly when compressed. Taught mother how to prepump to stimulate breast. Attempted latching baby in football hold.  She did a few sucks and came off.  Assisted w/ mother to sit up further in bed.  Mother became teary eyed stating she was exhausted. Baby fell back asleep.  Discussed STS.  Mom encouraged to feed baby 8-12 times/24 hours and with feeding cues.  Left lactation brochure.      Maternal Data Has patient been taught Hand Expression?: Yes Does the patient have breastfeeding experience prior to this delivery?: No  Feeding Feeding Type: Breast Milk  LATCH Score Latch: Repeated attempts needed to sustain latch, nipple held in mouth throughout feeding, stimulation needed to elicit sucking reflex.  Audible Swallowing: None  Type of Nipple: Flat  Comfort (Breast/Nipple): Soft / non-tender  Hold (Positioning): Assistance needed to correctly position infant at breast and maintain latch.  LATCH Score: 5  Interventions Interventions: Assisted with latch;Hand express;Hand pump  Lactation Tools Discussed/Used     Consult Status Consult Status: Follow-up Date: 12/02/17 Follow-up type: In-patient    Vivianne Master [facility] 12/02/2017, 2:07 PM

## 2017-12-02 NOTE — Anesthesia Postprocedure Evaluation (Signed)
Anesthesia Post Note  Patient: Natally Ribera  Procedure(s) Performed: Repeat CESAREAN SECTION with Scar Revision (N/A )     Patient location during evaluation: PACU Anesthesia Type: Spinal Level of consciousness: awake and alert Pain management: pain level controlled Vital Signs Assessment: post-procedure vital signs reviewed and stable Respiratory status: spontaneous breathing and respiratory function stable Cardiovascular status: blood pressure returned to baseline and stable Postop Assessment: spinal receding and no apparent nausea or vomiting Anesthetic complications: no    Last Vitals:  Vitals:   12/02/17 1124 12/02/17 1230  BP: (!) 118/96 125/79  Pulse: 83 86  Resp: 18 18  Temp:  36.4 C  SpO2: 96% 98%    Last Pain:  Vitals:   12/02/17 1230  TempSrc: Oral  PainSc: 0-No pain   Pain Goal: Patients Stated Pain Goal: 0 (12/02/17 0217)               Audry Pili

## 2017-12-02 NOTE — Progress Notes (Signed)
Parent request formula to supplement breast feeding due to mother states" I just want my baby to eat enough so I would like to give a bottle." Parents have been informed of small tummy size of newborn, taught hand expression and understands the possible consequences of formula to the health of the infant. The possible consequences shared with patent include 1) Loss of confidence in breastfeeding 2) Engorgement 3) Allergic sensitization of baby(asthema/allergies) and 4) decreased milk supply for mother.After discussion of the above the mother decided to breast and bottle feed. The  tool used to give formula supplement will be bottles.

## 2017-12-02 NOTE — Transfer of Care (Signed)
Immediate Anesthesia Transfer of Care Note  Patient: Julia Willis  Procedure(s) Performed: Repeat CESAREAN SECTION with Scar Revision (N/A )  Patient Location: PACU  Anesthesia Type:Spinal  Level of Consciousness: awake and alert   Airway & Oxygen Therapy: Patient Spontanous Breathing  Post-op Assessment: Report given to RN and Post -op Vital signs reviewed and stable  Post vital signs: Reviewed  Last Vitals:  Vitals Value Taken Time  BP 104/77 12/02/2017  9:43 AM  Temp    Pulse 85 12/02/2017  9:45 AM  Resp    SpO2 100 % 12/02/2017  9:45 AM  Vitals shown include unvalidated device data.  Last Pain:  Vitals:   12/02/17 0217  PainSc: 6       Patients Stated Pain Goal: 0 (87/56/43 3295)  Complications: No apparent anesthesia complications

## 2017-12-02 NOTE — Anesthesia Postprocedure Evaluation (Signed)
Anesthesia Post Note  Patient: Julia Willis  Procedure(s) Performed: Repeat CESAREAN SECTION with Scar Revision (N/A )     Patient location during evaluation: Mother Baby Anesthesia Type: Spinal Level of consciousness: awake and alert and oriented Pain management: satisfactory to patient Vital Signs Assessment: post-procedure vital signs reviewed and stable Respiratory status: respiratory function stable and spontaneous breathing Cardiovascular status: blood pressure returned to baseline Postop Assessment: no headache, no backache, spinal receding, patient able to bend at knees and adequate PO intake Anesthetic complications: no    Last Vitals:  Vitals:   12/02/17 1324 12/02/17 1430  BP: 108/74 129/81  Pulse: 89 87  Resp: 18 18  Temp: 36.8 C 36.8 C  SpO2: 98% 98%    Last Pain:  Vitals:   12/02/17 1430  TempSrc: Oral  PainSc: 0-No pain   Pain Goal: Patients Stated Pain Goal: 0 (12/02/17 0217)               Katherina Mires

## 2017-12-02 NOTE — Progress Notes (Signed)
Risk benefits and alternatives of cesarean section were discussed with the patient including but not limited to infection, bleeding, damage to bowel , bladder and baby with the need for further surgery. Pt voiced understanding and desires to proceed.   Janyth Pupa, DO [phone] (cell) [phone] (office)

## 2017-12-02 NOTE — Addendum Note (Signed)
Addendum  created 12/02/17 1555 by Flossie Dibble, CRNA   Sign clinical note

## 2017-12-03 LAB — GLUCOSE, CAPILLARY
GLUCOSE-CAPILLARY: 102 mg/dL — AB (ref 70–99)
GLUCOSE-CAPILLARY: 70 mg/dL (ref 70–99)
GLUCOSE-CAPILLARY: 116 mg/dL — AB (ref 70–99)
GLUCOSE-CAPILLARY: 108 mg/dL — AB (ref 70–99)
Glucose-Capillary: 95 mg/dL (ref 70–99)
Glucose-Capillary: 72 mg/dL (ref 70–99)

## 2017-12-03 LAB — CBC
HEMATOCRIT: 28.5 % — AB (ref 36.0–46.0)
Hemoglobin: 8.5 g/dL — ABNORMAL LOW (ref 12.0–15.0)
MCH: 22 pg — ABNORMAL LOW (ref 26.0–34.0)
MCHC: 29.8 g/dL — ABNORMAL LOW (ref 30.0–36.0)
MCV: 73.6 fL — AB (ref 80.0–100.0)
NRBC: 0 % (ref 0.0–0.2)
PLATELETS: 385 10*3/uL (ref 150–400)
RBC: 3.87 MIL/uL (ref 3.87–5.11)
RDW: 17.9 % — ABNORMAL HIGH (ref 11.5–15.5)
WBC: 5.6 10*3/uL (ref 4.0–10.5)

## 2017-12-03 LAB — CREATININE, SERUM: CREATININE: 0.48 mg/dL (ref 0.44–1.00)

## 2017-12-03 LAB — BIRTH TISSUE RECOVERY COLLECTION (PLACENTA DONATION)

## 2017-12-03 MED ORDER — FERROUS SULFATE 325 (65 FE) MG PO TABS
325.0000 mg | ORAL_TABLET | Freq: Every day | ORAL | Status: DC
  Administered 2017-12-04 – 2017-12-05 (×2): 325 mg via ORAL
  Filled 2017-12-03 (×3): qty 1

## 2017-12-03 NOTE — Lactation Note (Signed)
This note was copied from a baby's chart. Lactation Consultation Note  Patient Name: Julia Willis OZHYQ'[id number] Date: 12/03/2017 Reason for consult: Follow-up assessment;Early term 74-38.6wks Mom states she is not putting baby to breast because it was too overwhelming.  Mom would like to continue to formula feed but agreeable to pumping.  Symphony pump set up and initiated.  Instructed to pump every 3 hours x 15 minutes.  Encouraged to call out with concerns of latch assist.  Maternal Data    Feeding    LATCH Score                   Interventions    Lactation Tools Discussed/Used Pump Review: Setup, frequency, and cleaning;Milk Storage Initiated by:: LM Date initiated:: 12/03/17   Consult Status Consult Status: Follow-up Date: 12/04/17 Follow-up type: In-patient    Ave Filter 12/03/2017, 3:13 PM

## 2017-12-03 NOTE — Progress Notes (Addendum)
Morning fasting blood sugar 70.  Patient given apple juice and packet of graham crackers. Follow up sugar 95.

## 2017-12-03 NOTE — Progress Notes (Signed)
Subjective: Postpartum Day 1: Cesarean Delivery Patient reports tolerating PO, + flatus and no problems voiding. She reports her pain is well controlled. Mild asymptomatic anemia.   Objective: Vitals:   12/02/17 2215 12/03/17 0204 12/03/17 0504 12/03/17 0755  BP: 113/79 117/67 114/76 122/89  Pulse: 85 73 75 88  Resp: 18 18 18 18   Temp: 98.7 F (37.1 C) 97.6 F (36.4 C) 97.6 F (36.4 C) 98.5 F (36.9 C)  TempSrc: Oral  Oral   SpO2: 97% 96% 97%   Weight:      Height:       Physical Exam:  General: alert and cooperative Lochia: appropriate Uterine Fundus: firm Incision: healing well, no significant drainage, no dehiscence, no significant erythema DVT Evaluation: No evidence of DVT seen on physical exam. Negative Homan's sign. No cords or calf tenderness. No significant calf/ankle edema. Results for orders placed or performed during the hospital encounter of 12/02/17 (from the past 24 hour(s))  Glucose, capillary     Status: Abnormal   Collection Time: 12/02/17  5:51 PM  Result Value Ref Range   Glucose-Capillary 113 (H) 70 - 99 mg/dL  Glucose, capillary     Status: None   Collection Time: 12/02/17 10:04 PM  Result Value Ref Range   Glucose-Capillary 94 70 - 99 mg/dL  Glucose, capillary     Status: None   Collection Time: 12/03/17  5:27 AM  Result Value Ref Range   Glucose-Capillary 70 70 - 99 mg/dL  CBC     Status: Abnormal   Collection Time: 12/03/17  5:39 AM  Result Value Ref Range   WBC 5.6 4.0 - 10.5 K/uL   RBC 3.87 3.87 - 5.11 MIL/uL   Hemoglobin 8.5 (L) 12.0 - 15.0 g/dL   HCT 28.5 (L) 36.0 - 46.0 %   MCV 73.6 (L) 80.0 - 100.0 fL   MCH 22.0 (L) 26.0 - 34.0 pg   MCHC 29.8 (L) 30.0 - 36.0 g/dL   RDW 17.9 (H) 11.5 - 15.5 %   Platelets 385 150 - 400 K/uL   nRBC 0.0 0.0 - 0.2 %  Collect bld for placenta donatation     Status: None   Collection Time: 12/03/17  5:39 AM  Result Value Ref Range   Placenta donation bld collect COLLECTED BY LABORATORY   Creatinine,  serum     Status: None   Collection Time: 12/03/17  5:39 AM  Result Value Ref Range   Creatinine, Ser 0.48 0.44 - 1.00 mg/dL   GFR calc non Af Amer >60 >60 mL/min   GFR calc Af Amer >60 >60 mL/min  Glucose, capillary     Status: None   Collection Time: 12/03/17  6:43 AM  Result Value Ref Range   Glucose-Capillary 95 70 - 99 mg/dL    Assessment/Plan: Status post Cesarean section. Doing well postoperatively.  Blood sugars are well controlled. Continue current care.  Started PO Iron for mild asymptomatic anemia.  Marikay Alar 12/03/2017, 8:53 AM

## 2017-12-04 LAB — GLUCOSE, CAPILLARY
GLUCOSE-CAPILLARY: 87 mg/dL (ref 70–99)
GLUCOSE-CAPILLARY: 66 mg/dL — AB (ref 70–99)
GLUCOSE-CAPILLARY: 111 mg/dL — AB (ref 70–99)
GLUCOSE-CAPILLARY: 85 mg/dL (ref 70–99)
Glucose-Capillary: 82 mg/dL (ref 70–99)
Glucose-Capillary: 99 mg/dL (ref 70–99)

## 2017-12-04 MED ORDER — INSULIN NPH (HUMAN) (ISOPHANE) 100 UNIT/ML ~~LOC~~ SUSP
10.0000 [IU] | Freq: Every evening | SUBCUTANEOUS | Status: DC
  Administered 2017-12-04: 10 [IU] via SUBCUTANEOUS

## 2017-12-04 MED ORDER — ENOXAPARIN SODIUM 60 MG/0.6ML ~~LOC~~ SOLN
50.0000 mg | SUBCUTANEOUS | Status: DC
  Administered 2017-12-05: 50 mg via SUBCUTANEOUS
  Filled 2017-12-04 (×2): qty 0.6

## 2017-12-04 MED ORDER — INSULIN NPH (HUMAN) (ISOPHANE) 100 UNIT/ML ~~LOC~~ SUSP: 6.0000 [IU] | Freq: Every day | SUBCUTANEOUS | Status: DC

## 2017-12-04 NOTE — Progress Notes (Signed)
Follow up blood sugar 111 reported to MD

## 2017-12-04 NOTE — Lactation Note (Signed)
This note was copied from a baby's chart. Lactation Consultation Note  Patient Name: Julia Willis Date: 12/04/2017   Per D. Nicola Girt, RN, no need for [facility] to see this patient.   Matthias Hughs [facility] 12/04/2017, 4:14 PM

## 2017-12-04 NOTE — Progress Notes (Signed)
Subjective: Postpartum Day 3: Cesarean Delivery Patient reports tolerating PO, + flatus and no problems voiding. Mild asymptomatic anemia.   Objective: Vitals:   12/04/17 0532 12/04/17 1500 12/04/17 2202 12/05/17 0543  BP: 117/71 131/78 118/75 127/89  Pulse: 76 82 81 75  Resp: 18 18 14 16   Temp: 98.3 F (36.8 C) 98.3 F (36.8 C) 98 F (36.7 C) 97.9 F (36.6 C)  TempSrc: Oral Oral Oral Oral  SpO2: 100%  98% 100%  Weight:      Height:       Results for orders placed or performed during the hospital encounter of 12/02/17 (from the past 24 hour(s))  Glucose, capillary     Status: None   Collection Time: 12/04/17  7:17 AM  Result Value Ref Range   Glucose-Capillary 87 70 - 99 mg/dL  Glucose, capillary     Status: Abnormal   Collection Time: 12/04/17  9:54 AM  Result Value Ref Range   Glucose-Capillary 66 (L) 70 - 99 mg/dL  Glucose, capillary     Status: Abnormal   Collection Time: 12/04/17 10:36 AM  Result Value Ref Range   Glucose-Capillary 111 (H) 70 - 99 mg/dL  Glucose, capillary     Status: None   Collection Time: 12/04/17  2:50 PM  Result Value Ref Range   Glucose-Capillary 85 70 - 99 mg/dL  Glucose, capillary     Status: None   Collection Time: 12/04/17  8:14 PM  Result Value Ref Range   Glucose-Capillary 82 70 - 99 mg/dL  Glucose, capillary     Status: None   Collection Time: 12/04/17 11:19 PM  Result Value Ref Range   Glucose-Capillary 99 70 - 99 mg/dL  Glucose, capillary     Status: None   Collection Time: 12/05/17  5:40 AM  Result Value Ref Range   Glucose-Capillary 96 70 - 99 mg/dL   Physical Exam:  General: alert and cooperative Lochia: appropriate Uterine Fundus: firm Incision: healing well, no significant drainage, no dehiscence, no significant erythema, Prevena wound vaccuum in place with good suction  DVT Evaluation: No evidence of DVT seen on physical exam. Negative Homan's sign. No cords or calf tenderness. No significant calf/ankle  edema.  Recent Labs    12/03/17 0539  HGB 8.5*  HCT 28.5*    Assessment/Plan: Status post Cesarean section. Doing well postoperatively.  Continue PO Iron QD for mild asymptomatic anemia. Discharge home today.   Marikay Alar 12/05/2017, 6:42 AM

## 2017-12-04 NOTE — Progress Notes (Signed)
Patient asymptomatic with 66 blood sugar. Given breakfast and apple juice.

## 2017-12-04 NOTE — Progress Notes (Signed)
Julia Willis [id number] Postpartum Postoperative Day # 2  Julia Willis, G3P2001, [redacted]w[redacted]d, S/P Repeat LT Cesarean Section due to desired c/s.   Subjective: Patient up ad lib, denies syncope or dizziness. Reports consuming regular diet without issues and denies N/V. Patient reports 0bowel movement + passing flatus.  Denies issues with urination and reports bleeding is "light."  Patient is Breastfeeding and reports going well.  Desires nexplanon for postpartum contraception.  Pain is being appropriately managed with use of po meds. P has DM2 controlled with insulin, was on metformin prior to pregnancy. Pt has wound vacuum in place over incision, denies complaints. Asymptomatic anemia.    Objective: Patient Vitals for the past 24 hrs:  BP Temp Temp src Pulse Resp SpO2  12/03/17 2213 125/79 98.2 F (36.8 C) Oral 90 20 -  12/03/17 1537 112/69 98.7 F (37.1 C) Oral 85 18 -  12/03/17 0755 122/89 98.5 F (36.9 C) - 88 18 100 %  12/03/17 0504 114/76 97.6 F (36.4 C) Oral 75 18 97 %     Physical Exam:  General: alert, cooperative, appears stated age and no distress Mood/Affect: Happy Lungs: clear to auscultation, no wheezes, rales or rhonchi, symmetric air entry.  Heart: normal rate, regular rhythm, normal S1, S2, no murmurs, rubs, clicks or gallops. Breast: breasts appear normal, no suspicious masses, no skin or nipple changes or axillary nodes. Abdomen:  + bowel sounds, soft, non-tender Incision: healing well, no significant drainage, no dehiscence, no significant erythema, wound vacum in place, no drainage noted on dressing. , Honeycomb dressing  Uterine Fundus: firm, involution -2 Lochia: appropriate Skin: Warm, Dry. DVT Evaluation: No evidence of DVT seen on physical exam. Negative Homan's sign. No cords or calf tenderness. No significant calf/ankle edema.  Labs: Recent Labs    12/02/17 0634 12/03/17 0539  HGB 10.1* 8.5*  HCT 33.9* 28.5*  WBC 5.6 5.6    CBG (last 3)  Recent  Labs    12/03/17 0920 12/03/17 1437 12/03/17 2028  GLUCAP 72 116* 108*     I/O: I/O last 3 completed shifts: In: 2720 [P.O.:720; I.V.:2000] Out: 2135 [Urine:1900; Blood:235]   Assessment Postpartum Postoperative Day # 2. Julia Willis, G3P2001, [redacted]w[redacted]d, S/P repeat LT Cesarean Section due to elective and DM2 controlled with insulin during pregnancy was on metformin pre-pregnancy .  Pt stable. -2 Involution. BreastFeeding. HBG post-op 8.5. Hemodynamically Stable. Last 2 Hour PP was 108.   Plan: Continue other mgmt as ordered VTE Prophylactics: SCD, ambulated as tolerates. On prophylactic Lovenox 40mg  daily. Anemia: On Iron DM2: On insulin, controlled, may think about switching back to metformin now that she is PP, pt to continue on 12units NPH AM, and 16 units nightly Pain control: Motrin/Tylenol/Narcotics PRN Education given regarding options for contraception, including injectable contraception, IUD placement.  Discharge home, Breastfeeding and Contraception Nexplanon @ 6 weeks PPV.   Dr. Cletis Media to be updated on patient status.  Teea Ducey NP-C, CNM 12/04/2017, 4:06 AM

## 2017-12-05 LAB — GLUCOSE, CAPILLARY
GLUCOSE-CAPILLARY: 71 mg/dL (ref 70–99)
GLUCOSE-CAPILLARY: 67 mg/dL — AB (ref 70–99)
Glucose-Capillary: 96 mg/dL (ref 70–99)
Glucose-Capillary: 101 mg/dL — ABNORMAL HIGH (ref 70–99)

## 2017-12-05 MED ORDER — METFORMIN HCL ER 500 MG PO TB24
500.0000 mg | ORAL_TABLET | Freq: Every day | ORAL | Status: DC
  Administered 2017-12-05: 500 mg via ORAL
  Filled 2017-12-05 (×2): qty 1

## 2017-12-05 MED ORDER — OXYCODONE HCL 5 MG PO TABS: 5.0000 mg | ORAL_TABLET | ORAL | 0 refills | Status: AC | PRN

## 2017-12-05 MED ORDER — METFORMIN HCL ER 500 MG PO TB24: 500.0000 mg | ORAL_TABLET | Freq: Every day | ORAL | 3 refills | Status: AC

## 2017-12-05 MED ORDER — IBUPROFEN 600 MG PO TABS: 600.0000 mg | ORAL_TABLET | Freq: Four times a day (QID) | ORAL | 1 refills | Status: AC | PRN

## 2017-12-05 NOTE — Plan of Care (Signed)
Appropriate for discharge

## 2017-12-05 NOTE — Discharge Summary (Signed)
Obstetric Discharge Summary Reason for Admission: cesarean section presenting in latent labor Prenatal Procedures: NST and ultrasound Intrapartum Procedures: repeat c-section Postpartum Procedures: wound vac care, monitoring CBGs Complications-Operative and Postpartum: none Hemoglobin  Date Value Ref Range Status  12/03/2017 8.5 (L) 12.0 - 15.0 g/dL Final   HCT  Date Value Ref Range Status  12/03/2017 28.5 (L) 36.0 - 46.0 % Final    Physical Exam:  General: alert and no distress Lochia: appropriate Uterine Fundus: firm Incision: dehiscence present wound vac DVT Evaluation: No evidence of DVT seen on physical exam.  Discharge Diagnoses: Term Pregnancy-delivered and Diabetes  Discharge Information: Date: 12/05/2017 Activity: pelvic rest Diet: diabetic diet Medications: Ibuprofen, Percocet and metformin XR 500mg  daily Condition: improved Instructions: refer to practice specific booklet Discharge to: home f/u for ppv in 6wks F/u in 1wk to f/u CBGs Remove wound vac 7 days from delivery  Newborn Data: Live born female  Birth Weight: 6 lb 9.6 oz (2995 g) APGAR: 8, 9  Newborn Delivery   Birth date/time:  12/02/2017 08:44:00 Delivery type:  C-Section, Vacuum Assisted Trial of labor:  No C-section categorization:  Repeat     Home with mother.  Delice Lesch 12/05/2017, 6:47 PM

## 2017-12-05 NOTE — Lactation Note (Signed)
This note was copied from a baby's chart. Lactation Consultation Note  Patient Name: Julia Willis Date: 12/05/2017 Reason for consult: Follow-up assessment  P1 mother whose infant is now 36 hours old.  This is an ETI at 38+0 weeks.  Mother is interested in breast/ bottle feeding.  She had no questions/concerns related to breast feeding.  She did not seem real interested in speaking with me.  Engorgement prevention/treatment discussed.  Manual pump given with instructions for use.  Mother does not have a DEBP at home.  Offered the [facility] [address] loaner pump but she declined.  She has met with [facility] in hospital but did not receive a pump.  Mother chose the formula package.  She has our OP phone number for questions/concerns after discharge.   Maternal Data Formula Feeding for Exclusion: No Has patient been taught Hand Expression?: Yes Does the patient have breastfeeding experience prior to this delivery?: Yes  Feeding    LATCH Score                   Interventions    Lactation Tools Discussed/Used WIC Program: Yes Pump Review: Setup, frequency, and cleaning;Milk Storage Initiated by:: Nabeel Gladson Date initiated:: 12/05/17   Consult Status Consult Status: Complete Date: 12/05/17 Follow-up type: Call as needed    Tenaya Hilyer R Robbye Dede 12/05/2017, 10:26 AM

## 2017-12-05 NOTE — Progress Notes (Signed)
Changed to large blood pressure cuff 

## 2018-05-11 ENCOUNTER — Encounter (HOSPITAL_COMMUNITY): Payer: Self-pay | Admitting: *Deleted

## 2018-05-11 ENCOUNTER — Emergency Department (HOSPITAL_COMMUNITY)
Admission: EM | Admit: 2018-05-11 | Discharge: 2018-05-11 | Disposition: A | Discharge status: Home / Self Care | Payer: Medicaid Other | Attending: Emergency Medicine | Admitting: Emergency Medicine

## 2018-05-11 ENCOUNTER — Other Ambulatory Visit: Payer: Self-pay

## 2018-05-11 DIAGNOSIS — Y929 Unspecified place or not applicable: Secondary | ICD-10-CM | POA: Insufficient documentation

## 2018-05-11 DIAGNOSIS — Y939 Activity, unspecified: Secondary | ICD-10-CM | POA: Insufficient documentation

## 2018-05-11 DIAGNOSIS — E119 Type 2 diabetes mellitus without complications: Secondary | ICD-10-CM | POA: Insufficient documentation

## 2018-05-11 DIAGNOSIS — W268XXA Contact with other sharp object(s), not elsewhere classified, initial encounter: Secondary | ICD-10-CM | POA: Insufficient documentation

## 2018-05-11 DIAGNOSIS — Z7984 Long term (current) use of oral hypoglycemic drugs: Secondary | ICD-10-CM | POA: Insufficient documentation

## 2018-05-11 DIAGNOSIS — S61210A Laceration without foreign body of right index finger without damage to nail, initial encounter: Secondary | ICD-10-CM | POA: Insufficient documentation

## 2018-05-11 DIAGNOSIS — Z23 Encounter for immunization: Secondary | ICD-10-CM | POA: Insufficient documentation

## 2018-05-11 DIAGNOSIS — Y999 Unspecified external cause status: Secondary | ICD-10-CM | POA: Insufficient documentation

## 2018-05-11 MED ORDER — LIDOCAINE HCL (PF) 1 % IJ SOLN
30.0000 mL | Freq: Once | INTRAMUSCULAR | Status: DC
  Filled 2018-05-11: qty 30

## 2018-05-11 MED ORDER — TETANUS-DIPHTH-ACELL PERTUSSIS 5-2.5-18.5 LF-MCG/0.5 IM SUSP
0.5000 mL | Freq: Once | INTRAMUSCULAR | Status: AC
  Administered 2018-05-11: 0.5 mL via INTRAMUSCULAR
  Filled 2018-05-11: qty 0.5

## 2018-05-11 MED ORDER — CEPHALEXIN 500 MG PO CAPS: ORAL_CAPSULE | ORAL | 0 refills | Status: DC

## 2018-05-11 NOTE — ED Provider Notes (Signed)
[place] EMERGENCY DEPARTMENT Provider Note   CSN: [id number] Arrival date & time: 05/11/18  1118    History   Chief Complaint Chief Complaint  Patient presents with  . Laceration    HPI Julia Willis is a 28 y.o. female     The history is provided by the patient and medical records. No language interpreter was used.  Laceration  Location:  Finger Finger laceration location:  R index finger Length:  1 Depth:  Cutaneous Quality: straight   Bleeding: controlled   Time since incident: 1. Laceration mechanism:  Metal edge Pain details:    Quality:  Aching   Severity:  Mild   Timing:  Constant   Progression:  Improving Foreign body present:  No foreign bodies Relieved by:  Certain positions Worsened by:  Movement Ineffective treatments:  None tried Tetanus status:  Unknown Associated symptoms: no fever, no focal weakness, no numbness, no rash, no redness, no swelling and no streaking     Past Medical History:  Diagnosis Date  . Acid reflux   . Diabetes mellitus    metformin  . Gonorrhea June 2013  . History of pre-eclampsia in prior pregnancy, currently pregnant   . Ovarian cyst   . Pre-eclampsia   . Thrombocytosis [facility])     Patient Active Problem List   Diagnosis Date Noted  . Normal intrauterine pregnancy in third trimester 12/02/2017  . Anemia 11/23/2017  . Gestational diabetes requiring insulin 09/20/2017  . History of cesarean section 05/19/2017  . History of pre-eclampsia May 19, 2017  . History of neonatal death 2017/05/19  . Diabetes mellitus affecting pregnancy in third trimester 02/05/2017  . DM (diabetes mellitus), type 2 ([place]) 08/26/2016  . UTI (urinary tract infection) in pregnancy, antepartum, second trimester 08/26/2016  . Thrombocytosis ([place]) 08/26/2016  . Pregnancy 07/17/2016  . High-risk pregnancy supervision 05/13/2012    Past Surgical History:  Procedure Laterality Date  . CESAREAN SECTION N/A 02/05/2017   Procedure: CESAREAN SECTION;  Surgeon: Thurnell Lose, MD;  Location: [place];  Service: Obstetrics;  Laterality: N/A;  . CESAREAN SECTION N/A 12/02/2017   Procedure: Repeat CESAREAN SECTION with Scar Revision;  Surgeon: Janyth Pupa, DO;  Location: [place];  Service: Obstetrics;  Laterality: N/A;  Heather, RNFA  . NO PAST SURGERIES       OB History    Gravida  3   Para  3   Term  2   Preterm      AB  0   Living  1     SAB  0   TAB      Ectopic      Multiple  1   Live Births  2            Home Medications    Prior to Admission medications   Medication Sig Start Date End Date Taking? Authorizing Provider  ibuprofen (ADVIL,MOTRIN) 600 MG tablet Take 1 tablet (600 mg total) by mouth every 6 (six) hours as needed. 12/05/17   Everett Graff, MD  metFORMIN (GLUCOPHAGE-XR) 500 MG 24 hr tablet Take 1 tablet (500 mg total) by mouth daily. 12/06/17   Everett Graff, MD  oxyCODONE (OXY IR/ROXICODONE) 5 MG immediate release tablet Take 1 tablet (5 mg total) by mouth every 4 (four) hours as needed (pain scale 4-7). 12/05/17   Everett Graff, MD    Family History Family History  Problem Relation Age of Onset  . Diabetes Mother   . Hypertension Father   .  Heart disease Father   . Diabetes Maternal Aunt   . Hypertension Maternal Aunt   . Diabetes Maternal Uncle   . Hypertension Maternal Uncle   . Cancer Maternal Uncle   . Hypertension Paternal Aunt   . Hypertension Paternal Uncle     Social History Social History   Tobacco Use  . Smoking status: Never Smoker  . Smokeless tobacco: Never Used  Substance Use Topics  . Alcohol use: No  . Drug use: No     Allergies   Patient has no known allergies.   Review of Systems Review of Systems  Constitutional: Negative for fever.  Musculoskeletal: Negative for joint swelling.  Skin: Positive for wound. Negative for rash.  Neurological: Negative for focal weakness, weakness and numbness.      Physical Exam Updated Vital Signs BP (!) 126/91 (BP Location: Right Arm)   Pulse (!) 110   Temp 98 F (36.7 C) (Oral)   Resp 16   Ht 5\' 6"  (1.676 m)   Wt 95.3 kg   LMP 05/11/2018   SpO2 98%   BMI 33.89 kg/m   Physical Exam Vitals signs and nursing note reviewed.  Constitutional:      General: She is not in acute distress.    Appearance: She is well-developed. She is not diaphoretic.  HENT:     Head: Normocephalic and atraumatic.  Eyes:     General: No scleral icterus.    Conjunctiva/sclera: Conjunctivae normal.  Neck:     Musculoskeletal: Normal range of motion.  Cardiovascular:     Rate and Rhythm: Normal rate and regular rhythm.     Heart sounds: Normal heart sounds. No murmur. No friction rub. No gallop.   Pulmonary:     Effort: Pulmonary effort is normal. No respiratory distress.     Breath sounds: Normal breath sounds.  Abdominal:     General: Bowel sounds are normal. There is no distension.     Palpations: Abdomen is soft. There is no mass.     Tenderness: There is no abdominal tenderness. There is no guarding.  Musculoskeletal:     Comments: 1 cm superficial laceration of the right index finger over the dorsal surface of the PIP.  No active bleeding.  Full strength and range of motion of the finger.  Cap refill less than 2 seconds  Skin:    General: Skin is warm and dry.  Neurological:     Mental Status: She is alert and oriented to person, place, and time.  Psychiatric:        Behavior: Behavior normal.      ED Treatments / Results  Labs (all labs ordered are listed, but only abnormal results are displayed) Labs Reviewed - No data to display  EKG None  Radiology No results found.  Procedures .Marland KitchenLaceration Repair Date/Time: 05/11/2018 12:46 PM Performed by: Margarita Mail, PA-C Authorized by: Margarita Mail, PA-C   Consent:    Consent obtained:  Verbal   Consent given by:  Patient   Risks discussed:  Pain and poor cosmetic result    Alternatives discussed:  No treatment Anesthesia (see MAR for exact dosages):    Anesthesia method:  None Laceration details:    Location:  Finger   Finger location:  R index finger   Length (cm):  1   Depth (mm):  2 Repair type:    Repair type:  Simple Pre-procedure details:    Preparation:  Patient was prepped and draped in usual sterile fashion Exploration:  Wound exploration: wound explored through full range of motion     Wound extent: no muscle damage noted, no nerve damage noted, no tendon damage noted and no vascular damage noted     Contaminated: no   Treatment:    Area cleansed with:  Shur-Clens   Amount of cleaning:  Standard   Irrigation solution:  Sterile saline   Irrigation method:  Syringe Skin repair:    Repair method:  Tissue adhesive Approximation:    Approximation:  Close Post-procedure details:    Dressing:  Adhesive bandage   Patient tolerance of procedure:  Tolerated well, no immediate complications .Splint Application Date/Time: 04/16/2498 12:50 PM Performed by: Margarita Mail, PA-C Authorized by: Margarita Mail, PA-C   Consent:    Consent obtained:  Verbal   Consent given by:  Patient   Risks discussed:  Discoloration and numbness   Alternatives discussed:  No treatment Pre-procedure details:    Sensation:  Normal Procedure details:    Laterality:  Right   Location:  Finger   Finger:  R index finger   Strapping: no     Supplies:  Aluminum splint Post-procedure details:    Pain:  Unchanged   Sensation:  Normal   Patient tolerance of procedure:  Tolerated well, no immediate complications   (including critical care time)  Medications Ordered in ED Medications  lidocaine (PF) (XYLOCAINE) 1 % injection 30 mL (has no administration in time range)  Tdap (BOOSTRIX) injection 0.5 mL (0.5 mLs Intramuscular Given 05/11/18 1217)     Initial Impression / Assessment and Plan / ED Course  I have reviewed the triage vital signs and the nursing  notes.  Pertinent labs & imaging results that were available during my care of the patient were reviewed by me and considered in my medical decision making (see chart for details).        Tdap booster given.Pressure irrigation performed. Laceration occurred < 8 hours prior to repair which was well tolerated. Pt has DM- given keflex RX to hold for signs of infection, which we discussed. Discussed tissue adhesive home care w pt and answered questions. Pt to f-u for wound check and suture removal in 7 days. Pt is hemodynamically stable w no complaints prior to dc.     Final Clinical Impressions(s) / ED Diagnoses   Final diagnoses:  None    ED Discharge Orders    None       Margarita Mail, PA-C 05/11/18 1252    Charlesetta Shanks, MD 05/11/18 [phone]

## 2018-05-11 NOTE — ED Triage Notes (Signed)
PT reports cutting RT index finger on metal can. Pt reports unsure when last tetanus shot was.

## 2018-05-11 NOTE — ED Notes (Signed)
Patient verbalizes understanding of discharge instructions . Opportunity for questions and answers were provided . Armband removed by staff ,Pt discharged from ED. W/C  offered at D/C  and Declined W/C at D/C and was escorted to lobby by RN.  

## 2018-05-11 NOTE — Discharge Instructions (Signed)
Get help right away if: Your wound reopens and is draining. Your wound becomes red, swollen, hot, or tender. You develop a rash after the glue is applied. You have increasing pain in the wound. You have a red streak going away from the wound. You have pus coming from the wound. You have increased bleeding. You have a fever. You have shaking chills. You notice a bad smell coming from the wound. Your wound or the adhesive breaks open. 

## 2018-10-01 ENCOUNTER — Encounter (HOSPITAL_COMMUNITY): Payer: Self-pay | Admitting: *Deleted

## 2018-10-01 ENCOUNTER — Emergency Department (HOSPITAL_COMMUNITY)
Admission: EM | Admit: 2018-10-01 | Discharge: 2018-10-01 | Disposition: A | Discharge status: Home / Self Care | Payer: Medicaid Other | Attending: Emergency Medicine | Admitting: Emergency Medicine

## 2018-10-01 DIAGNOSIS — E119 Type 2 diabetes mellitus without complications: Secondary | ICD-10-CM | POA: Insufficient documentation

## 2018-10-01 DIAGNOSIS — Z7984 Long term (current) use of oral hypoglycemic drugs: Secondary | ICD-10-CM | POA: Insufficient documentation

## 2018-10-01 DIAGNOSIS — R51 Headache: Secondary | ICD-10-CM | POA: Insufficient documentation

## 2018-10-01 DIAGNOSIS — R479 Unspecified speech disturbances: Secondary | ICD-10-CM | POA: Insufficient documentation

## 2018-10-01 LAB — CBC
HCT: 39.4 % (ref 36.0–46.0)
Hemoglobin: 12.1 g/dL (ref 12.0–15.0)
MCH: 24.7 pg — ABNORMAL LOW (ref 26.0–34.0)
MCHC: 30.7 g/dL (ref 30.0–36.0)
MCV: 80.4 fL (ref 80.0–100.0)
Platelets: 379 10*3/uL (ref 150–400)
RBC: 4.9 MIL/uL (ref 3.87–5.11)
RDW: 15.3 % (ref 11.5–15.5)
WBC: 6 10*3/uL (ref 4.0–10.5)
nRBC: 0 % (ref 0.0–0.2)

## 2018-10-01 LAB — BASIC METABOLIC PANEL
Anion gap: 7 (ref 5–15)
BUN: 8 mg/dL (ref 6–20)
CO2: 22 mmol/L (ref 22–32)
Calcium: 9.3 mg/dL (ref 8.9–10.3)
Chloride: 106 mmol/L (ref 98–111)
Creatinine, Ser: 0.37 mg/dL — ABNORMAL LOW (ref 0.44–1.00)
GFR calc Af Amer: >60 mL/min (ref >60)
GFR calc non Af Amer: >60 mL/min (ref >60)
Glucose, Bld: 350 mg/dL — ABNORMAL HIGH (ref 70–99)
Potassium: 4.3 mmol/L (ref 3.5–5.1)
Sodium: 135 mmol/L (ref 135–145)

## 2018-10-01 LAB — I-STAT BETA HCG BLOOD, ED (MC, WL, AP ONLY): I-stat hCG, quantitative: <5 m[IU]/mL (ref <5)

## 2018-10-01 MED ORDER — SODIUM CHLORIDE 0.9% FLUSH: 3.0000 mL | Freq: Once | INTRAVENOUS | Status: DC

## 2018-10-01 MED ORDER — SODIUM CHLORIDE 0.9 % IV SOLN
20.00 | INTRAVENOUS | Status: DC
Start: ? — End: 2018-10-01

## 2018-10-01 NOTE — ED Provider Notes (Signed)
[place] EMERGENCY DEPARTMENT Provider Note   CSN: [id number] Arrival date & time: 10/01/18  0335     History   Chief Complaint Chief Complaint  Patient presents with  . Stuttering  . Near Syncope    HPI Julia Willis is a 28 y.o. female with a hx of GERD, DM, thrombocytosis, & anemia who presents to the ED w/ complaints of syncopal episode yesterday with intermittent stuttering type speech since. Patient states yesterday afternoon she was ambulating to the bathroom she became somewhat dizzy/lightheaded & passed out. She is unsure if she hit her head. Her significant other realized what had happened & called 911. She had brief LOC and upon coming back to she has had a frontal headache & stuttering speech which has continued intermittently. States speech stuttering is aggravated by being upset alleviated by being calm. She was seen @ [facility] ER yesterday as a code stroke- has thorough work-up as reviewed below. Day prior to syncope she started to have some generalized body aches & chils which she thought my abe covid related. Speech difficulty has persisted, she states she was told this was anxiety, patient is concerned prompting ED visit today. Denies visual disturbance, numbness, weakness, dizziness like the room spinning, chest pain, or dyspnea. She has passed out once previously related to a panic attack but states this was different.      HPI  Past Medical History:  Diagnosis Date  . Acid reflux   . Diabetes mellitus    metformin  . Gonorrhea June 2013  . History of pre-eclampsia in prior pregnancy, currently pregnant   . Ovarian cyst   . Pre-eclampsia   . Thrombocytosis [facility])     Patient Active Problem List   Diagnosis Date Noted  . Normal intrauterine pregnancy in third trimester 12/02/2017  . Anemia 11/23/2017  . Gestational diabetes requiring insulin 09/20/2017  . History of cesarean section 2017/05/19  . History of pre-eclampsia 05-19-17  .  History of neonatal death 05/19/2017  . Diabetes mellitus affecting pregnancy in third trimester 02/05/2017  . DM (diabetes mellitus), type 2 ([place]) 08/26/2016  . UTI (urinary tract infection) in pregnancy, antepartum, second trimester 08/26/2016  . Thrombocytosis ([place]) 08/26/2016  . Pregnancy 07/17/2016  . High-risk pregnancy supervision 05/13/2012    Past Surgical History:  Procedure Laterality Date  . CESAREAN SECTION N/A 02/05/2017   Procedure: CESAREAN SECTION;  Surgeon: Thurnell Lose, MD;  Location: [place];  Service: Obstetrics;  Laterality: N/A;  . CESAREAN SECTION N/A 12/02/2017   Procedure: Repeat CESAREAN SECTION with Scar Revision;  Surgeon: Janyth Pupa, DO;  Location: [place];  Service: Obstetrics;  Laterality: N/A;  Heather, RNFA  . NO PAST SURGERIES       OB History    Gravida  3   Para  3   Term  2   Preterm      AB  0   Living  1     SAB  0   TAB      Ectopic      Multiple  1   Live Births  2            Home Medications    Prior to Admission medications   Medication Sig Start Date End Date Taking? Authorizing Provider  cephALEXin (KEFLEX) 500 MG capsule 2 caps po bid x 7 days 05/11/18   Margarita Mail, PA-C  ibuprofen (ADVIL,MOTRIN) 600 MG tablet Take 1 tablet (600 mg total) by mouth every  6 (six) hours as needed. 12/05/17   Everett Graff, MD  metFORMIN (GLUCOPHAGE-XR) 500 MG 24 hr tablet Take 1 tablet (500 mg total) by mouth daily. 12/06/17   Everett Graff, MD  oxyCODONE (OXY IR/ROXICODONE) 5 MG immediate release tablet Take 1 tablet (5 mg total) by mouth every 4 (four) hours as needed (pain scale 4-7). 12/05/17   Everett Graff, MD    Family History Family History  Problem Relation Age of Onset  . Diabetes Mother   . Hypertension Father   . Heart disease Father   . Diabetes Maternal Aunt   . Hypertension Maternal Aunt   . Diabetes Maternal Uncle   . Hypertension Maternal Uncle   . Cancer Maternal Uncle    . Hypertension Paternal Aunt   . Hypertension Paternal Uncle     Social History Social History   Tobacco Use  . Smoking status: Never Smoker  . Smokeless tobacco: Never Used  Substance Use Topics  . Alcohol use: No  . Drug use: No     Allergies   Patient has no known allergies.   Review of Systems Review of Systems  Constitutional: Positive for chills. Negative for fever.  Respiratory: Negative for chest tightness and shortness of breath.   Cardiovascular: Negative for chest pain.  Gastrointestinal: Negative for anal bleeding and vomiting.  Genitourinary: Negative for dysuria.  Musculoskeletal: Positive for myalgias.  Neurological: Positive for syncope, speech difficulty, light-headedness (resolved) and headaches. Negative for facial asymmetry, weakness and numbness.  All other systems reviewed and are negative.    Physical Exam Updated Vital Signs BP 139/90 (BP Location: Right Arm)   Pulse 98   Temp 98.9 F (37.2 C) (Oral)   Resp 16   SpO2 99%   Physical Exam Vitals signs and nursing note reviewed.  Constitutional:      General: She is not in acute distress.    Appearance: She is well-developed. She is not toxic-appearing.  HENT:     Head: Normocephalic and atraumatic.     Comments: No raccoon eyes or battle sign.    Ears:     Comments: No hemotympanum.    Nose: Nose normal.     Mouth/Throat:     Mouth: Mucous membranes are moist.     Pharynx: No oropharyngeal exudate or posterior oropharyngeal erythema.     Comments: Uvula midline Eyes:     General:        Right eye: No discharge.        Left eye: No discharge.     Extraocular Movements: Extraocular movements intact.     Conjunctiva/sclera: Conjunctivae normal.     Pupils: Pupils are equal, round, and reactive to light.  Neck:     Musculoskeletal: Normal range of motion and neck supple.     Comments: No midline tenderness. Cardiovascular:     Rate and Rhythm: Normal rate and regular rhythm.   Pulmonary:     Effort: Pulmonary effort is normal. No respiratory distress.     Breath sounds: Normal breath sounds. No wheezing, rhonchi or rales.  Abdominal:     General: There is no distension.     Palpations: Abdomen is soft.     Tenderness: There is no abdominal tenderness. There is no guarding or rebound.  Musculoskeletal:     Comments: Intact active range of motion throughout bilateral upper and lower extremities.  No point/focal bone tenderness.  No point/focal midline spinal tenderness.  No palpable step-off.  Skin:    General: Skin  is warm and dry.     Capillary Refill: Capillary refill takes less than 2 seconds.     Findings: No rash.  Neurological:     Mental Status: She is alert.     Comments: Clear speech with intermittent stuttering- no aphasia/dysarthria. No facial droop. CNIII-XII grossly intact. Bilateral upper and lower extremities' sensation grossly intact. 5/5 symmetric strength with grip strength and with plantar and dorsi flexion bilaterally.  . Normal finger to nose bilaterally. Negative pronator drift. Negative Romberg sign. Gait is intact.   Psychiatric:     Comments: Intermittently tearful throughout exam.      ED Treatments / Results  Labs (all labs ordered are listed, but only abnormal results are displayed) Labs Reviewed  BASIC METABOLIC PANEL - Abnormal; Notable for the following components:      Result Value   Glucose, Bld 350 (*)    Creatinine, Ser 0.37 (*)    All other components within normal limits  CBC - Abnormal; Notable for the following components:   MCH 24.7 (*)    All other components within normal limits  URINALYSIS, ROUTINE W REFLEX MICROSCOPIC  I-STAT BETA HCG BLOOD, ED ([facility], [facility], [facility] ONLY)    EKG EKG Interpretation  Date/Time:  Tuesday October 01 2018 04:10:31 EDT Ventricular Rate:  85 PR Interval:  140 QRS Duration: 72 QT Interval:  366 QTC Calculation: 435 R Axis:   76 Text Interpretation:  Normal sinus rhythm Normal ECG  Confirmed by Gerlene Fee [phone]) on 10/01/2018 8:40:44 AM   Radiology No results found.  Procedures Procedures (including critical care time)  Medications Ordered in ED Medications  sodium chloride flush (NS) 0.9 % injection 3 mL (has no administration in time range)     Initial Impression / Assessment and Plan / ED Course  I have reviewed the triage vital signs and the nursing notes.  Pertinent labs & imaging results that were available during my care of the patient were reviewed by me and considered in my medical decision making (see chart for details).   Patient presents to the emergency department with complaints of waxing/waning stuttering type speech since syncopal episode yesterday. She was seen @ [facility] ER yesterday for same, work-up has been reviewed:   CT head code stroke: no acute intracranial abnormality.  MRI brain wo contrast: Negative MRI of the brain, no acute or focal anomalies are identified.  CBC: Baseline anemia. No leukocytosis.  CMP: Hyperglycemia, not in DKA. No significant electrolyte derangement.  PT/INR/PTT: WNL Troponin: WNL Evaluated by teleneurology.   Evaluation by our triage staff reviewed:  EKG: NSR CBC: No anemia/leukocytosis.  BMP: mildly low creatinine. Hyperglycemia w/o acidosis or anion gap elevation.  Preg test: negative.   --> On assessment she is nontoxic appearing, resting comfortably, vitals WNL. She does have intermittent stuttering with speech but it is otherwise clear without aphasia/dysarthrita. She has no focal neurologic deficits present. She is intermittently tearful throughout exam. Given overall reassuring neurologic exam w/ thorough work-up which included an MRI of the brain yesterday for sxs do not feel further emergent work-up is indicated @ this time. Unclear etiology possibly psychogenic, possibly post-concussive. Feel she would benefit from outpatient neurology & psychiatry follow up. Encouraged rest & good hydration. I  discussed results,  need for follow-up, and return precautions with the patient & her significant other at length. Provided opportunity for questions, patient & her significant other confirmed understanding and are in agreement with plan.   Findings and plan of care discussed  with supervising physician Dr. Sedonia Small who is in agreement.    Final Clinical Impressions(s) / ED Diagnoses   Final diagnoses:  Speech disturbance, unspecified type    ED Discharge Orders         Ordered    Ambulatory referral to Neurology    Comments: An appointment is requested in approximately: 1 week   10/01/18 0914    Ambulatory referral to Psychiatry     10/01/18 0914           Amaryllis Dyke, PA-C 10/01/18 0920    Maudie Flakes, MD 10/04/18 [phone]

## 2018-10-01 NOTE — ED Triage Notes (Signed)
To ED for further eval of stuttering and near syncope 9/21 am. Pt was seen at [facility] ED and worked up with labs, ECG, Ct Scan, and MRI. Seen by neurology and told it's stress. Pt is tearful in triage here. Stuttering intermittently. Pt states she left [place] because she didn't have a good experience there and didn't get the answers she wanted. Appears in nad. Moves all extremities freely. No facial droop noted

## 2018-10-01 NOTE — ED Notes (Signed)
Family at bedside. 

## 2018-10-01 NOTE — Discharge Instructions (Addendum)
You were seen in the emergency department today for passing out and a speech abnormality.  Your work-up in the emergency department was overall reassuring.  Your blood sugar was high, please continue to monitor this at home and have this rechecked by primary care within 1 to 2 weeks.  Your labs otherwise did not show substantial abnormalities.  Your EKG was normal.  Your imaging from yesterday's ER visit has been reviewed, your MRI of your brain and the CT of your head did not show any significant abnormalities.  We would like you to follow-up with both neurology and psychiatry, we have placed referrals to each of these offices, they will be calling you to schedule an appointment soon.  We have also provided office phone numbers in your discharge instructions so that you may contact them.  Please follow-up with these specialist as well as your primary care provider within a week.  Return to the ER for new or worsening symptoms or any other concerns.  Return to the ER for: - worsening symptoms - numbness/weakness to an arm/leg- not using a certain side - passing out - seizure activity - slurred speech or inability to get words out.  - Any other concerns.   Please be sure to rest plenty.  Check your blood sugars daily

## 2018-10-28 NOTE — Progress Notes (Deleted)
NEUROLOGY CONSULTATION NOTE  Julia Willis MRN: [id number] DOB: [birth year]  Referring provider: Kennith Maes, PA-C Primary care provider: No PCP  Reason for consult:  Speech disturbance  HISTORY OF PRESENT ILLNESS: Julia Willis is a 28 year old ***-handed black female with diabetes mellitus and thrombocytosis who presents for speech disturbance.  History supplemented by ED note.  On 09/29/2018, she developed generalized body aches and chills, but ***.  She was concerned that she may have COVID-19.   09/30/2018, she was walking to the bathroom when she suddenly became lightheaded and passed out.  ***.  She is unsure if she hit her head.  The event was unwitnessed but her boyfriend ***.  She was brought to the ED at [facility] as a stroke code.  CT head showed no acute intracranial abnormality.  Followed up MRI of brain ***.  She exhibited no evidence of aphasia or motor weakness or numbness.  She was noted to be tearful.  She was found not to be a candidate for tPA.  Stroke was not suspected.  She was told that symptoms due to anxiety.  She does report that she had a prior syncopal episode due to a panic attack, but that this was different.  She presented to the [facility] ED the following day for further evaluation.  Exam was normal.    PAST MEDICAL HISTORY: Past Medical History:  Diagnosis Date  . Acid reflux   . Diabetes mellitus    metformin  . Gonorrhea June 2013  . History of pre-eclampsia in prior pregnancy, currently pregnant   . Ovarian cyst   . Pre-eclampsia   . Thrombocytosis ([place])     PAST SURGICAL HISTORY: Past Surgical History:  Procedure Laterality Date  . CESAREAN SECTION N/A 02/05/2017   Procedure: CESAREAN SECTION;  Surgeon: Thurnell Lose, MD;  Location: [place];  Service: Obstetrics;  Laterality: N/A;  . CESAREAN SECTION N/A 12/02/2017   Procedure: Repeat CESAREAN SECTION with Scar Revision;  Surgeon: Janyth Pupa,  DO;  Location: [place];  Service: Obstetrics;  Laterality: N/A;  Heather, RNFA  . NO PAST SURGERIES      MEDICATIONS: Current Outpatient Medications on File Prior to Visit  Medication Sig Dispense Refill  . cephALEXin (KEFLEX) 500 MG capsule 2 caps po bid x 7 days 28 capsule 0  . ibuprofen (ADVIL,MOTRIN) 600 MG tablet Take 1 tablet (600 mg total) by mouth every 6 (six) hours as needed. 30 tablet 1  . metFORMIN (GLUCOPHAGE-XR) 500 MG 24 hr tablet Take 1 tablet (500 mg total) by mouth daily. 90 tablet 3  . oxyCODONE (OXY IR/ROXICODONE) 5 MG immediate release tablet Take 1 tablet (5 mg total) by mouth every 4 (four) hours as needed (pain scale 4-7). 20 tablet 0   No current facility-administered medications on file prior to visit.     ALLERGIES: No Known Allergies  FAMILY HISTORY: Family History  Problem Relation Age of Onset  . Diabetes Mother   . Hypertension Father   . Heart disease Father   . Diabetes Maternal Aunt   . Hypertension Maternal Aunt   . Diabetes Maternal Uncle   . Hypertension Maternal Uncle   . Cancer Maternal Uncle   . Hypertension Paternal Aunt   . Hypertension Paternal Uncle    ***.  SOCIAL HISTORY: Social History   Socioeconomic History  . Marital status: Single    Spouse name: Not on file  . Number of children: Not on file  .  Years of education: Not on file  . Highest education level: Not on file  Occupational History  . Not on file  Social Needs  . Financial resource strain: Not hard at all  . Food insecurity    Worry: Never true    Inability: Never true  . Transportation needs    Medical: No    Non-medical: Not on file  Tobacco Use  . Smoking status: Never Smoker  . Smokeless tobacco: Never Used  Substance and Sexual Activity  . Alcohol use: No  . Drug use: No  . Sexual activity: Yes    Birth control/protection: None  Lifestyle  . Physical activity    Days per week: Not on file    Minutes per session: Not on file  .  Stress: Not at all  Relationships  . Social Herbalist on phone: Not on file    Gets together: Not on file    Attends religious service: Not on file    Active member of club or organization: Not on file    Attends meetings of clubs or organizations: Not on file    Relationship status: Not on file  . Intimate partner violence    Fear of current or ex partner: No    Emotionally abused: No    Physically abused: No    Forced sexual activity: No  Other Topics Concern  . Not on file  Social History Narrative  . Not on file    REVIEW OF SYSTEMS: Constitutional: No fevers, chills, or sweats, no generalized fatigue, change in appetite Eyes: No visual changes, double vision, eye pain Ear, nose and throat: No hearing loss, ear pain, nasal congestion, sore throat Cardiovascular: No chest pain, palpitations Respiratory:  No shortness of breath at rest or with exertion, wheezes GastrointestinaI: No nausea, vomiting, diarrhea, abdominal pain, fecal incontinence Genitourinary:  No dysuria, urinary retention or frequency Musculoskeletal:  No neck pain, back pain Integumentary: No rash, pruritus, skin lesions Neurological: as above Psychiatric: No depression, insomnia, anxiety Endocrine: No palpitations, fatigue, diaphoresis, mood swings, change in appetite, change in weight, increased thirst Hematologic/Lymphatic:  No purpura, petechiae. Allergic/Immunologic: no itchy/runny eyes, nasal congestion, recent allergic reactions, rashes  PHYSICAL EXAM: *** General: No acute distress.  Patient appears ***-groomed.  *** Head:  Normocephalic/atraumatic Eyes:  fundi examined but not visualized Neck: supple, no paraspinal tenderness, full range of motion Back: No paraspinal tenderness Heart: regular rate and rhythm Lungs: Clear to auscultation bilaterally. Vascular: No carotid bruits. Neurological Exam: Mental status: alert and oriented to person, place, and time, recent and remote  memory intact, fund of knowledge intact, attention and concentration intact, speech fluent and not dysarthric, language intact. Cranial nerves: CN I: not tested CN II: pupils equal, round and reactive to light, visual fields intact CN III, IV, VI:  full range of motion, no nystagmus, no ptosis CN V: facial sensation intact CN VII: upper and lower face symmetric CN VIII: hearing intact CN IX, X: gag intact, uvula midline CN XI: sternocleidomastoid and trapezius muscles intact CN XII: tongue midline Bulk & Tone: normal, no fasciculations. Motor:  5/5 throughout *** Sensation:  Pinprick *** temperature *** and vibration sensation intact.  ***. Deep Tendon Reflexes:  2+ throughout, *** toes downgoing.  *** Finger to nose testing:  Without dysmetria.  *** Heel to shin:  Without dysmetria.  *** Gait:  Normal station and stride.  Able to turn and tandem walk. [place] ***.  IMPRESSION: ***  PLAN: ***  Thank you for allowing me to take part in the care of this patient.  Metta Clines, DO

## 2018-10-29 ENCOUNTER — Ambulatory Visit: Payer: Medicaid Other | Admitting: Neurology

## 2018-12-15 IMAGING — CR DG CHEST 2V
2 series · 2 of 2 positions shown · non-contrast
Comparison: 08/26/2014

CLINICAL DATA: Pt is 27 wks pregnant with chest cold (UPI) for 1
wk, developed cough over past 53hours, denies pain, fever or chills,
priors 10/24/2010

EXAM:
CHEST - 2 VIEW

[chest pa]
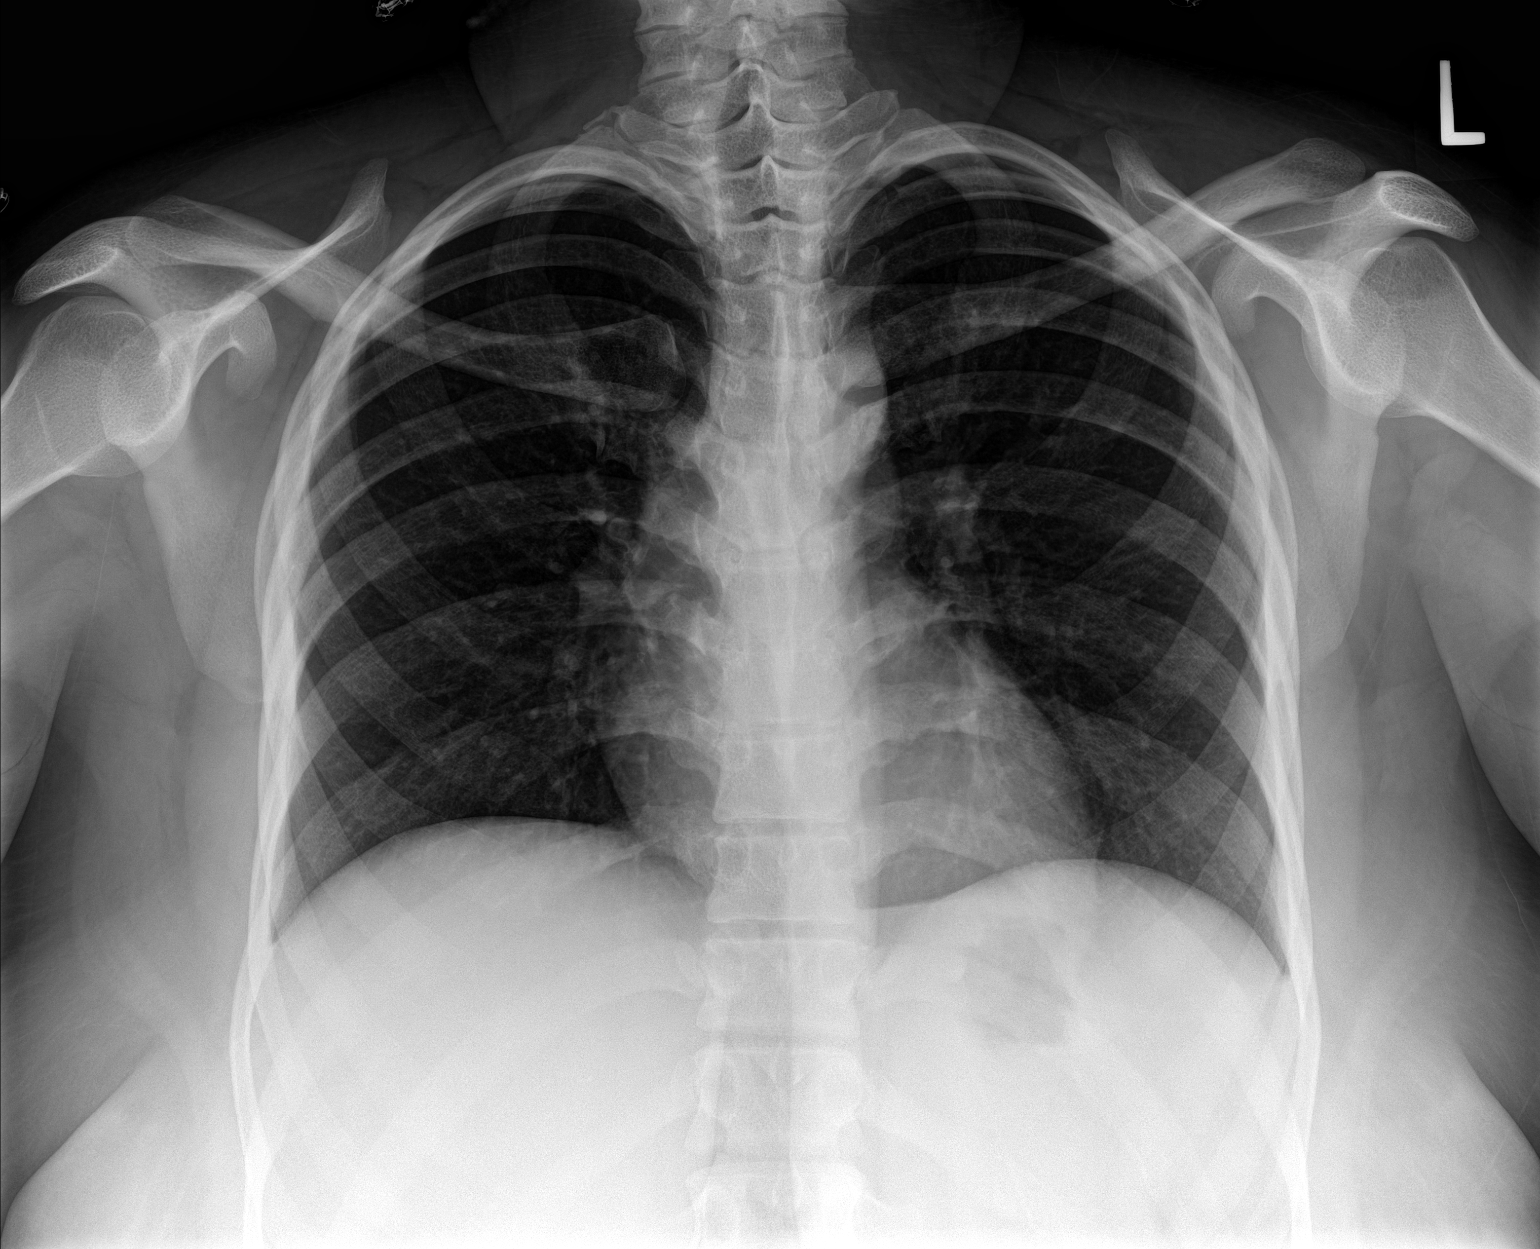

[chest lat]
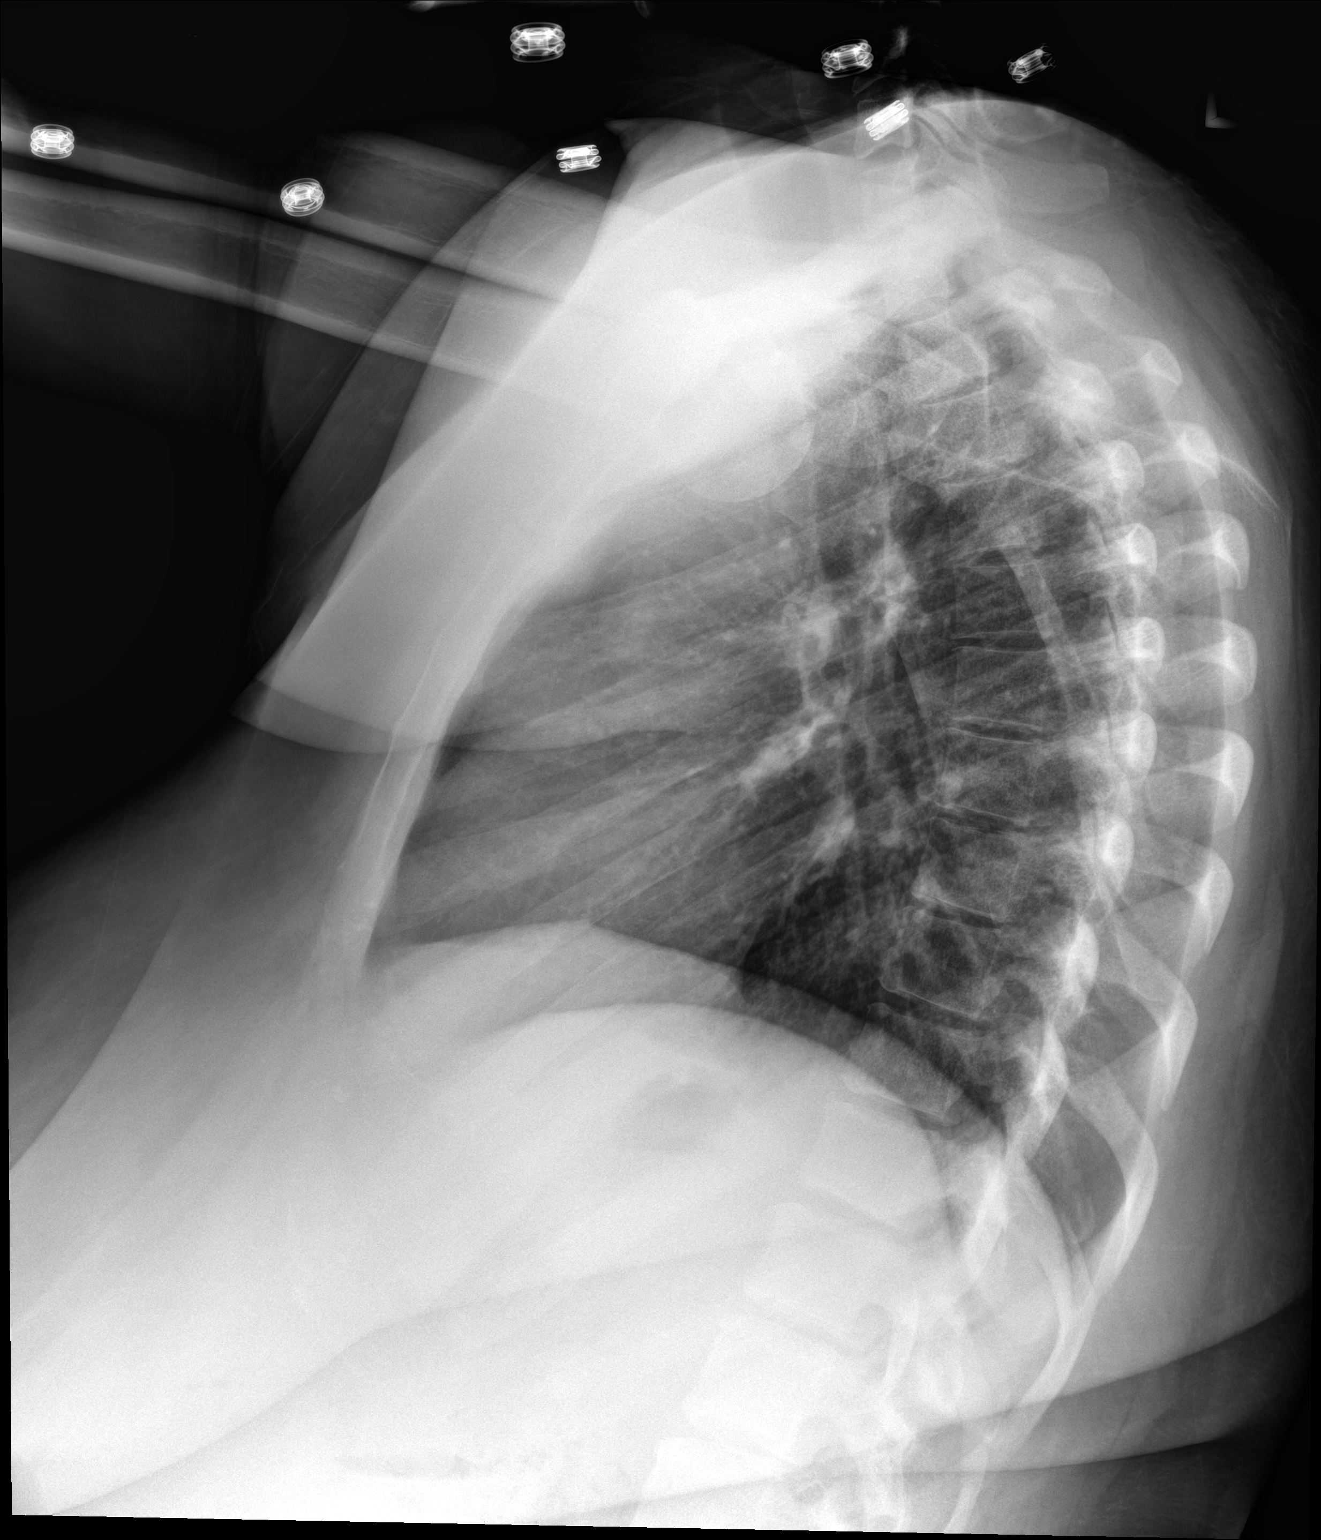

[2 of 2 positions shown; findings below may reference images not displayed]

FINDINGS: The heart size and mediastinal contours are within normal limits.
Both lungs are clear. The visualized skeletal structures are
unremarkable.
IMPRESSION: No active cardiopulmonary disease.

## 2018-12-15 IMAGING — US US MFM OB FOLLOW-UP
1 series · 14 of 28 positions shown · non-contrast
Comparison: none

[Series 1: us mfm ob follow-up · 50 acquisitions, 14 frames shown]
[im 2/50]
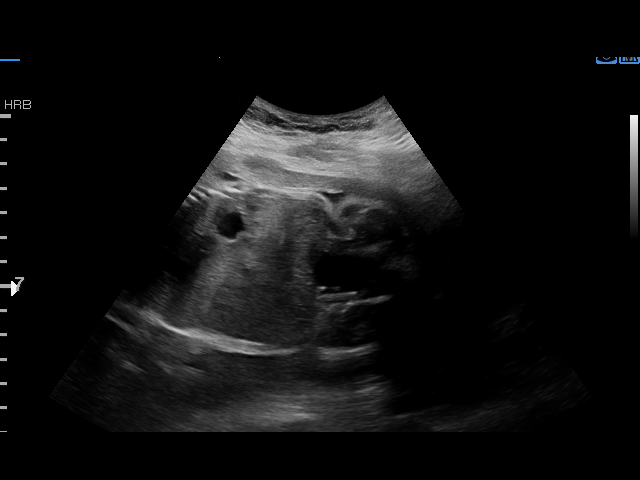
[im 6/50]
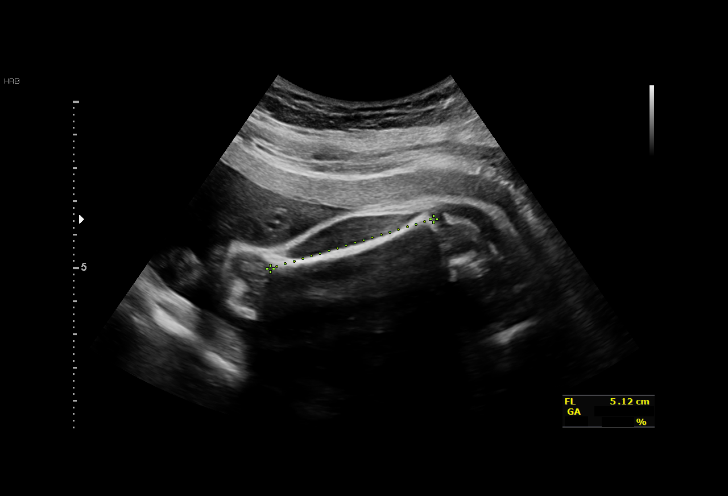
[im 10/50]
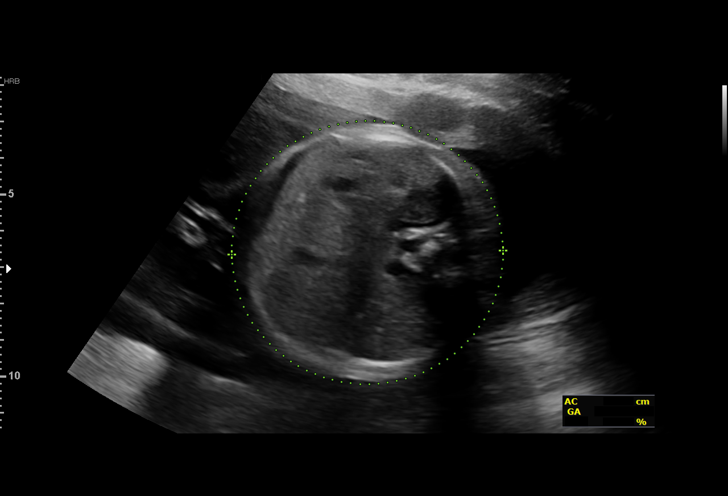
[im 13/50]
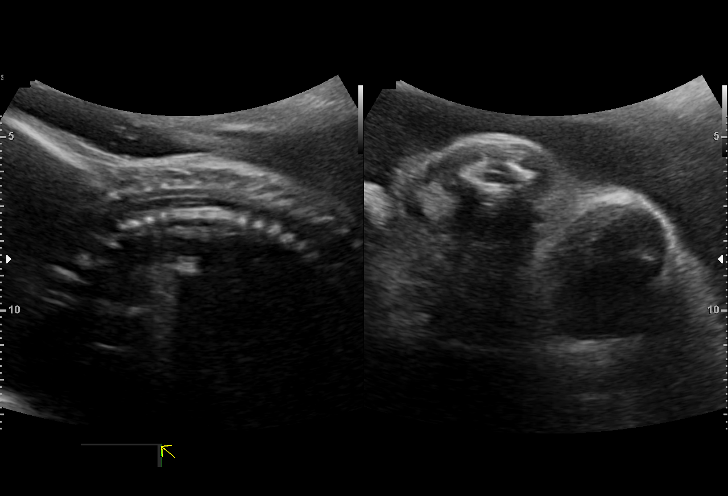
[im 17/50]
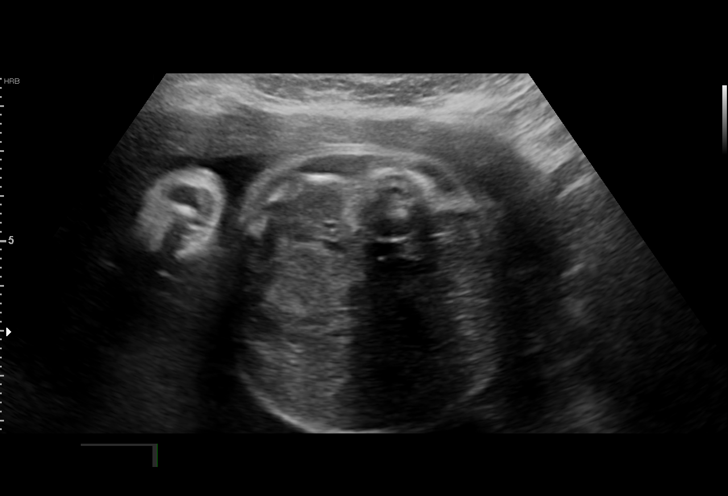
[im 20/50]
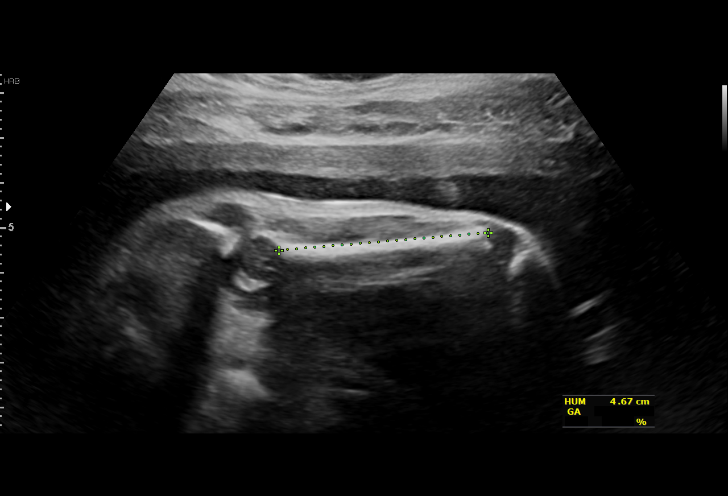
[im 24/50]
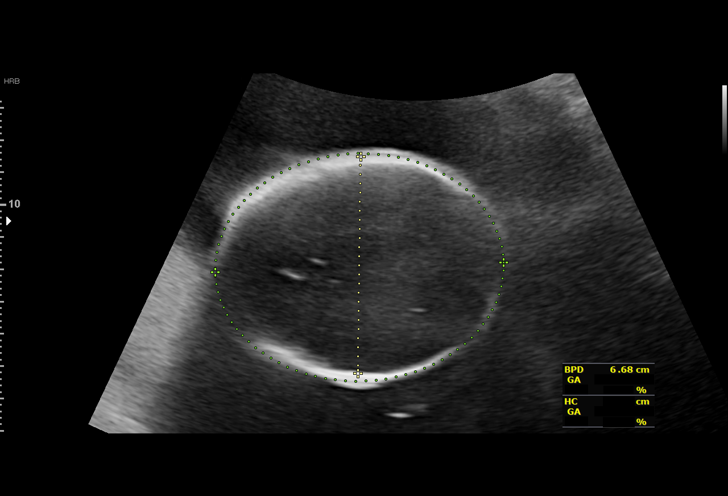
[im 28/50]
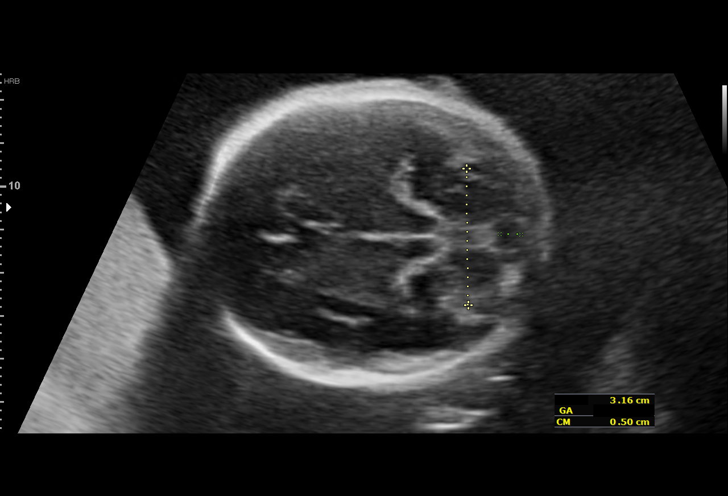
[im 31/50]
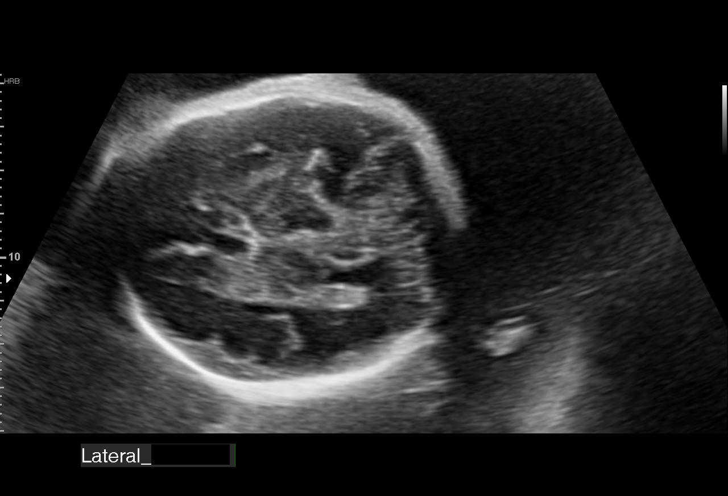
[im 35/50]
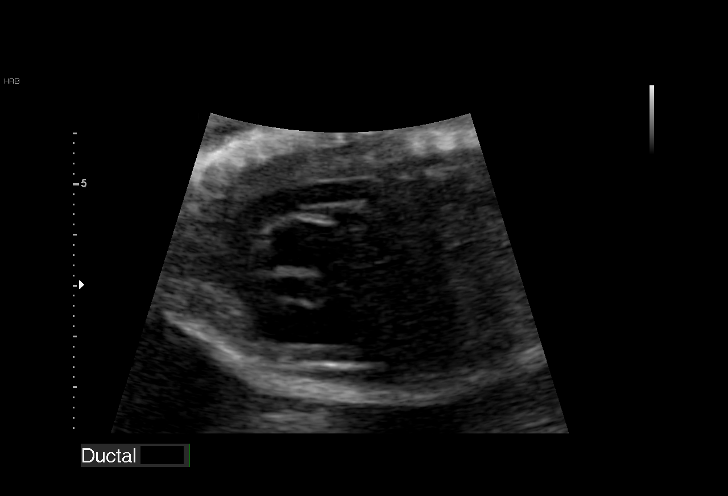
[im 39/50]
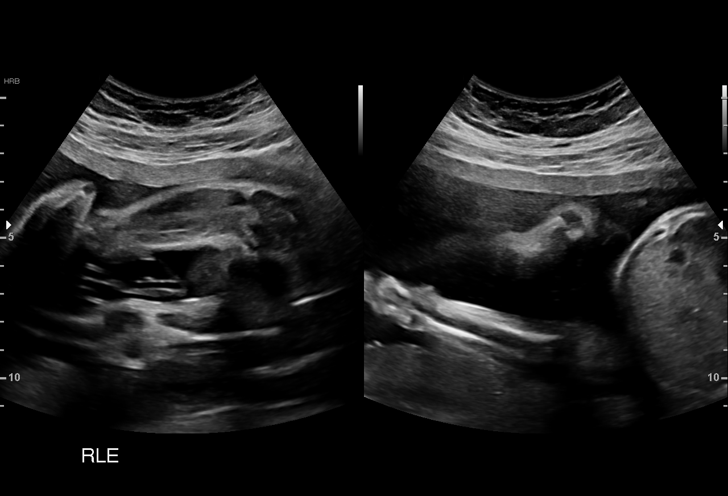
[im 42/50]
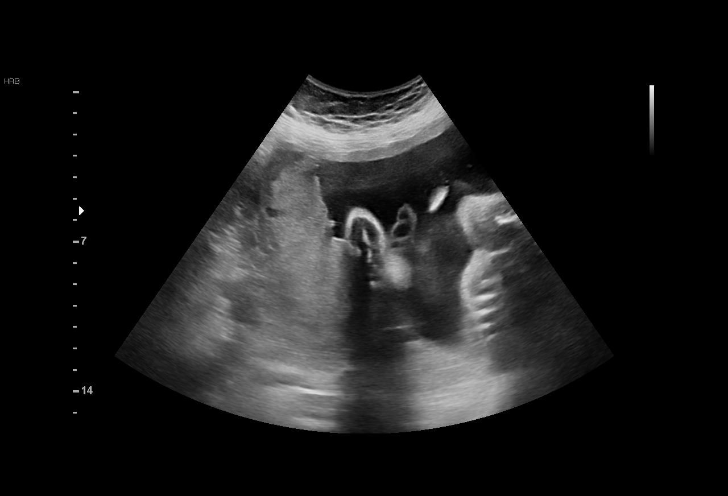
[im 46/50]
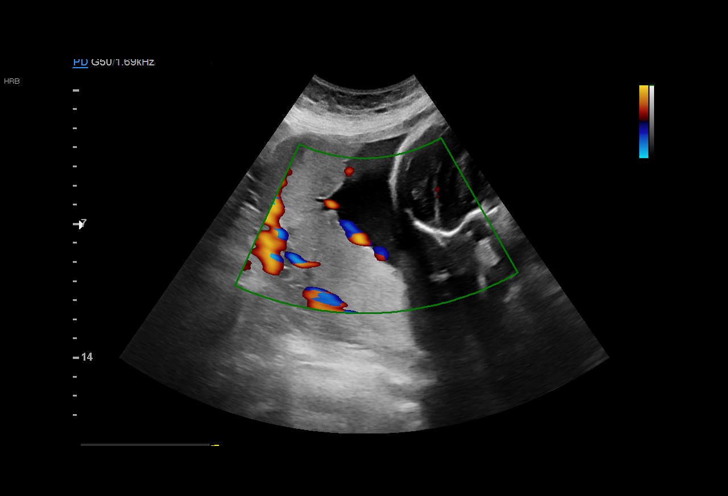
[im 50/50]
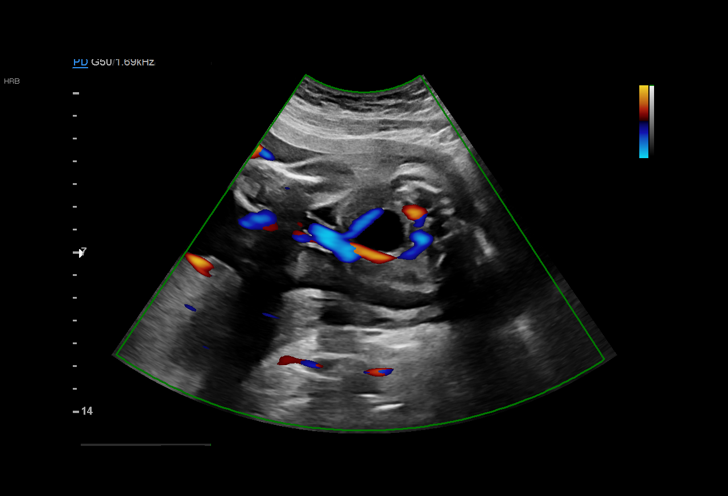

[14 of 28 positions shown; findings below may reference images not displayed]

Indications

27 weeks gestation of pregnancy
Encounter for antenatal screening for
malformations
Poor obstetric history: Previous midtrimester
loss (twin demise @ 05w5d)
Diabetes - Pregestational, 2nd trimester
Fetal Evaluation

Num Of Fetuses:         1
Fetal Heart Rate(bpm):  164
Cardiac Activity:       Observed
Presentation:           Frank breech
Placenta:               Posterior
P. Cord Insertion:      Visualized, central

Amniotic Fluid
AFI FV:      Polyhydramnios

Largest Pocket(cm)
12.63
Biometry

BPD:      66.6  mm     G. Age:  26w 6d         17  %    CI:        71.55   %    70 - 86
FL/HC:      20.4   %    18.8 -
HC:      250.7  mm     G. Age:  27w 2d         14  %    HC/AC:      1.11        1.05 -
AC:      225.8  mm     G. Age:  27w 0d         25  %    FL/BPD:     76.9   %    71 - 87
FL:       51.2  mm     G. Age:  27w 3d         30  %    FL/AC:      22.7   %    20 - 24
HUM:      46.7  mm     G. Age:  27w 4d         45  %
CER:      31.6  mm     G. Age:  27w 4d         50  %

LV:        4.1  mm
CM:          5  mm
Est. FW:    6885  gm      2 lb 4 oz     42  %
OB History

Gravidity:    3         Term:   1         SAB:   1
Living:       1
Gestational Age

LMP:           27w 4d        Date:  03/11/17                 EDD:   12/16/17
U/S Today:     27w 1d                                        EDD:   12/19/17
Best:          27w 4d     Det. By:  LMP  (03/11/17)          EDD:   12/16/17
Anatomy

Cranium:               Appears normal         Aortic Arch:            Appears normal
Cavum:                 Appears normal         Ductal Arch:            Appears normal
Ventricles:            Not well visualized    Diaphragm:              Appears normal
Choroid Plexus:        Appears normal         Stomach:                Appears normal, left
sided
Cerebellum:            Appears normal         Abdomen:                Appears normal
Posterior Fossa:       Appears normal         Abdominal Wall:         Not well visualized
Nuchal Fold:           Not applicable (>20    Cord Vessels:           Appears normal (3
wks GA)                                        vessel cord)
Face:                  Orbits nl; profile not Kidneys:                Appear normal
well visualized
Lips:                  Appears normal         Bladder:                Appears normal
Thoracic:              Appears normal         Spine:                  Ltd views no
intracranial signs of
NT
Heart:                 Not well visualized    Upper Extremities:      Appears normal
RVOT:                  Not well visualized    Lower Extremities:      Appears normal
LVOT:                  Not well visualized

Other:  Female gender Technically difficult due to advanced GA and fetal
position.
Cervix Uterus Adnexa

Cervix
Not visualized (advanced GA >38wks)
Impression

Patient with pregestational diabetes is admitted with c/o
decreased fetal movements and cough. She has poorly-
controlled diabetes.
Fetal echocardiography performed by pediatric cardiologist
was normal.

On ultrasound, the fetal growth is appropriate for the
gestational age. Polyhydramnios is seen. (deepest vertical
pocket=12 cm). Good fetal activity is present. Fetal anatomy
appears normal, but limited because of advanced gestational
age.

## 2019-04-13 ENCOUNTER — Emergency Department (HOSPITAL_COMMUNITY)
Admission: EM | Admit: 2019-04-13 | Discharge: 2019-04-13 | Disposition: A | Discharge status: Home / Self Care | Payer: BLUE CROSS/BLUE SHIELD | Attending: Emergency Medicine | Admitting: Emergency Medicine

## 2019-04-13 ENCOUNTER — Emergency Department (HOSPITAL_COMMUNITY): Payer: BLUE CROSS/BLUE SHIELD

## 2019-04-13 ENCOUNTER — Other Ambulatory Visit: Payer: Self-pay

## 2019-04-13 DIAGNOSIS — R4182 Altered mental status, unspecified: Secondary | ICD-10-CM | POA: Insufficient documentation

## 2019-04-13 DIAGNOSIS — R531 Weakness: Secondary | ICD-10-CM | POA: Insufficient documentation

## 2019-04-13 DIAGNOSIS — H538 Other visual disturbances: Secondary | ICD-10-CM | POA: Insufficient documentation

## 2019-04-13 DIAGNOSIS — Z7984 Long term (current) use of oral hypoglycemic drugs: Secondary | ICD-10-CM | POA: Insufficient documentation

## 2019-04-13 DIAGNOSIS — Z79899 Other long term (current) drug therapy: Secondary | ICD-10-CM | POA: Insufficient documentation

## 2019-04-13 DIAGNOSIS — E119 Type 2 diabetes mellitus without complications: Secondary | ICD-10-CM | POA: Insufficient documentation

## 2019-04-13 DIAGNOSIS — H539 Unspecified visual disturbance: Secondary | ICD-10-CM

## 2019-04-13 LAB — COMPREHENSIVE METABOLIC PANEL
ALT: 20 U/L (ref 0–44)
AST: 15 U/L (ref 15–41)
Albumin: 3.2 g/dL — ABNORMAL LOW (ref 3.5–5.0)
Alkaline Phosphatase: 91 U/L (ref 38–126)
Anion gap: 7 (ref 5–15)
BUN: 7 mg/dL (ref 6–20)
CO2: 26 mmol/L (ref 22–32)
Calcium: 9.2 mg/dL (ref 8.9–10.3)
Chloride: 103 mmol/L (ref 98–111)
Creatinine, Ser: 0.62 mg/dL (ref 0.44–1.00)
GFR calc Af Amer: >60 mL/min (ref >60)
GFR calc non Af Amer: >60 mL/min (ref >60)
Glucose, Bld: 411 mg/dL — ABNORMAL HIGH (ref 70–99)
Potassium: 4.1 mmol/L (ref 3.5–5.1)
Sodium: 136 mmol/L (ref 135–145)
Total Bilirubin: 0.5 mg/dL (ref 0.3–1.2)
Total Protein: 6.9 g/dL (ref 6.5–8.1)

## 2019-04-13 LAB — CBC
HCT: 39 % (ref 36.0–46.0)
Hemoglobin: 12 g/dL (ref 12.0–15.0)
MCH: 26.1 pg (ref 26.0–34.0)
MCHC: 30.8 g/dL (ref 30.0–36.0)
MCV: 85 fL (ref 80.0–100.0)
Platelets: 407 10*3/uL — ABNORMAL HIGH (ref 150–400)
RBC: 4.59 MIL/uL (ref 3.87–5.11)
RDW: 14.6 % (ref 11.5–15.5)
WBC: 6.6 10*3/uL (ref 4.0–10.5)
nRBC: 0 % (ref 0.0–0.2)

## 2019-04-13 LAB — RAPID URINE DRUG SCREEN, HOSP PERFORMED
Amphetamines: NOT DETECTED
Barbiturates: NOT DETECTED
Benzodiazepines: NOT DETECTED
Cocaine: NOT DETECTED
Opiates: NOT DETECTED
Tetrahydrocannabinol: NOT DETECTED

## 2019-04-13 LAB — CBG MONITORING, ED
Glucose-Capillary: 233 mg/dL — ABNORMAL HIGH (ref 70–99)
Glucose-Capillary: 314 mg/dL — ABNORMAL HIGH (ref 70–99)
Glucose-Capillary: 399 mg/dL — ABNORMAL HIGH (ref 70–99)

## 2019-04-13 LAB — I-STAT BETA HCG BLOOD, ED (MC, WL, AP ONLY): I-stat hCG, quantitative: <5 m[IU]/mL (ref <5)

## 2019-04-13 MED ORDER — SODIUM CHLORIDE 0.9% FLUSH: 3.0000 mL | Freq: Once | INTRAVENOUS | Status: DC

## 2019-04-13 MED ORDER — INSULIN ASPART 100 UNIT/ML ~~LOC~~ SOLN
5.0000 [IU] | Freq: Once | SUBCUTANEOUS | Status: AC
  Administered 2019-04-13: 05:00:00 5 [IU] via INTRAVENOUS

## 2019-04-13 MED ORDER — SODIUM CHLORIDE 0.9 % IV BOLUS
1000.0000 mL | Freq: Once | INTRAVENOUS | Status: AC
  Administered 2019-04-13: 1000 mL via INTRAVENOUS

## 2019-04-13 MED ORDER — LORAZEPAM 2 MG/ML IJ SOLN
1.0000 mg | Freq: Once | INTRAMUSCULAR | Status: DC
  Filled 2019-04-13: qty 1

## 2019-04-13 NOTE — ED Provider Notes (Signed)
[place] EMERGENCY DEPARTMENT Provider Note   CSN: [id number] Arrival date & time: 04/13/19  [id number]     History Chief Complaint  Patient presents with  . Altered Mental Status    Julia Willis is a 29 y.o. female.  Patient is a 29 year old female with past medical history of diabetes, preeclampsia.  She presents today for evaluation of weakness.  Over the past month, the patient has experienced episodes of weakness, blurry vision, confusion, and slurred speech.  She was seen at an outside facility 2 weeks ago for a similar episode, however no cause was found.  Patient also describes intermittent vaginal bleeding.  She has an implanted contraceptive device in her arm and feels as though this needs to be removed.  The history is provided by the patient.  Altered Mental Status Presenting symptoms: behavior changes and disorientation   Severity:  Moderate Episode history:  Multiple Timing:  Intermittent Progression:  Worsening      Past Medical History:  Diagnosis Date  . Acid reflux   . Diabetes mellitus    metformin  . Gonorrhea June 2013  . History of pre-eclampsia in prior pregnancy, currently pregnant   . Ovarian cyst   . Pre-eclampsia   . Thrombocytosis [facility])     Patient Active Problem List   Diagnosis Date Noted  . Normal intrauterine pregnancy in third trimester 12/02/2017  . Anemia 11/23/2017  . Gestational diabetes requiring insulin 09/20/2017  . History of cesarean section 05-26-2017  . History of pre-eclampsia May 26, 2017  . History of neonatal death 05-26-17  . Diabetes mellitus affecting pregnancy in third trimester 02/05/2017  . DM (diabetes mellitus), type 2 ([place]) 08/26/2016  . UTI (urinary tract infection) in pregnancy, antepartum, second trimester 08/26/2016  . Thrombocytosis ([place]) 08/26/2016  . Pregnancy 07/17/2016  . High-risk pregnancy supervision 05/13/2012    Past Surgical History:  Procedure Laterality Date  . CESAREAN  SECTION N/A 02/05/2017   Procedure: CESAREAN SECTION;  Surgeon: Thurnell Lose, MD;  Location: [place];  Service: Obstetrics;  Laterality: N/A;  . CESAREAN SECTION N/A 12/02/2017   Procedure: Repeat CESAREAN SECTION with Scar Revision;  Surgeon: Janyth Pupa, DO;  Location: [place];  Service: Obstetrics;  Laterality: N/A;  Heather, RNFA  . NO PAST SURGERIES       OB History    Gravida  3   Para  3   Term  2   Preterm      AB  0   Living  1     SAB  0   TAB      Ectopic      Multiple  1   Live Births  2           Family History  Problem Relation Age of Onset  . Diabetes Mother   . Hypertension Father   . Heart disease Father   . Diabetes Maternal Aunt   . Hypertension Maternal Aunt   . Diabetes Maternal Uncle   . Hypertension Maternal Uncle   . Cancer Maternal Uncle   . Hypertension Paternal Aunt   . Hypertension Paternal Uncle     Social History   Tobacco Use  . Smoking status: Never Smoker  . Smokeless tobacco: Never Used  Substance Use Topics  . Alcohol use: No  . Drug use: No    Home Medications Prior to Admission medications   Medication Sig Start Date End Date Taking? Authorizing Provider  cephALEXin (KEFLEX) 500 MG capsule 2  caps po bid x 7 days 05/11/18   Margarita Mail, PA-C  ibuprofen (ADVIL,MOTRIN) 600 MG tablet Take 1 tablet (600 mg total) by mouth every 6 (six) hours as needed. 12/05/17   Everett Graff, MD  metFORMIN (GLUCOPHAGE-XR) 500 MG 24 hr tablet Take 1 tablet (500 mg total) by mouth daily. 12/06/17   Everett Graff, MD  oxyCODONE (OXY IR/ROXICODONE) 5 MG immediate release tablet Take 1 tablet (5 mg total) by mouth every 4 (four) hours as needed (pain scale 4-7). 12/05/17   Everett Graff, MD    Allergies    Patient has no known allergies.  Review of Systems   Review of Systems  All other systems reviewed and are negative.   Physical Exam Updated Vital Signs BP 118/90 (BP Location: Right Arm)    Pulse 75   Temp 98.1 F (36.7 C) (Oral)   Resp 15   SpO2 99%   Physical Exam Vitals and nursing note reviewed.  Constitutional:      General: She is not in acute distress.    Appearance: She is well-developed. She is not diaphoretic.     Comments: Patient is somnolent, but easily arousable.  HENT:     Head: Normocephalic and atraumatic.  Eyes:     Extraocular Movements: Extraocular movements intact.     Pupils: Pupils are equal, round, and reactive to light.  Cardiovascular:     Rate and Rhythm: Normal rate and regular rhythm.     Heart sounds: No murmur. No friction rub. No gallop.   Pulmonary:     Effort: Pulmonary effort is normal. No respiratory distress.     Breath sounds: Normal breath sounds. No wheezing.  Abdominal:     General: Bowel sounds are normal. There is no distension.     Palpations: Abdomen is soft.     Tenderness: There is no abdominal tenderness.  Musculoskeletal:        General: Normal range of motion.     Cervical back: Normal range of motion and neck supple.  Skin:    General: Skin is warm and dry.  Neurological:     Mental Status: She is oriented to person, place, and time.     ED Results / Procedures / Treatments   Labs (all labs ordered are listed, but only abnormal results are displayed) Labs Reviewed  COMPREHENSIVE METABOLIC PANEL - Abnormal; Notable for the following components:      Result Value   Glucose, Bld 411 (*)    Albumin 3.2 (*)    All other components within normal limits  CBC - Abnormal; Notable for the following components:   Platelets 407 (*)    All other components within normal limits  CBG MONITORING, ED - Abnormal; Notable for the following components:   Glucose-Capillary 399 (*)    All other components within normal limits  RAPID URINE DRUG SCREEN, HOSP PERFORMED  CBG MONITORING, ED  I-STAT BETA HCG BLOOD, ED ([facility], [facility], [facility] ONLY)  CBG MONITORING, ED    EKG None  Radiology No results  found.  Procedures Procedures (including critical care time)  Medications Ordered in ED Medications  sodium chloride flush (NS) 0.9 % injection 3 mL (has no administration in time range)    ED Course  I have reviewed the triage vital signs and the nursing notes.  Pertinent labs & imaging results that were available during my care of the patient were reviewed by me and considered in my medical decision making (see chart for details).  MDM Rules/Calculators/A&P  Patient is a 29 year old female presenting with complaints of generalized weakness, blurry vision, and slurred speech that began earlier this evening.  Patient has had 2 similar episodes in the past month and has been seen at an outside facility.  No definitive cause has been provided.  Vital signs are stable and physical examination is basically unremarkable.  Patient is somewhat sluggish and slow to respond, but is appropriate and appears neurologically intact.  Patient's work-up includes blood counts and electrolytes, all of which were unremarkable with the exception of an elevated glucose.  She was given normal saline and insulin with good results.  She reports vaginal bleeding that has been occurring intermittently, but there is no evidence for anemia.  Pregnancy test is negative.  Patient did undergo an MRI of the brain to rule out other pathology such as stroke or MS.  This was unremarkable.  At this point, I see no obvious cause of her symptoms.  I feel as though I have excluded emergent pathology and will have the patient follow-up with neurology if she is not improving or these episodes persist.  Final Clinical Impression(s) / ED Diagnoses Final diagnoses:  None    Rx / DC Orders ED Discharge Orders    None       Veryl Speak, MD 04/13/19 [phone]

## 2019-04-13 NOTE — ED Triage Notes (Signed)
Per pt's husband she woke up and has been very altered and saying she has blurred vision. Pt's husband saying her sugars have been spiking and not sure why. Pt has no headaches just acting off. Pt said she has been bleeding heavy due to her implant and needs to be replaced.

## 2019-04-13 NOTE — Discharge Instructions (Signed)
Continue medications as before.  You should follow-up with neurology.  A referral has been made to [facility] neurology and they should be in touch with you in the near future.  Return to the ER if symptoms significantly worsen or change.

## 2019-09-23 DIAGNOSIS — L03011 Cellulitis of right finger: Secondary | ICD-10-CM | POA: Diagnosis not present

## 2019-09-23 DIAGNOSIS — E119 Type 2 diabetes mellitus without complications: Secondary | ICD-10-CM | POA: Diagnosis not present

## 2019-10-29 DIAGNOSIS — B351 Tinea unguium: Secondary | ICD-10-CM | POA: Diagnosis not present

## 2019-10-29 DIAGNOSIS — L03011 Cellulitis of right finger: Secondary | ICD-10-CM | POA: Diagnosis not present

## 2019-10-29 DIAGNOSIS — E1165 Type 2 diabetes mellitus with hyperglycemia: Secondary | ICD-10-CM | POA: Diagnosis not present

## 2019-11-14 DIAGNOSIS — L603 Nail dystrophy: Secondary | ICD-10-CM | POA: Diagnosis not present

## 2019-11-14 DIAGNOSIS — B372 Candidiasis of skin and nail: Secondary | ICD-10-CM | POA: Diagnosis not present

## 2019-12-12 ENCOUNTER — Emergency Department (HOSPITAL_COMMUNITY)
Admission: EM | Admit: 2019-12-12 | Discharge: 2019-12-12 | Disposition: A | Discharge status: Home / Self Care | Payer: BLUE CROSS/BLUE SHIELD | Attending: Emergency Medicine | Admitting: Emergency Medicine

## 2019-12-12 ENCOUNTER — Other Ambulatory Visit: Payer: Self-pay

## 2019-12-12 DIAGNOSIS — J029 Acute pharyngitis, unspecified: Secondary | ICD-10-CM | POA: Diagnosis not present

## 2019-12-12 DIAGNOSIS — R519 Headache, unspecified: Secondary | ICD-10-CM | POA: Insufficient documentation

## 2019-12-12 DIAGNOSIS — R059 Cough, unspecified: Secondary | ICD-10-CM | POA: Diagnosis not present

## 2019-12-12 DIAGNOSIS — M791 Myalgia, unspecified site: Secondary | ICD-10-CM | POA: Insufficient documentation

## 2019-12-12 DIAGNOSIS — E119 Type 2 diabetes mellitus without complications: Secondary | ICD-10-CM | POA: Diagnosis not present

## 2019-12-12 DIAGNOSIS — J069 Acute upper respiratory infection, unspecified: Secondary | ICD-10-CM | POA: Insufficient documentation

## 2019-12-12 DIAGNOSIS — B9789 Other viral agents as the cause of diseases classified elsewhere: Secondary | ICD-10-CM | POA: Diagnosis not present

## 2019-12-12 DIAGNOSIS — R0981 Nasal congestion: Secondary | ICD-10-CM | POA: Diagnosis not present

## 2019-12-12 DIAGNOSIS — Z20822 Contact with and (suspected) exposure to covid-19: Secondary | ICD-10-CM | POA: Diagnosis not present

## 2019-12-12 DIAGNOSIS — Z7984 Long term (current) use of oral hypoglycemic drugs: Secondary | ICD-10-CM | POA: Diagnosis not present

## 2019-12-12 MED ORDER — CETIRIZINE-PSEUDOEPHEDRINE ER 5-120 MG PO TB12: 1.0000 | ORAL_TABLET | Freq: Every day | ORAL | 0 refills | Status: AC

## 2019-12-12 MED ORDER — BENZONATATE 200 MG PO CAPS: 200.0000 mg | ORAL_CAPSULE | Freq: Three times a day (TID) | ORAL | 0 refills | Status: AC

## 2019-12-12 MED ORDER — FLUTICASONE PROPIONATE 50 MCG/ACT NA SUSP: 1.0000 | Freq: Every day | NASAL | 2 refills | Status: AC

## 2019-12-12 MED ORDER — ACETAMINOPHEN 325 MG PO TABS
650.0000 mg | ORAL_TABLET | Freq: Once | ORAL | Status: AC
  Administered 2019-12-12: 650 mg via ORAL
  Filled 2019-12-12: qty 2

## 2019-12-12 NOTE — Discharge Instructions (Signed)
Recheck with your doctor if not improving after 10 days. Return to the ER for worsening or concerning symptoms. Take Tessalon as needed as prescribed for cough. Take Zyrtec D as prescribed for congestion. Take Flonase daily.

## 2019-12-12 NOTE — ED Notes (Signed)
Mickel Baas, ED PA made aware of pt temperature and pulse rate. New order for tylenol obtained.

## 2019-12-12 NOTE — ED Notes (Signed)
Patient Alert and oriented to baseline. Stable and ambulatory to baseline. Patient verbalized understanding of the discharge instructions.  Patient belongings were taken by the patient.   

## 2019-12-12 NOTE — ED Triage Notes (Signed)
Pt arrived POV with cc of body aches, chills, cough, sore throat. Went to [place] ER this am for same complaint, tested negative for covid, left due to wait time.

## 2019-12-12 NOTE — ED Provider Notes (Addendum)
[facility] EMERGENCY DEPARTMENT Provider Note   CSN: [id number] Arrival date & time: 12/12/19  1016     History Chief Complaint  Patient presents with  . Generalized Body Aches    Julia Willis is a 29 y.o. female.  29 year old female with complaint of cough, congestion, body aches, sore throat, headache, chills without fever. Symptoms started 5 days ago, minimal improvement with OTC medications. Patient is an insulin dependent diabetic, no changes in CBG.  Patient is a non smoker, no history of asthma or chronic lung disease. Has not been vaccinated due to fears of the vaccine.   Julia Willis was evaluated in Emergency Department on 12/12/2019 for the symptoms described in the history of present illness. She was evaluated in the context of the global COVID-19 pandemic, which necessitated consideration that the patient might be at risk for infection with the SARS-CoV-2 virus that causes COVID-19. Institutional protocols and algorithms that pertain to the evaluation of patients at risk for COVID-19 are in a state of rapid change based on information released by regulatory bodies including the CDC and federal and state organizations. These policies and algorithms were followed during the patient's care in the ED.Julia Willis        Past Medical History:  Diagnosis Date  . Acid reflux   . Diabetes mellitus    metformin  . Gonorrhea June 2013  . History of pre-eclampsia in prior pregnancy, currently pregnant   . Ovarian cyst   . Pre-eclampsia   . Thrombocytosis [facility])     Patient Active Problem List   Diagnosis Date Noted  . Normal intrauterine pregnancy in third trimester 12/02/2017  . Anemia 11/23/2017  . Gestational diabetes requiring insulin 09/20/2017  . History of cesarean section 05-17-2017  . History of pre-eclampsia May 17, 2017  . History of neonatal death 05/17/17  . Diabetes mellitus affecting pregnancy in third trimester 02/05/2017  . DM (diabetes  mellitus), type 2 ([place]) 08/26/2016  . UTI (urinary tract infection) in pregnancy, antepartum, second trimester 08/26/2016  . Thrombocytosis 08/26/2016  . Pregnancy 07/17/2016  . High-risk pregnancy supervision 05/13/2012    Past Surgical History:  Procedure Laterality Date  . CESAREAN SECTION N/A 02/05/2017   Procedure: CESAREAN SECTION;  Surgeon: Thurnell Lose, MD;  Location: [place];  Service: Obstetrics;  Laterality: N/A;  . CESAREAN SECTION N/A 12/02/2017   Procedure: Repeat CESAREAN SECTION with Scar Revision;  Surgeon: Janyth Pupa, DO;  Location: [place];  Service: Obstetrics;  Laterality: N/A;  Heather, RNFA  . NO PAST SURGERIES       OB History    Gravida  3   Para  3   Term  2   Preterm      AB  0   Living  1     SAB  0   TAB      Ectopic      Multiple  1   Live Births  2           Family History  Problem Relation Age of Onset  . Diabetes Mother   . Hypertension Father   . Heart disease Father   . Diabetes Maternal Aunt   . Hypertension Maternal Aunt   . Diabetes Maternal Uncle   . Hypertension Maternal Uncle   . Cancer Maternal Uncle   . Hypertension Paternal Aunt   . Hypertension Paternal Uncle     Social History   Tobacco Use  . Smoking status: Never Smoker  .  Smokeless tobacco: Never Used  Vaping Use  . Vaping Use: Never used  Substance Use Topics  . Alcohol use: No  . Drug use: No    Home Medications Prior to Admission medications   Medication Sig Start Date End Date Taking? Authorizing Provider  benzonatate (TESSALON) 200 MG capsule Take 1 capsule (200 mg total) by mouth every 8 (eight) hours for 10 days. 12/12/19 12/22/19  Tacy Learn, PA-C  cetirizine-pseudoephedrine (ZYRTEC-D) 5-120 MG tablet Take 1 tablet by mouth daily. 12/12/19   Tacy Learn, PA-C  fluticasone (FLONASE) 50 MCG/ACT nasal spray Place 1 spray into both nostrils daily. 12/12/19   Tacy Learn, PA-C  ibuprofen (ADVIL,MOTRIN)  600 MG tablet Take 1 tablet (600 mg total) by mouth every 6 (six) hours as needed. 12/05/17   Everett Graff, MD  metFORMIN (GLUCOPHAGE-XR) 500 MG 24 hr tablet Take 1 tablet (500 mg total) by mouth daily. 12/06/17   Everett Graff, MD  oxyCODONE (OXY IR/ROXICODONE) 5 MG immediate release tablet Take 1 tablet (5 mg total) by mouth every 4 (four) hours as needed (pain scale 4-7). 12/05/17   Everett Graff, MD    Allergies    Patient has no known allergies.  Review of Systems   Review of Systems  Constitutional: Positive for chills. Negative for fever.  HENT: Positive for congestion, rhinorrhea and sore throat. Negative for ear discharge and ear pain.   Respiratory: Positive for cough. Negative for shortness of breath.   Cardiovascular: Negative for chest pain.  Gastrointestinal: Negative for nausea and vomiting.  Musculoskeletal: Positive for arthralgias and myalgias.  Skin: Negative for rash and wound.  Allergic/Immunologic: Positive for immunocompromised state.  Neurological: Positive for headaches. Negative for weakness.  Hematological: Negative for adenopathy.  Psychiatric/Behavioral: Negative for confusion.  All other systems reviewed and are negative.   Physical Exam Updated Vital Signs BP 134/90   Pulse (!) 118   Temp (!) 102.1 F (38.9 C) (Oral)   Resp 19   SpO2 95%   Physical Exam Vitals and nursing note reviewed.  Constitutional:      General: She is not in acute distress.    Appearance: She is well-developed. She is not diaphoretic.  HENT:     Head: Normocephalic and atraumatic.     Nose: Congestion present.     Mouth/Throat:     Mouth: Mucous membranes are moist.     Pharynx: No oropharyngeal exudate or posterior oropharyngeal erythema.  Eyes:     Conjunctiva/sclera: Conjunctivae normal.  Cardiovascular:     Rate and Rhythm: Normal rate and regular rhythm.     Pulses: Normal pulses.     Heart sounds: Normal heart sounds.  Pulmonary:     Effort:  Pulmonary effort is normal.     Breath sounds: Normal breath sounds.  Musculoskeletal:     Cervical back: Neck supple.  Lymphadenopathy:     Cervical: No cervical adenopathy.  Skin:    General: Skin is warm and dry.     Findings: No erythema or rash.  Neurological:     Mental Status: She is alert and oriented to person, place, and time.  Psychiatric:        Behavior: Behavior normal.     ED Results / Procedures / Treatments   Labs (all labs ordered are listed, but only abnormal results are displayed) Labs Reviewed - No data to display  EKG None  Radiology No results found.  Procedures Procedures (including critical care time)  Medications Ordered  in ED Medications  acetaminophen (TYLENOL) tablet 650 mg (has no administration in time range)    ED Course  I have reviewed the triage vital signs and the nursing notes.  Pertinent labs & imaging results that were available during my care of the patient were reviewed by me and considered in my medical decision making (see chart for details).  Clinical Course as of Dec 12 1246  Fri Dec 12, 2019  1234 29 yo female with URI symptoms x 5 days, seen at [facility] yesterday with negative COVID/flu test but wait was too long and left without being seen. Exam with nasal congestion and post nasal drip, otherwise lungs clear and patient will appearing. Discussed viral illness, given tessalon, zyrtec d, flonase. Recheck with PCP if symptoms not improving after 10 days.    [LM]  1155 At discharge, found to have a temp of 102.1. Patient was given Tylenol. Offered repeat COVID/flu test (negative last night at [facility]), patient declined.    [LM]    Clinical Course User Index [LM] Roque Lias   MDM Rules/Calculators/A&P                          Final Clinical Impression(s) / ED Diagnoses Final diagnoses:  Viral upper respiratory tract infection    Rx / DC Orders ED Discharge Orders         Ordered    benzonatate (TESSALON) 200  MG capsule  Every 8 hours        12/12/19 1233    fluticasone (FLONASE) 50 MCG/ACT nasal spray  Daily        12/12/19 1233    cetirizine-pseudoephedrine (ZYRTEC-D) 5-120 MG tablet  Daily        12/12/19 1233           Tacy Learn, PA-C 12/12/19 1236    Tacy Learn, PA-C 12/12/19 1248    Carmin Muskrat, MD 12/12/19 1626

## 2019-12-22 DIAGNOSIS — Z3A00 Weeks of gestation of pregnancy not specified: Secondary | ICD-10-CM | POA: Diagnosis not present

## 2019-12-22 DIAGNOSIS — O24913 Unspecified diabetes mellitus in pregnancy, third trimester: Secondary | ICD-10-CM | POA: Diagnosis not present

## 2020-01-28 DIAGNOSIS — Z20822 Contact with and (suspected) exposure to covid-19: Secondary | ICD-10-CM | POA: Diagnosis not present

## 2020-03-22 DIAGNOSIS — E1165 Type 2 diabetes mellitus with hyperglycemia: Secondary | ICD-10-CM | POA: Diagnosis not present

## 2020-03-22 DIAGNOSIS — N644 Mastodynia: Secondary | ICD-10-CM | POA: Diagnosis not present

## 2020-03-22 DIAGNOSIS — R110 Nausea: Secondary | ICD-10-CM | POA: Diagnosis not present

## 2020-03-22 DIAGNOSIS — N3000 Acute cystitis without hematuria: Secondary | ICD-10-CM | POA: Diagnosis not present

## 2020-03-22 DIAGNOSIS — Z7984 Long term (current) use of oral hypoglycemic drugs: Secondary | ICD-10-CM | POA: Diagnosis not present

## 2020-03-31 DIAGNOSIS — Z09 Encounter for follow-up examination after completed treatment for conditions other than malignant neoplasm: Secondary | ICD-10-CM | POA: Diagnosis not present

## 2020-03-31 DIAGNOSIS — E119 Type 2 diabetes mellitus without complications: Secondary | ICD-10-CM | POA: Diagnosis not present

## 2020-04-02 DIAGNOSIS — W01198A Fall on same level from slipping, tripping and stumbling with subsequent striking against other object, initial encounter: Secondary | ICD-10-CM | POA: Diagnosis not present

## 2020-04-02 DIAGNOSIS — E1165 Type 2 diabetes mellitus with hyperglycemia: Secondary | ICD-10-CM | POA: Diagnosis not present

## 2020-04-02 DIAGNOSIS — S0990XA Unspecified injury of head, initial encounter: Secondary | ICD-10-CM | POA: Diagnosis not present

## 2020-04-02 DIAGNOSIS — G8911 Acute pain due to trauma: Secondary | ICD-10-CM | POA: Diagnosis not present

## 2020-04-02 DIAGNOSIS — R519 Headache, unspecified: Secondary | ICD-10-CM | POA: Diagnosis not present

## 2020-04-02 DIAGNOSIS — R55 Syncope and collapse: Secondary | ICD-10-CM | POA: Diagnosis not present

## 2020-04-02 DIAGNOSIS — Z7984 Long term (current) use of oral hypoglycemic drugs: Secondary | ICD-10-CM | POA: Diagnosis not present

## 2020-04-02 DIAGNOSIS — Y9301 Activity, walking, marching and hiking: Secondary | ICD-10-CM | POA: Diagnosis not present

## 2020-04-02 DIAGNOSIS — M25572 Pain in left ankle and joints of left foot: Secondary | ICD-10-CM | POA: Diagnosis not present

## 2020-04-02 DIAGNOSIS — S069X9A Unspecified intracranial injury with loss of consciousness of unspecified duration, initial encounter: Secondary | ICD-10-CM | POA: Diagnosis not present

## 2020-04-02 DIAGNOSIS — Z794 Long term (current) use of insulin: Secondary | ICD-10-CM | POA: Diagnosis not present

## 2020-04-13 DIAGNOSIS — Z09 Encounter for follow-up examination after completed treatment for conditions other than malignant neoplasm: Secondary | ICD-10-CM | POA: Diagnosis not present

## 2020-04-13 DIAGNOSIS — E119 Type 2 diabetes mellitus without complications: Secondary | ICD-10-CM | POA: Diagnosis not present

## 2020-04-13 DIAGNOSIS — W19XXXD Unspecified fall, subsequent encounter: Secondary | ICD-10-CM | POA: Diagnosis not present

## 2020-06-28 DIAGNOSIS — O24913 Unspecified diabetes mellitus in pregnancy, third trimester: Secondary | ICD-10-CM | POA: Diagnosis not present

## 2020-07-02 DIAGNOSIS — R102 Pelvic and perineal pain: Secondary | ICD-10-CM | POA: Diagnosis not present

## 2020-07-02 DIAGNOSIS — N939 Abnormal uterine and vaginal bleeding, unspecified: Secondary | ICD-10-CM | POA: Diagnosis not present

## 2020-07-02 DIAGNOSIS — Z7984 Long term (current) use of oral hypoglycemic drugs: Secondary | ICD-10-CM | POA: Diagnosis not present

## 2020-07-02 DIAGNOSIS — N938 Other specified abnormal uterine and vaginal bleeding: Secondary | ICD-10-CM | POA: Diagnosis not present

## 2020-07-02 DIAGNOSIS — E119 Type 2 diabetes mellitus without complications: Secondary | ICD-10-CM | POA: Diagnosis not present

## 2020-07-02 DIAGNOSIS — Z794 Long term (current) use of insulin: Secondary | ICD-10-CM | POA: Diagnosis not present

## 2020-07-14 DIAGNOSIS — E119 Type 2 diabetes mellitus without complications: Secondary | ICD-10-CM | POA: Diagnosis not present

## 2020-07-14 DIAGNOSIS — E349 Endocrine disorder, unspecified: Secondary | ICD-10-CM | POA: Diagnosis not present

## 2020-08-21 DIAGNOSIS — E119 Type 2 diabetes mellitus without complications: Secondary | ICD-10-CM | POA: Diagnosis not present

## 2020-08-21 DIAGNOSIS — M545 Low back pain, unspecified: Secondary | ICD-10-CM | POA: Diagnosis not present

## 2020-08-21 DIAGNOSIS — S39012A Strain of muscle, fascia and tendon of lower back, initial encounter: Secondary | ICD-10-CM | POA: Diagnosis not present

## 2020-08-21 DIAGNOSIS — M25512 Pain in left shoulder: Secondary | ICD-10-CM | POA: Diagnosis not present

## 2020-08-21 DIAGNOSIS — W19XXXA Unspecified fall, initial encounter: Secondary | ICD-10-CM | POA: Diagnosis not present

## 2020-08-21 DIAGNOSIS — Z794 Long term (current) use of insulin: Secondary | ICD-10-CM | POA: Diagnosis not present

## 2020-08-21 DIAGNOSIS — G8911 Acute pain due to trauma: Secondary | ICD-10-CM | POA: Diagnosis not present

## 2020-11-18 DIAGNOSIS — Z7984 Long term (current) use of oral hypoglycemic drugs: Secondary | ICD-10-CM | POA: Diagnosis not present

## 2020-11-18 DIAGNOSIS — Z794 Long term (current) use of insulin: Secondary | ICD-10-CM | POA: Diagnosis not present

## 2020-11-18 DIAGNOSIS — R531 Weakness: Secondary | ICD-10-CM | POA: Diagnosis not present

## 2020-11-18 DIAGNOSIS — J111 Influenza due to unidentified influenza virus with other respiratory manifestations: Secondary | ICD-10-CM | POA: Diagnosis not present

## 2020-11-18 DIAGNOSIS — Z20822 Contact with and (suspected) exposure to covid-19: Secondary | ICD-10-CM | POA: Diagnosis not present

## 2020-11-18 DIAGNOSIS — J101 Influenza due to other identified influenza virus with other respiratory manifestations: Secondary | ICD-10-CM | POA: Diagnosis not present

## 2020-11-18 DIAGNOSIS — E119 Type 2 diabetes mellitus without complications: Secondary | ICD-10-CM | POA: Diagnosis not present

## 2020-11-18 DIAGNOSIS — R059 Cough, unspecified: Secondary | ICD-10-CM | POA: Diagnosis not present

## 2020-12-17 ENCOUNTER — Emergency Department (HOSPITAL_COMMUNITY)
Admission: EM | Admit: 2020-12-17 | Discharge: 2020-12-17 | Disposition: A | Discharge status: Home / Self Care | Payer: BLUE CROSS/BLUE SHIELD | Attending: Emergency Medicine | Admitting: Emergency Medicine

## 2020-12-17 ENCOUNTER — Other Ambulatory Visit: Payer: Self-pay

## 2020-12-17 ENCOUNTER — Encounter (HOSPITAL_COMMUNITY): Payer: Self-pay | Admitting: Emergency Medicine

## 2020-12-17 DIAGNOSIS — E119 Type 2 diabetes mellitus without complications: Secondary | ICD-10-CM | POA: Diagnosis not present

## 2020-12-17 DIAGNOSIS — Z79899 Other long term (current) drug therapy: Secondary | ICD-10-CM | POA: Insufficient documentation

## 2020-12-17 DIAGNOSIS — Z20822 Contact with and (suspected) exposure to covid-19: Secondary | ICD-10-CM | POA: Insufficient documentation

## 2020-12-17 DIAGNOSIS — F419 Anxiety disorder, unspecified: Secondary | ICD-10-CM | POA: Insufficient documentation

## 2020-12-17 DIAGNOSIS — F29 Unspecified psychosis not due to a substance or known physiological condition: Secondary | ICD-10-CM | POA: Diagnosis not present

## 2020-12-17 DIAGNOSIS — N9489 Other specified conditions associated with female genital organs and menstrual cycle: Secondary | ICD-10-CM | POA: Insufficient documentation

## 2020-12-17 DIAGNOSIS — R457 State of emotional shock and stress, unspecified: Secondary | ICD-10-CM | POA: Diagnosis not present

## 2020-12-17 DIAGNOSIS — R739 Hyperglycemia, unspecified: Secondary | ICD-10-CM | POA: Diagnosis not present

## 2020-12-17 DIAGNOSIS — I10 Essential (primary) hypertension: Secondary | ICD-10-CM | POA: Diagnosis not present

## 2020-12-17 DIAGNOSIS — Z7984 Long term (current) use of oral hypoglycemic drugs: Secondary | ICD-10-CM | POA: Insufficient documentation

## 2020-12-17 LAB — CBC WITH DIFFERENTIAL/PLATELET
Abs Immature Granulocytes: 0.02 10*3/uL (ref 0.00–0.07)
Basophils Absolute: 0 10*3/uL (ref 0.0–0.1)
Basophils Relative: 0 %
Eosinophils Absolute: 0.1 10*3/uL (ref 0.0–0.5)
Eosinophils Relative: 1 %
HCT: 43.8 % (ref 36.0–46.0)
Hemoglobin: 14.3 g/dL (ref 12.0–15.0)
Immature Granulocytes: 0 %
Lymphocytes Relative: 36 %
Lymphs Abs: 2.6 10*3/uL (ref 0.7–4.0)
MCH: 28 pg (ref 26.0–34.0)
MCHC: 32.6 g/dL (ref 30.0–36.0)
MCV: 85.7 fL (ref 80.0–100.0)
Monocytes Absolute: 0.4 10*3/uL (ref 0.1–1.0)
Monocytes Relative: 5 %
Neutro Abs: 4.1 10*3/uL (ref 1.7–7.7)
Neutrophils Relative %: 58 %
Platelets: 334 10*3/uL (ref 150–400)
RBC: 5.11 MIL/uL (ref 3.87–5.11)
RDW: 13 % (ref 11.5–15.5)
WBC: 7.1 10*3/uL (ref 4.0–10.5)
nRBC: 0 % (ref 0.0–0.2)

## 2020-12-17 LAB — COMPREHENSIVE METABOLIC PANEL
ALT: 21 U/L (ref 0–44)
AST: 18 U/L (ref 15–41)
Albumin: 3.6 g/dL (ref 3.5–5.0)
Alkaline Phosphatase: 95 U/L (ref 38–126)
Anion gap: 12 (ref 5–15)
BUN: 11 mg/dL (ref 6–20)
CO2: 22 mmol/L (ref 22–32)
Calcium: 9.1 mg/dL (ref 8.9–10.3)
Chloride: 98 mmol/L (ref 98–111)
Creatinine, Ser: 0.57 mg/dL (ref 0.44–1.00)
GFR, Estimated: >60 mL/min (ref >60)
Glucose, Bld: 315 mg/dL — ABNORMAL HIGH (ref 70–99)
Potassium: 3.9 mmol/L (ref 3.5–5.1)
Sodium: 132 mmol/L — ABNORMAL LOW (ref 135–145)
Total Bilirubin: 0.5 mg/dL (ref 0.3–1.2)
Total Protein: 7.7 g/dL (ref 6.5–8.1)

## 2020-12-17 LAB — SALICYLATE LEVEL: Salicylate Lvl: <7 mg/dL — ABNORMAL LOW (ref 7.0–30.0)

## 2020-12-17 LAB — ETHANOL: Alcohol, Ethyl (B): <10 mg/dL (ref <10)

## 2020-12-17 LAB — I-STAT BETA HCG BLOOD, ED (MC, WL, AP ONLY): I-stat hCG, quantitative: <5 m[IU]/mL (ref <5)

## 2020-12-17 LAB — RESP PANEL BY RT-PCR (FLU A&B, COVID) ARPGX2
Influenza A by PCR: NEGATIVE
Influenza B by PCR: NEGATIVE
SARS Coronavirus 2 by RT PCR: NEGATIVE

## 2020-12-17 LAB — RAPID URINE DRUG SCREEN, HOSP PERFORMED
Amphetamines: NOT DETECTED
Barbiturates: NOT DETECTED
Benzodiazepines: NOT DETECTED
Cocaine: NOT DETECTED
Opiates: NOT DETECTED
Tetrahydrocannabinol: NOT DETECTED

## 2020-12-17 LAB — ACETAMINOPHEN LEVEL: Acetaminophen (Tylenol), Serum: <10 ug/mL — ABNORMAL LOW (ref 10–30)

## 2020-12-17 NOTE — ED Provider Notes (Addendum)
[place] EMERGENCY DEPARTMENT Provider Note   CSN: [id number] Arrival date & time: 12/17/20  2707     History Chief Complaint  Patient presents with   Medical Clearance    Julia Willis is a 30 y.o. female history includes diabetes, GERD, ovarian cyst.  Patient presented today by EMS for altered mental status, she was found shaking and unable to speak but responsive to noxious stimuli.  Upon arrival to the ED and assessment in triage by provider patient had appeared calm down, she reports that she has been dealing with anxiety.  On my evaluation patient is sleeping comfortably in bed no acute distress, she is easily aroused by voice.  She reports that she has been very anxious dealing with finances and family thinks, she tells me that she does not want to talk about it at this time.  Patient reports that she otherwise feels well at this time, she reports some nausea last night when dealing with her anxiety but that has resolved.  Patient denies drug/alcohol use, suicidal/homicidal ideations, visual/auditory hallucinations, chest pain, shortness of breath, fall, injury or any additional concerns today.  HPI     Past Medical History:  Diagnosis Date   Acid reflux    Diabetes mellitus    metformin   Gonorrhea June 2013   History of pre-eclampsia in prior pregnancy, currently pregnant    Ovarian cyst    Pre-eclampsia    Thrombocytosis     Patient Active Problem List   Diagnosis Date Noted   Normal intrauterine pregnancy in third trimester 12/02/2017   Anemia 11/23/2017   Gestational diabetes requiring insulin 09/20/2017   History of cesarean section 2017-05-19   History of pre-eclampsia 05/19/2017   History of neonatal death 05/19/17   Diabetes mellitus affecting pregnancy in third trimester 02/05/2017   DM (diabetes mellitus), type 2 ([place]) 08/26/2016   UTI (urinary tract infection) in pregnancy, antepartum, second trimester 08/26/2016    Thrombocytosis 08/26/2016   Pregnancy 07/17/2016   High-risk pregnancy supervision 05/13/2012    Past Surgical History:  Procedure Laterality Date   CESAREAN SECTION N/A 02/05/2017   Procedure: CESAREAN SECTION;  Surgeon: Thurnell Lose, MD;  Location: [place];  Service: Obstetrics;  Laterality: N/A;   CESAREAN SECTION N/A 12/02/2017   Procedure: Repeat CESAREAN SECTION with Scar Revision;  Surgeon: Janyth Pupa, DO;  Location: [place];  Service: Obstetrics;  Laterality: N/A;  Heather, RNFA   NO PAST SURGERIES       OB History     Gravida  3   Para  3   Term  2   Preterm      AB  0   Living  1      SAB  0   IAB      Ectopic      Multiple  1   Live Births  2           Family History  Problem Relation Age of Onset   Diabetes Mother    Hypertension Father    Heart disease Father    Diabetes Maternal Aunt    Hypertension Maternal Aunt    Diabetes Maternal Uncle    Hypertension Maternal Uncle    Cancer Maternal Uncle    Hypertension Paternal Aunt    Hypertension Paternal Uncle     Social History   Tobacco Use   Smoking status: Never   Smokeless tobacco: Never  Vaping Use   Vaping Use: Never used  Substance Use Topics   Alcohol use: No   Drug use: No    Home Medications Prior to Admission medications   Medication Sig Start Date End Date Taking? Authorizing Provider  cetirizine-pseudoephedrine (ZYRTEC-D) 5-120 MG tablet Take 1 tablet by mouth daily. 12/12/19   Tacy Learn, PA-C  fluticasone (FLONASE) 50 MCG/ACT nasal spray Place 1 spray into both nostrils daily. 12/12/19   Tacy Learn, PA-C  ibuprofen (ADVIL,MOTRIN) 600 MG tablet Take 1 tablet (600 mg total) by mouth every 6 (six) hours as needed. 12/05/17   Everett Graff, MD  metFORMIN (GLUCOPHAGE-XR) 500 MG 24 hr tablet Take 1 tablet (500 mg total) by mouth daily. 12/06/17   Everett Graff, MD  oxyCODONE (OXY IR/ROXICODONE) 5 MG immediate release tablet Take 1  tablet (5 mg total) by mouth every 4 (four) hours as needed (pain scale 4-7). 12/05/17   Everett Graff, MD    Allergies    Patient has no known allergies.  Review of Systems   Review of Systems Ten systems are reviewed and are negative for acute change except as noted in the HPI  Physical Exam Updated Vital Signs BP (!) 143/89 (BP Location: Left Arm)   Pulse 91   Temp 98.8 F (37.1 C) (Oral)   Resp 17   SpO2 97%   Physical Exam Constitutional:      General: She is not in acute distress.    Appearance: Normal appearance. She is well-developed. She is not ill-appearing or diaphoretic.  HENT:     Head: Normocephalic and atraumatic.  Eyes:     General: Vision grossly intact. Gaze aligned appropriately.     Pupils: Pupils are equal, round, and reactive to light.  Neck:     Trachea: Trachea and phonation normal.  Pulmonary:     Effort: Pulmonary effort is normal. No respiratory distress.  Abdominal:     General: There is no distension.     Palpations: Abdomen is soft.     Tenderness: There is no abdominal tenderness. There is no guarding or rebound.  Musculoskeletal:        General: Normal range of motion.     Cervical back: Normal range of motion.  Skin:    General: Skin is warm and dry.  Neurological:     Mental Status: She is alert.     GCS: GCS eye subscore is 4. GCS verbal subscore is 5. GCS motor subscore is 6.     Comments: Speech is clear and goal oriented, follows commands Major Cranial nerves without deficit, no facial droop Moves extremities without ataxia, coordination intact Neg romberg, no pronator drift Normal gait  Psychiatric:        Attention and Perception: Attention normal. She does not perceive auditory or visual hallucinations.        Mood and Affect: Mood and affect normal. Mood is not anxious.        Speech: Speech normal.        Behavior: Behavior normal. Behavior is cooperative.        Thought Content: Thought content does not include  homicidal or suicidal ideation.        Cognition and Memory: Cognition normal.    ED Results / Procedures / Treatments   Labs (all labs ordered are listed, but only abnormal results are displayed) Labs Reviewed  COMPREHENSIVE METABOLIC PANEL - Abnormal; Notable for the following components:      Result Value   Sodium 132 (*)    Glucose, Bld  315 (*)    All other components within normal limits  SALICYLATE LEVEL - Abnormal; Notable for the following components:   Salicylate Lvl <3.5 (*)    All other components within normal limits  ACETAMINOPHEN LEVEL - Abnormal; Notable for the following components:   Acetaminophen (Tylenol), Serum <10 (*)    All other components within normal limits  RESP PANEL BY RT-PCR (FLU A&B, COVID) ARPGX2  ETHANOL  RAPID URINE DRUG SCREEN, HOSP PERFORMED  CBC WITH DIFFERENTIAL/PLATELET  I-STAT BETA HCG BLOOD, ED ([facility], [facility], [facility] ONLY)    EKG None  Radiology No results found.  Procedures Procedures   Medications Ordered in ED Medications - No data to display  ED Course  I have reviewed the triage vital signs and the nursing notes.  Pertinent labs & imaging results that were available during my care of the patient were reviewed by me and considered in my medical decision making (see chart for details).    MDM Rules/Calculators/A&P                           Additional history obtained from: Nursing notes from this visit. I reviewed patient's electronic medical records, no prior behavioral health visits.  30 year old female present by EMS for anxiety attack which has resolved.  It appears in triage patient was oriented, having some stuttering and difficulty speaking which she reported was due to anxiety.  On my initial assessment around 7 AM patient is fully alert and oriented, she reports that she has been doing with of anxiety and stress recently.  Medical clearance labs and TTS consult were placed by previous provider for evaluation of anxiety.  I  reviewed and interpreted patient's labs which include a negative pregnancy test.  Negative Tylenol and salicylate levels.  Normal CBC with differential.  Patient does have a negative alcohol level but does not appear to be in withdrawal.  Her CMP shows hyperglycemia of 315, bicarb is normal, gap is normal. No evidence for DKA.  She has no AKI, LFT elevations or emergent electrolyte derangements.  UDS is negative.  COVID/influenza panel is pending. VSS. -------- I discussed having the patient evaluated by TTS for potential inpatient help with her anxiety, patient is not interested in this.  She would rather have some outpatient resources given.  Patient reports that she does have support at home, she has a supportive boyfriend and her daughter's father can also help with childcare and is watching her now.  Patient reports that she has experienced anxiety attacks before and this felt the same and she feels better now. There is no indication for IVC of this patient at this time.  I have given the patient resources and contact information for behavioral health urgent care to help with her anxiety I encouraged her to go there today for further evaluation and treatment.  On reassessment patient againt denied any SI/HI, hallucinations, alcohol/drug use or any additional concerns.  At this time there does not appear to be any evidence of an acute emergency medical condition and the patient appears stable for discharge with appropriate outpatient follow up. Diagnosis was discussed with patient who verbalizes understanding of care plan and is agreeable to discharge. I have discussed return precautions with patient who verbalizes understanding. Patient encouraged to follow-up with their PCP. All questions answered.  Patient's case discussed with Dr. Karle Starch who agrees with plan to discharge with [facility] referral.   Note: Portions of this report may have  been transcribed using voice recognition software. Every effort was  made to ensure accuracy; however, inadvertent computerized transcription errors may still be present.  Final Clinical Impression(s) / ED Diagnoses Final diagnoses:  Anxiety    Rx / DC Orders ED Discharge Orders     None        Deliah Boston, PA-C 12/17/20 0716    Deliah Boston, PA-C 12/17/20 0723    Truddie Hidden, MD 12/17/20 [phone]

## 2020-12-17 NOTE — ED Provider Notes (Signed)
Emergency Medicine Provider Triage Evaluation Note  Julia Willis , a 30 y.o. female  was evaluated in triage.  Pt complains of anxiety.  Reports history of same, previously on medication, not currently.  Patient was found by EMS, unable to talk at the time, has since calm down.  She is stuttering and struggles to provide a history.  States that she feels safe where she lives, denies any injuries, chest pain, abdominal pain or other complaints tonight.  Patient does have a 50-year-old child who she states is with somebody else at this time and is safe.  Review of Systems  Positive: Anxiety Negative: Abdominal pain, chest pain  Physical Exam  BP (!) 143/89 (BP Location: Left Arm)   Pulse 91   Temp 98.8 F (37.1 C) (Oral)   Resp 17   SpO2 97%  Gen:   Awake, no distress   Resp:  Normal effort  MSK:   Moves extremities without difficulty  Other:  Stuttering speech, hyperventilating at times  Medical Decision Making  Medically screening exam initiated at 5:31 AM.  Appropriate orders placed.  Julia Willis was informed that the remainder of the evaluation will be completed by another provider, this initial triage assessment does not replace that evaluation, and the importance of remaining in the ED until their evaluation is complete.    Tacy Learn, PA-C 12/17/20 0543    Fatima Blank, MD 12/17/20 [phone]

## 2020-12-17 NOTE — ED Triage Notes (Addendum)
Patient from home, was found postured, shaking and unable to speak to EMS.  Patient responded to noxious stimuli.  Patient calmed and was able to speak to EMS providers, stating she needs some mental health help.  She has small child at home and she thinks she may be pregnant due to late period.  Patient does not good support at home.  Patient denies any SI or HI.

## 2020-12-17 NOTE — Discharge Instructions (Addendum)
At this time there does not appear to be the presence of an emergent medical condition, however there is always the potential for conditions to change. Please read and follow the below instructions.  Please return to the Emergency Department immediately for any new or worsening symptoms or if your symptoms return Please be sure to follow up with your Primary Care Provider within one week regarding your visit today; please call their office to schedule an appointment even if you are feeling better for a follow-up visit. Please go to the behavioral health urgent care today to talk about your anxiety and to obtain further resources. Your COVID and flu test is currently pending, you can check your results on your MyChart account later on today.  If they are positive please talk to your primary care provider.  Your blood sugar level was elevated today, please be sure to take your diabetes medications and have it rechecked with your primary care provider.  Your blood pressure was elevated today, please have this rechecked by your primary care provider.  Get help right away if: You have a racing heart and shortness of breath. You have thoughts of hurting yourself or others. If you ever feel like you may hurt yourself or others, or have thoughts about taking your own life, get help right away. Go to your nearest emergency department or: Call your local emergency services (911 in the [place] Call a suicide crisis helpline, such as the [facility] at 682-532-6325 or 988 in the [place]. This is open 24 hours a day in the [place] Text the Crisis Text Line at (216)131-5663 (in the [place].).  Please read the additional information packets attached to your discharge summary.  Do not take your medicine if  develop an itchy rash, swelling in your mouth or lips, or difficulty breathing; call 911 and seek immediate emergency medical attention if this occurs.  You may review your lab tests and imaging  results in their entirety on your MyChart account.  Please discuss all results of fully with your primary care provider and other specialist at your follow-up visit.  Note: Portions of this text may have been transcribed using voice recognition software. Every effort was made to ensure accuracy; however, inadvertent computerized transcription errors may still be present.

## 2021-04-16 ENCOUNTER — Encounter (HOSPITAL_COMMUNITY): Payer: Self-pay

## 2021-04-16 ENCOUNTER — Other Ambulatory Visit: Payer: Self-pay

## 2021-04-16 ENCOUNTER — Emergency Department (HOSPITAL_COMMUNITY)
Admission: EM | Admit: 2021-04-16 | Discharge: 2021-04-16 | Disposition: A | Discharge status: Home / Self Care | Payer: BC Managed Care – PPO | Attending: Emergency Medicine | Admitting: Emergency Medicine

## 2021-04-16 DIAGNOSIS — R202 Paresthesia of skin: Secondary | ICD-10-CM | POA: Diagnosis not present

## 2021-04-16 DIAGNOSIS — Z7984 Long term (current) use of oral hypoglycemic drugs: Secondary | ICD-10-CM | POA: Diagnosis not present

## 2021-04-16 DIAGNOSIS — E1165 Type 2 diabetes mellitus with hyperglycemia: Secondary | ICD-10-CM | POA: Diagnosis not present

## 2021-04-16 DIAGNOSIS — M79642 Pain in left hand: Secondary | ICD-10-CM | POA: Insufficient documentation

## 2021-04-16 DIAGNOSIS — R5383 Other fatigue: Secondary | ICD-10-CM | POA: Diagnosis not present

## 2021-04-16 DIAGNOSIS — M79641 Pain in right hand: Secondary | ICD-10-CM | POA: Insufficient documentation

## 2021-04-16 DIAGNOSIS — Z794 Long term (current) use of insulin: Secondary | ICD-10-CM | POA: Diagnosis not present

## 2021-04-16 LAB — BASIC METABOLIC PANEL
Anion gap: 9 (ref 5–15)
BUN: 6 mg/dL (ref 6–20)
CO2: 25 mmol/L (ref 22–32)
Calcium: 9.3 mg/dL (ref 8.9–10.3)
Chloride: 97 mmol/L — ABNORMAL LOW (ref 98–111)
Creatinine, Ser: 0.55 mg/dL (ref 0.44–1.00)
GFR, Estimated: >60 mL/min (ref >60)
Glucose, Bld: 401 mg/dL — ABNORMAL HIGH (ref 70–99)
Potassium: 3.7 mmol/L (ref 3.5–5.1)
Sodium: 131 mmol/L — ABNORMAL LOW (ref 135–145)

## 2021-04-16 LAB — URINALYSIS, ROUTINE W REFLEX MICROSCOPIC
Bilirubin Urine: NEGATIVE
Glucose, UA: >=500 mg/dL — AB
Ketones, ur: 5 mg/dL — AB
Nitrite: NEGATIVE
Protein, ur: NEGATIVE mg/dL
Specific Gravity, Urine: 1.03 (ref 1.005–1.030)
WBC, UA: >50 WBC/hpf — ABNORMAL HIGH (ref 0–5)
pH: 6 (ref 5.0–8.0)

## 2021-04-16 LAB — I-STAT BETA HCG BLOOD, ED (MC, WL, AP ONLY): I-stat hCG, quantitative: <5 m[IU]/mL (ref <5)

## 2021-04-16 LAB — CBG MONITORING, ED
Glucose-Capillary: 371 mg/dL — ABNORMAL HIGH (ref 70–99)
Glucose-Capillary: 334 mg/dL — ABNORMAL HIGH (ref 70–99)

## 2021-04-16 LAB — CBC WITH DIFFERENTIAL/PLATELET
Abs Immature Granulocytes: 0.01 10*3/uL (ref 0.00–0.07)
Basophils Absolute: 0 10*3/uL (ref 0.0–0.1)
Basophils Relative: 0 %
Eosinophils Absolute: 0 10*3/uL (ref 0.0–0.5)
Eosinophils Relative: 1 %
HCT: 40.8 % (ref 36.0–46.0)
Hemoglobin: 13.3 g/dL (ref 12.0–15.0)
Immature Granulocytes: 0 %
Lymphocytes Relative: 34 %
Lymphs Abs: 1.8 10*3/uL (ref 0.7–4.0)
MCH: 27.9 pg (ref 26.0–34.0)
MCHC: 32.6 g/dL (ref 30.0–36.0)
MCV: 85.5 fL (ref 80.0–100.0)
Monocytes Absolute: 0.2 10*3/uL (ref 0.1–1.0)
Monocytes Relative: 4 %
Neutro Abs: 3.3 10*3/uL (ref 1.7–7.7)
Neutrophils Relative %: 61 %
Platelets: 396 10*3/uL (ref 150–400)
RBC: 4.77 MIL/uL (ref 3.87–5.11)
RDW: 12.8 % (ref 11.5–15.5)
WBC: 5.4 10*3/uL (ref 4.0–10.5)
nRBC: 0 % (ref 0.0–0.2)

## 2021-04-16 MED ORDER — BLOOD GLUCOSE MONITOR KIT
PACK | 1 refills | Status: AC
Start: 2021-04-16 — End: ?

## 2021-04-16 MED ORDER — LACTATED RINGERS IV BOLUS
1000.0000 mL | Freq: Once | INTRAVENOUS | Status: AC
  Administered 2021-04-16: 1000 mL via INTRAVENOUS

## 2021-04-16 NOTE — Discharge Instructions (Addendum)
As we discussed, it is very important that you maintain adequate control of your blood sugar for your overall health.  I suspect that that is the cause of your symptoms today.  I have given you a prescription for a blood glucose meter kit and supplies for you to check your sugars regularly.  You will need to establish care with a primary care doctor as soon as possible for continued evaluation and management of this.  In the interim, I recommend low sugar diet with at least 30 minutes of daily exercise. ? ?Return if development of any new or worsening symptoms ?

## 2021-04-16 NOTE — ED Provider Notes (Signed)
?[facility] ?Provider Note ? ? ?CSN: [id number] ?Arrival date & time: 04/16/21  1324 ? ?  ? ?History ? ?Chief Complaint  ?Patient presents with  ? Fatigue  ? Numbness  ? ? ?Julia Willis is a 31 y.o. female. ? ?Patient with history of diabetes presents today with complaints of fatigue and tingling in her bilateral hands.  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 She states that the tingling is isolated to the bilateral palms and into her fingers. She also endorses associated pain in both of her hands. She states that she thought she might just be tired from working all week, however states when she woke up this morning she felt worse with associated body aches.  She states that she is on metformin and insulin for her diabetes and takes this every day as prescribed but does not check her sugar regularly and has not checked it today.  States that it has been 'a long time' since she checked her sugar as she moved recently and has been unable to locate her testing supplies since her move.  She states that she also lost contact with a primary care doctor following her move.  States that her PCP had referred her to endocrinology before she moved but she was unable to attend the appointment because she could not afford the copay. States that she has had similar tingling sensations in her feet but never in her hands before. She denies fevers, chills, nausea, vomiting, diarrhea, or abdominal pain. ? ?The history is provided by the patient. No language interpreter was used.  ? ?  ? ?Home Medications ?Prior to Admission medications   ?Medication Sig Start Date End Date Taking? Authorizing Provider  ?cetirizine-pseudoephedrine (ZYRTEC-D) 5-120 MG tablet Take 1 tablet by mouth daily. 12/12/19   Tacy Learn, PA-C  ?fluticasone (FLONASE) 50 MCG/ACT nasal spray Place 1 spray into both nostrils daily.  12/12/19   Tacy Learn, PA-C  ?ibuprofen (ADVIL,MOTRIN) 600 MG tablet Take 1 tablet (600 mg total) by mouth every 6 (six) hours as needed. 12/05/17   Everett Graff, MD  ?metFORMIN (GLUCOPHAGE-XR) 500 MG 24 hr tablet Take 1 tablet (500 mg total) by mouth daily. 12/06/17   Everett Graff, MD  ?oxyCODONE (OXY IR/ROXICODONE) 5 MG immediate release tablet Take 1 tablet (5 mg total) by mouth every 4 (four) hours as needed (pain scale 4-7). 12/05/17   Everett Graff, MD  ?   ? ?Allergies    ?Patient has no known allergies.   ? ?Review of Systems   ?Review of Systems  ?Constitutional:  Negative for chills and fever.  ?Respiratory:  Negative for shortness of breath.   ?Cardiovascular:  Negative for chest pain.  ?Gastrointestinal:  Negative for abdominal pain, diarrhea, nausea and vomiting.  ?Endocrine: Positive for polyuria.  ?Genitourinary:  Negative for decreased urine volume, difficulty urinating, dyspareunia, dysuria, hematuria, pelvic pain, vaginal bleeding, vaginal discharge and vaginal pain.  ?Musculoskeletal:  Positive for arthralgias and myalgias. Negative for joint swelling, neck pain and neck stiffness.  ?Skin:  Negative for rash and wound.  ?Neurological:  Negative for dizziness, tremors, seizures, syncope, facial asymmetry, speech difficulty, weakness, light-headedness, numbness and headaches.  ?Psychiatric/Behavioral:  Negative for confusion and decreased concentration.   ?All other systems reviewed and are negative. ? ?Physical Exam ?Updated Vital Signs ?BP (!) 158/119   Pulse 78   Temp 98 ?F (36.7 ?  C) (Oral)   Resp 18   SpO2 98%  ?Physical Exam ?Vitals and nursing note reviewed.  ?Constitutional:   ?   General: She is not in acute distress. ?   Appearance: Normal appearance. She is normal weight. She is not ill-appearing, toxic-appearing or diaphoretic.  ?   Comments: Patient resting comfortably in bed in no acute distress  ?HENT:  ?   Head: Normocephalic and atraumatic.  ?Eyes:  ?   Extraocular  Movements: Extraocular movements intact.  ?   Pupils: Pupils are equal, round, and reactive to light.  ?Cardiovascular:  ?   Rate and Rhythm: Normal rate and regular rhythm.  ?   Heart sounds: Normal heart sounds.  ?Pulmonary:  ?   Effort: Pulmonary effort is normal. No respiratory distress.  ?   Breath sounds: Normal breath sounds.  ?Abdominal:  ?   General: Abdomen is flat.  ?   Palpations: Abdomen is soft.  ?   Tenderness: There is no abdominal tenderness.  ?Musculoskeletal:     ?   General: Normal range of motion.  ?   Cervical back: Normal range of motion.  ?   Comments: 5/5 strength in bilateral upper and lower extremities. Capillary refill less than 2 seconds  ?Skin: ?   General: Skin is warm and dry.  ?Neurological:  ?   General: No focal deficit present.  ?   Mental Status: She is alert and oriented to person, place, and time.  ?   GCS: GCS eye subscore is 4. GCS verbal subscore is 5. GCS motor subscore is 6.  ?   Sensory: Sensation is intact.  ?   Motor: Motor function is intact.  ?   Coordination: Coordination is intact.  ?   Gait: Gait is intact.  ?   Comments: Alert and oriented to self, place, time and event.  ?  ?Speech is fluent, clear without dysarthria or dysphasia.  ?  ?Strength 5/5 in upper/lower extremities   ?Sensation intact in upper/lower extremities  ?  ?CN I not tested  ?CN II grossly intact visual fields bilaterally. Did not visualize posterior eye.  ?CN III, IV, VI PERRLA and EOMs intact bilaterally  ?CN V Intact sensation to sharp and light touch to the face  ?CN VII facial movements symmetric  ?CN VIII not tested  ?CN IX, X no uvula deviation, symmetric rise of soft palate  ?CN XI 5/5 SCM and trapezius strength bilaterally  ?CN XII Midline tongue protrusion, symmetric L/R movements   ?Psychiatric:     ?   Mood and Affect: Mood normal.     ?   Behavior: Behavior normal.  ? ? ?ED Results / Procedures / Treatments   ?Labs ?(all labs ordered are listed, but only abnormal results are  displayed) ?Labs Reviewed  ?BASIC METABOLIC PANEL - Abnormal; Notable for the following components:  ?    Result Value  ? Sodium 131 (*)   ? Chloride 97 (*)   ? Glucose, Bld 401 (*)   ? All other components within normal limits  ?URINALYSIS, ROUTINE W REFLEX MICROSCOPIC - Abnormal; Notable for the following components:  ? APPearance HAZY (*)   ? Glucose, UA >=500 (*)   ? Hgb urine dipstick SMALL (*)   ? Ketones, ur 5 (*)   ? Leukocytes,Ua MODERATE (*)   ? WBC, UA >50 (*)   ? Bacteria, UA FEW (*)   ? All other components within normal limits  ?CBG MONITORING, ED -  Abnormal; Notable for the following components:  ? Glucose-Capillary 371 (*)   ? All other components within normal limits  ?CBC WITH DIFFERENTIAL/PLATELET  ?I-STAT BETA HCG BLOOD, ED ([facility], [facility], [facility] ONLY)  ?CBG MONITORING, ED  ? ? ?EKG ?None ? ?Radiology ?No results found. ? ?Procedures ?Procedures  ? ? ?Medications Ordered in ED ?Medications  ?lactated ringers bolus 1,000 mL (has no administration in time range)  ? ? ?ED Course/ Medical Decision Making/ A&P ?  ?                        ?Medical Decision Making ?Amount and/or Complexity of Data Reviewed ?Labs: ordered. ? ? ? ?This patient presents to the ED for concern of fatigue, tingling in hands, this involves an extensive number of treatment options, and is a complaint that carries with it a high risk of complications and morbidity. ? ? ?Co morbidities that complicate the patient evaluation ? ?Poorly controlled diabetes ? ? ?Additional history obtained: ? ?Additional history obtained from previous ER notes ? ? ?Lab Tests: ? ?I Ordered, and personally interpreted labs.  The pertinent results include: No leukocytosis or anemia. Glucose 401.  UA with WBCs, leukocytes, ketones, greater than 500 glucose ? ? ?Problem List / ED Course / Critical interventions / Medication management ? ?I ordered medication including fluids for hyperglycemia  ?Reevaluation of the patient after these medicines showed that the  patient improved ?I have reviewed the patients home medicines and have made adjustments as needed ? ? ?Patient presents today with tingling in bilateral hands with associated feeling overall poorly for the past day.  She is a

## 2021-04-16 NOTE — ED Triage Notes (Signed)
Pt c/o worsening malaise and fatigue since yesterday. States she also woke up this morning with paresthesias in her hands. Pt has hx of T2DM, on metformin daily and long-acting insulin at night. Has not checked her sugar today. No fevers, cough, N/V/D.  ?

## 2021-05-08 ENCOUNTER — Encounter (HOSPITAL_COMMUNITY): Payer: Self-pay | Admitting: Emergency Medicine

## 2021-05-08 ENCOUNTER — Other Ambulatory Visit: Payer: Self-pay

## 2021-05-08 ENCOUNTER — Emergency Department (HOSPITAL_COMMUNITY)
Admission: EM | Admit: 2021-05-08 | Discharge: 2021-05-08 | Disposition: A | Discharge status: Home / Self Care | Payer: BC Managed Care – PPO | Attending: Emergency Medicine | Admitting: Emergency Medicine

## 2021-05-08 DIAGNOSIS — E119 Type 2 diabetes mellitus without complications: Secondary | ICD-10-CM | POA: Diagnosis not present

## 2021-05-08 DIAGNOSIS — Z719 Counseling, unspecified: Secondary | ICD-10-CM

## 2021-05-08 DIAGNOSIS — Z7984 Long term (current) use of oral hypoglycemic drugs: Secondary | ICD-10-CM | POA: Insufficient documentation

## 2021-05-08 DIAGNOSIS — E1165 Type 2 diabetes mellitus with hyperglycemia: Secondary | ICD-10-CM | POA: Diagnosis not present

## 2021-05-08 DIAGNOSIS — Z7189 Other specified counseling: Secondary | ICD-10-CM | POA: Insufficient documentation

## 2021-05-08 DIAGNOSIS — Z202 Contact with and (suspected) exposure to infections with a predominantly sexual mode of transmission: Secondary | ICD-10-CM | POA: Insufficient documentation

## 2021-05-08 MED ORDER — VALACYCLOVIR HCL 500 MG PO TABS
500.0000 mg | ORAL_TABLET | Freq: Every day | ORAL | 2 refills | Status: AC
Start: 2021-05-08 — End: ?

## 2021-05-08 NOTE — Discharge Instructions (Addendum)
Follow up with primary care or gynecology. ?Take 500 mg daily of Valtrex to help suppress outbreaks. ? ?

## 2021-05-08 NOTE — ED Provider Notes (Signed)
?[facility] ?Provider Note ? ? ?CSN: [id number] ?Arrival date & time: 05/08/21  0005 ? ?  ? ?History ? ?Chief Complaint  ?Patient presents with  ? Exposure to STD  ? ? ?Julia Willis is a 31 y.o. female who presents for health education.  Patient states that her partner recently developed a small blistering rash on his penis.  He has been accusing her of sleeping with other people.  She states that about 10 years ago she came in for an evaluation was told that she could have it but was not sure if she did.  She states she has had children since that time and it has not come up.  She wants to know if I could educate her on herpes.  She has no other complaints at this time.  She states that she has not had any known outbreaks but has had some yeast infections secondary to her type 2 diabetes. ? ? ?Exposure to STD ? ? ?  ? ?Home Medications ?Prior to Admission medications   ?Medication Sig Start Date End Date Taking? Authorizing Provider  ?blood glucose meter kit and supplies KIT Dispense based on patient and insurance preference. Use up to four times daily as directed. 04/16/21   Smoot, Sarah A, PA-C  ?cetirizine-pseudoephedrine (ZYRTEC-D) 5-120 MG tablet Take 1 tablet by mouth daily. 12/12/19   Tacy Learn, PA-C  ?fluticasone (FLONASE) 50 MCG/ACT nasal spray Place 1 spray into both nostrils daily. 12/12/19   Tacy Learn, PA-C  ?ibuprofen (ADVIL,MOTRIN) 600 MG tablet Take 1 tablet (600 mg total) by mouth every 6 (six) hours as needed. 12/05/17   Everett Graff, MD  ?metFORMIN (GLUCOPHAGE-XR) 500 MG 24 hr tablet Take 1 tablet (500 mg total) by mouth daily. 12/06/17   Everett Graff, MD  ?oxyCODONE (OXY IR/ROXICODONE) 5 MG immediate release tablet Take 1 tablet (5 mg total) by mouth every 4 (four) hours as needed (pain scale 4-7). 12/05/17   Everett Graff, MD  ?   ? ?Allergies    ?Patient has no known allergies.   ? ?Review of Systems   ?Review of Systems ? ?Physical  Exam ?Updated Vital Signs ?BP (!) 170/98   Pulse 91   Temp 98.2 ?F (36.8 ?C) (Oral)   Resp 15   SpO2 96%  ?Physical Exam ?Vitals and nursing note reviewed.  ?Constitutional:   ?   General: She is not in acute distress. ?   Appearance: She is well-developed. She is not diaphoretic.  ?   Comments: tearful  ?HENT:  ?   Head: Normocephalic and atraumatic.  ?   Right Ear: External ear normal.  ?   Left Ear: External ear normal.  ?   Nose: Nose normal.  ?   Mouth/Throat:  ?   Mouth: Mucous membranes are moist.  ?Eyes:  ?   General: No scleral icterus. ?   Conjunctiva/sclera: Conjunctivae normal.  ?Cardiovascular:  ?   Rate and Rhythm: Normal rate and regular rhythm.  ?   Heart sounds: Normal heart sounds. No murmur heard. ?  No friction rub. No gallop.  ?Pulmonary:  ?   Effort: Pulmonary effort is normal. No respiratory distress.  ?   Breath sounds: Normal breath sounds.  ?Abdominal:  ?   General: Bowel sounds are normal. There is no distension.  ?   Palpations: Abdomen is soft. There is no mass.  ?   Tenderness: There is no abdominal tenderness. There is no guarding.  ?  Musculoskeletal:  ?   Cervical back: Normal range of motion.  ?Skin: ?   General: Skin is warm and dry.  ?Neurological:  ?   Mental Status: She is alert and oriented to person, place, and time.  ?Psychiatric:     ?   Behavior: Behavior normal.  ? ? ?ED Results / Procedures / Treatments   ?Labs ?(all labs ordered are listed, but only abnormal results are displayed) ?Labs Reviewed - No data to display ? ?EKG ?None ? ?Radiology ?No results found. ? ?Procedures ?Procedures  ? ? ?Medications Ordered in ED ?Medications - No data to display ? ?ED Course/ Medical Decision Making/ A&P ?  ?                        ?Medical Decision Making ? ? ?PersonallyI personally performed the services described in this documentation, which was scribed in my presence. The recorded information has been reviewed and is accurate.  ?In EMR.  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  She possibly could have passed this to her partner.  He may have had it for some time and is just having a outbreak for the first time.  I educated her partner by phone who was having difficulty listening.  I advised the patient that she could be given suppressive therapy with Valtrex 500 daily.  I have given her prescription for this medication.  She will follow-up with PCP or GYN.  She has no other complaints at this time ?Final Clinical Impression(s) / ED Diagnoses ?Final diagnoses:  ?Health education/counseling  ? ? ?Rx / DC Orders ?ED Discharge Orders   ? ? None  ? ?  ? ? ?  ?Margarita Mail, PA-C ?05/08/21 0881 ? ?  ?Isla Pence, MD ?05/08/21 1613 ? ?

## 2021-05-08 NOTE — ED Notes (Signed)
Patient to room 26. Patient reports her partner has a breakout of genital lesions and she would like to have std testing. Patient reports history of Herpes Type 2 virus ten years ago but denies having any known active lesions since that time. Patient denies any discharge or painful intercourse at this time. Patient is alert and oriented and calm. Call bell in reach.  ?

## 2021-05-08 NOTE — ED Triage Notes (Signed)
Pt reported to ED to be tested for STDs. States her partner of 7 years had a "outbreak on his private parts" and accused her of giving it to him. States she would like to be tested for genital herpes.  ?

## 2021-05-20 DIAGNOSIS — Z124 Encounter for screening for malignant neoplasm of cervix: Secondary | ICD-10-CM | POA: Diagnosis not present

## 2021-05-20 DIAGNOSIS — E1165 Type 2 diabetes mellitus with hyperglycemia: Secondary | ICD-10-CM | POA: Diagnosis not present

## 2021-05-20 DIAGNOSIS — N898 Other specified noninflammatory disorders of vagina: Secondary | ICD-10-CM | POA: Diagnosis not present

## 2021-06-23 DIAGNOSIS — E782 Mixed hyperlipidemia: Secondary | ICD-10-CM | POA: Diagnosis not present

## 2021-06-23 DIAGNOSIS — Z0000 Encounter for general adult medical examination without abnormal findings: Secondary | ICD-10-CM | POA: Diagnosis not present

## 2021-06-23 DIAGNOSIS — Z6831 Body mass index (BMI) 31.0-31.9, adult: Secondary | ICD-10-CM | POA: Diagnosis not present

## 2021-06-23 DIAGNOSIS — E1165 Type 2 diabetes mellitus with hyperglycemia: Secondary | ICD-10-CM | POA: Diagnosis not present

## 2021-11-17 DIAGNOSIS — E1165 Type 2 diabetes mellitus with hyperglycemia: Secondary | ICD-10-CM | POA: Diagnosis not present

## 2021-11-17 DIAGNOSIS — Z794 Long term (current) use of insulin: Secondary | ICD-10-CM | POA: Diagnosis not present

## 2022-01-05 DIAGNOSIS — R809 Proteinuria, unspecified: Secondary | ICD-10-CM | POA: Diagnosis not present

## 2022-01-05 DIAGNOSIS — E1165 Type 2 diabetes mellitus with hyperglycemia: Secondary | ICD-10-CM | POA: Diagnosis not present

## 2022-01-05 DIAGNOSIS — Z794 Long term (current) use of insulin: Secondary | ICD-10-CM | POA: Diagnosis not present
